# Patient Record
Sex: Male | Born: 1945 | Race: White | Hispanic: No | Smoking: Never smoker
Health system: Southern US, Community
[De-identification: ages and names within clinical notes are randomized; demographics above are authoritative.]

## PROBLEM LIST (undated history)

## (undated) DIAGNOSIS — Z87442 Personal history of urinary calculi: Secondary | ICD-10-CM

## (undated) DIAGNOSIS — S32009A Unspecified fracture of unspecified lumbar vertebra, initial encounter for closed fracture: Secondary | ICD-10-CM

## (undated) DIAGNOSIS — E559 Vitamin D deficiency, unspecified: Secondary | ICD-10-CM

## (undated) DIAGNOSIS — Z8739 Personal history of other diseases of the musculoskeletal system and connective tissue: Secondary | ICD-10-CM

## (undated) DIAGNOSIS — R030 Elevated blood-pressure reading, without diagnosis of hypertension: Secondary | ICD-10-CM

## (undated) DIAGNOSIS — E119 Type 2 diabetes mellitus without complications: Secondary | ICD-10-CM

## (undated) DIAGNOSIS — E785 Hyperlipidemia, unspecified: Secondary | ICD-10-CM

## (undated) HISTORY — DX: Personal history of other diseases of the musculoskeletal system and connective tissue: Z87.39

## (undated) HISTORY — DX: Hyperlipidemia, unspecified: E78.5

## (undated) HISTORY — DX: Type 2 diabetes mellitus without complications: E11.9

## (undated) HISTORY — PX: OTHER SURGICAL HISTORY: SHX169

## (undated) HISTORY — DX: Unspecified fracture of unspecified lumbar vertebra, initial encounter for closed fracture: S32.009A

## (undated) HISTORY — DX: Elevated blood-pressure reading, without diagnosis of hypertension: R03.0

## (undated) HISTORY — DX: Personal history of urinary calculi: Z87.442

## (undated) HISTORY — DX: Vitamin D deficiency, unspecified: E55.9

---

## 1992-11-05 HISTORY — PX: OTHER SURGICAL HISTORY: SHX169

## 2009-09-30 LAB — HEPATIC FUNCTION PANEL: Alkaline Phosphatase: 72 U/L (ref 25–125)

## 2009-09-30 LAB — LIPID PANEL
Cholesterol: 133 mg/dL (ref 0–200)
LDL Cholesterol: 80 mg/dL
Triglycerides: 88 mg/dL (ref 40–160)

## 2009-09-30 LAB — BASIC METABOLIC PANEL: Glucose: 116 mg/dL

## 2009-09-30 LAB — HEMOGLOBIN A1C: Hgb A1c MFr Bld: 6.5 % — AB (ref 4.0–6.0)

## 2010-11-05 HISTORY — PX: GALLBLADDER SURGERY: SHX652

## 2012-12-12 ENCOUNTER — Ambulatory Visit (INDEPENDENT_AMBULATORY_CARE_PROVIDER_SITE_OTHER): Payer: BC Managed Care – PPO | Admitting: Family Medicine

## 2012-12-12 ENCOUNTER — Encounter: Payer: Self-pay | Admitting: Family Medicine

## 2012-12-12 VITALS — BP 175/94 | HR 71 | Ht 72.0 in | Wt 232.0 lb

## 2012-12-12 DIAGNOSIS — IMO0001 Reserved for inherently not codable concepts without codable children: Secondary | ICD-10-CM

## 2012-12-12 DIAGNOSIS — H60399 Other infective otitis externa, unspecified ear: Secondary | ICD-10-CM

## 2012-12-12 DIAGNOSIS — R030 Elevated blood-pressure reading, without diagnosis of hypertension: Secondary | ICD-10-CM

## 2012-12-12 DIAGNOSIS — H6090 Unspecified otitis externa, unspecified ear: Secondary | ICD-10-CM

## 2012-12-12 DIAGNOSIS — E785 Hyperlipidemia, unspecified: Secondary | ICD-10-CM

## 2012-12-12 DIAGNOSIS — H6120 Impacted cerumen, unspecified ear: Secondary | ICD-10-CM

## 2012-12-12 HISTORY — DX: Reserved for inherently not codable concepts without codable children: IMO0001

## 2012-12-12 HISTORY — DX: Hyperlipidemia, unspecified: E78.5

## 2012-12-12 MED ORDER — HYDROCORTISONE-ACETIC ACID 1-2 % OT SOLN: 4.0000 [drp] | Freq: Three times a day (TID) | OTIC | Status: DC

## 2012-12-12 NOTE — Progress Notes (Signed)
CC: Kevin Wood is a 67 y.o. male is here for Establish Care and check right ear   Subjective: HPI:  Kevin Wood 67 year old here to establish care, also receives care at local [place].  Complains of right ear pain present for a little over one week. Associated with mild hearing loss and itching. Feels identical to prior episodes of cerumen impactions. Has treated at home with peroxide and Q-tips. Nothing has made it better or worse. He denies discharge or external ear pain. Denies recent or remote trauma.     Review of Systems - General ROS: negative for - chills, fever, night sweats, weight gain or weight loss Ophthalmic ROS: negative for - decreased vision Psychological ROS: negative for - anxiety or depression ENT ROS: negative for - hearing change, nasal congestion, tinnitus or allergies Hematological and Lymphatic ROS: negative for - bleeding problems, bruising or swollen lymph nodes Breast ROS: negative Respiratory ROS: no cough, shortness of breath, or wheezing Cardiovascular ROS: no chest pain or dyspnea on exertion Gastrointestinal ROS: no abdominal pain, change in bowel habits, or black or bloody stools Genito-Urinary ROS: negative for - genital discharge, genital ulcers, incontinence or abnormal bleeding from genitals Musculoskeletal ROS: negative for - joint pain or muscle pain Neurological ROS: negative for - headaches or memory loss Dermatological ROS: negative for lumps, mole changes, rash and skin lesion changes  Past Medical History  Diagnosis Date  . Hyperlipidemia 12/12/2012     History reviewed. No pertinent family history.   History  Substance Use Topics  . Smoking status: Never Smoker   . Smokeless tobacco: Not on file  . Alcohol Use: 0.5 oz/week    1 drink(s) per week     Objective: Filed Vitals:   12/12/12 1444  BP: 175/94  Pulse: 71    General: Alert and Oriented, No Acute Distress HEENT: Pupils equal, round, reactive to light. Conjunctivae clear.   External ears unremarkable,  smaller than normal external auditory canal bilaterally with cerumen impaction bilaterally.Moist mucous membranes, pharynx without inflammation nor lesions.  Neck supple without palpable lymphadenopathy nor abnormal masses. Lungs: Clear to auscultation bilaterally, no wheezing/ronchi/rales.  Comfortable work of breathing. Good air movement. Cardiac: Regular rate and rhythm. Normal S1/S2.  No murmurs, rubs, nor gallops.   Extremities: No peripheral edema.  Strong peripheral pulses.  Mental Status: No depression, anxiety, nor agitation. Skin: Warm and dry.  Assessment & Plan: Kiam was seen today for establish care and check right ear.  Diagnoses and associated orders for this visit:  Elevated blood pressure  Otitis externa - acetic acid-hydrocortisone (VOSOL-HC) otic solution; Place 4 drops into both ears 3 (three) times daily.  Other Orders - simvastatin (ZOCOR) 10 MG tablet; Take 10 mg by mouth at bedtime. - niacin 500 MG tablet; Take 500 mg by mouth daily with breakfast.    Elevated blood pressure:  Patient has a blood pressure cuff at home and was instructed to measure blood pressures daily and is consistently above 140/90 return as soon as possible for consideration of blood pressure medication. Return in one month and if still elevated will consider starting hydrochlorothiazide.  Otitis externa with cerumen impaction: Moderate amount of cerumen was removed from the ear using water irrigation followed by removal with a curet by myself bilaterally. Eardrums were visualized bilaterally however right side has mild cerumen attached to the tympanic membrane. Patient tolerated procedure well and reports increased ability to hear. Will start above otic solution followed by 3 times a week hydrocortisone and water washings.  asked him to return with any recent blood work from the [place] at his next visit.  No Follow-up on file.

## 2012-12-17 ENCOUNTER — Encounter: Payer: Self-pay | Admitting: *Deleted

## 2012-12-17 ENCOUNTER — Encounter: Payer: Self-pay | Admitting: Family Medicine

## 2012-12-17 DIAGNOSIS — Z87442 Personal history of urinary calculi: Secondary | ICD-10-CM

## 2012-12-17 DIAGNOSIS — Z800 Family history of malignant neoplasm of digestive organs: Secondary | ICD-10-CM | POA: Insufficient documentation

## 2012-12-17 DIAGNOSIS — E559 Vitamin D deficiency, unspecified: Secondary | ICD-10-CM

## 2012-12-17 DIAGNOSIS — E1129 Type 2 diabetes mellitus with other diabetic kidney complication: Secondary | ICD-10-CM | POA: Insufficient documentation

## 2012-12-17 DIAGNOSIS — H6120 Impacted cerumen, unspecified ear: Secondary | ICD-10-CM

## 2012-12-17 DIAGNOSIS — Z8739 Personal history of other diseases of the musculoskeletal system and connective tissue: Secondary | ICD-10-CM

## 2012-12-17 DIAGNOSIS — D214 Benign neoplasm of connective and other soft tissue of abdomen: Secondary | ICD-10-CM | POA: Insufficient documentation

## 2012-12-17 DIAGNOSIS — E119 Type 2 diabetes mellitus without complications: Secondary | ICD-10-CM

## 2012-12-17 HISTORY — DX: Personal history of other diseases of the musculoskeletal system and connective tissue: Z87.39

## 2012-12-17 HISTORY — DX: Vitamin D deficiency, unspecified: E55.9

## 2012-12-17 HISTORY — DX: Personal history of urinary calculi: Z87.442

## 2012-12-17 HISTORY — DX: Type 2 diabetes mellitus without complications: E11.9

## 2012-12-17 LAB — MICROALBUMIN / CREATININE URINE RATIO: .: 16.6

## 2012-12-17 LAB — PSA: .: 0.4

## 2013-07-02 ENCOUNTER — Encounter: Payer: Self-pay | Admitting: Family Medicine

## 2013-07-02 ENCOUNTER — Ambulatory Visit (INDEPENDENT_AMBULATORY_CARE_PROVIDER_SITE_OTHER): Payer: Medicare Other | Admitting: Family Medicine

## 2013-07-02 VITALS — BP 136/82 | HR 68 | Wt 236.0 lb

## 2013-07-02 DIAGNOSIS — Z23 Encounter for immunization: Secondary | ICD-10-CM

## 2013-07-02 DIAGNOSIS — L909 Atrophic disorder of skin, unspecified: Secondary | ICD-10-CM

## 2013-07-02 DIAGNOSIS — L918 Other hypertrophic disorders of the skin: Secondary | ICD-10-CM

## 2013-07-02 DIAGNOSIS — L089 Local infection of the skin and subcutaneous tissue, unspecified: Secondary | ICD-10-CM
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
## 2013-08-11 ENCOUNTER — Telehealth: Payer: Self-pay | Admitting: Family Medicine

## 2013-08-11 ENCOUNTER — Ambulatory Visit (INDEPENDENT_AMBULATORY_CARE_PROVIDER_SITE_OTHER): Payer: Medicare Other | Admitting: Family Medicine

## 2013-08-11 ENCOUNTER — Encounter: Payer: Self-pay | Admitting: Family Medicine

## 2013-08-11 VITALS — BP 145/81 | HR 69 | Wt 235.0 lb

## 2013-08-11 DIAGNOSIS — Z79899 Other long term (current) drug therapy: Secondary | ICD-10-CM

## 2013-08-11 DIAGNOSIS — E785 Hyperlipidemia, unspecified: Secondary | ICD-10-CM

## 2013-08-11 DIAGNOSIS — I10 Essential (primary) hypertension: Secondary | ICD-10-CM | POA: Insufficient documentation

## 2013-08-11 DIAGNOSIS — Z5181 Encounter for therapeutic drug level monitoring: Secondary | ICD-10-CM

## 2013-08-11 DIAGNOSIS — E119 Type 2 diabetes mellitus without complications: Secondary | ICD-10-CM

## 2013-08-11 LAB — LIPID PANEL
Cholesterol: 186 mg/dL (ref 0–200)
LDL Cholesterol: 124 mg/dL — ABNORMAL HIGH (ref 0–99)
Triglycerides: 107 mg/dL (ref <150)

## 2013-08-11 LAB — COMPLETE METABOLIC PANEL WITH GFR
ALT: 20 U/L (ref 0–53)
Albumin: 4.3 g/dL (ref 3.5–5.2)
CO2: 31 mEq/L (ref 19–32)
Calcium: 9.4 mg/dL (ref 8.4–10.5)
Chloride: 101 mEq/L (ref 96–112)
Creat: 0.83 mg/dL (ref 0.50–1.35)
GFR, Est African American: >89 mL/min
Potassium: 4.5 mEq/L (ref 3.5–5.3)

## 2013-08-11 LAB — HEMOGLOBIN A1C: Hgb A1c MFr Bld: 6.5 % — ABNORMAL HIGH (ref <5.7)

## 2013-08-11 MED ORDER — LISINOPRIL-HYDROCHLOROTHIAZIDE 20-12.5 MG PO TABS: 1.0000 | ORAL_TABLET | Freq: Every day | ORAL | Status: DC

## 2013-08-11 MED ORDER — SIMVASTATIN 20 MG PO TABS: 20.0000 mg | ORAL_TABLET | Freq: Every day | ORAL | Status: DC

## 2013-08-11 NOTE — Telephone Encounter (Signed)
Kevin Wood, Will you please let Kevin Wood know that his A1c remains at 6.5 compared to 6.5 last year.  Goal A1c for type 2 diabetes is less than 7 which he has met.  LDL cholesterol is the only abnormal cholesterol parameter and at 124 this is above a goal of less than 100.  I'd encourage him to increase his simvastatin dose, now to 20mg , i've sent an updated Rx to [facility] on [place].  I'll mail him a letter with the exact vaules.  F/U in one month for BP check.
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
## 2013-08-13 NOTE — Telephone Encounter (Signed)
Pt.notified

## 2013-08-24 ENCOUNTER — Encounter (INDEPENDENT_AMBULATORY_CARE_PROVIDER_SITE_OTHER): Payer: Self-pay

## 2013-08-24 ENCOUNTER — Encounter: Payer: Self-pay | Admitting: Family Medicine

## 2013-08-24 ENCOUNTER — Ambulatory Visit (INDEPENDENT_AMBULATORY_CARE_PROVIDER_SITE_OTHER): Payer: Medicare Other | Admitting: Family Medicine

## 2013-08-24 VITALS — BP 133/76 | HR 86 | Wt 232.0 lb

## 2013-08-24 DIAGNOSIS — I10 Essential (primary) hypertension: Secondary | ICD-10-CM

## 2013-08-24 DIAGNOSIS — Z23 Encounter for immunization: Secondary | ICD-10-CM

## 2013-08-24 DIAGNOSIS — Z283 Underimmunization status: Secondary | ICD-10-CM

## 2013-08-24 NOTE — Addendum Note (Signed)
Addended by: Wyline Beady on: 08/24/2013 02:53 PM   Modules accepted: Orders
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
## 2013-11-02 ENCOUNTER — Telehealth: Payer: Self-pay | Admitting: Family Medicine

## 2013-11-02 DIAGNOSIS — E785 Hyperlipidemia, unspecified: Secondary | ICD-10-CM

## 2013-11-02 DIAGNOSIS — I10 Essential (primary) hypertension: Secondary | ICD-10-CM

## 2013-11-02 MED ORDER — LISINOPRIL-HYDROCHLOROTHIAZIDE 20-12.5 MG PO TABS: 1.0000 | ORAL_TABLET | Freq: Every day | ORAL | Status: DC

## 2013-11-02 MED ORDER — SIMVASTATIN 20 MG PO TABS: 20.0000 mg | ORAL_TABLET | Freq: Every day | ORAL | Status: DC

## 2013-11-12 NOTE — Telephone Encounter (Signed)
Refill request

## 2014-01-12 ENCOUNTER — Encounter: Payer: Self-pay | Admitting: Family Medicine

## 2014-01-12 ENCOUNTER — Ambulatory Visit (INDEPENDENT_AMBULATORY_CARE_PROVIDER_SITE_OTHER): Payer: Commercial Managed Care - HMO | Admitting: Family Medicine

## 2014-01-12 VITALS — BP 110/69 | HR 62 | Wt 223.0 lb

## 2014-01-12 DIAGNOSIS — I10 Essential (primary) hypertension: Secondary | ICD-10-CM

## 2014-01-12 DIAGNOSIS — E119 Type 2 diabetes mellitus without complications: Secondary | ICD-10-CM

## 2014-01-12 DIAGNOSIS — E785 Hyperlipidemia, unspecified: Secondary | ICD-10-CM

## 2014-01-12 LAB — POCT UA - MICROALBUMIN
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
## 2014-01-13 LAB — HEMOGLOBIN A1C
Hgb A1c MFr Bld: 6.3 % — ABNORMAL HIGH (ref <5.7)
Mean Plasma Glucose: 134 mg/dL — ABNORMAL HIGH (ref <117)

## 2014-01-25 ENCOUNTER — Encounter: Payer: Self-pay | Admitting: Family Medicine

## 2014-01-25 ENCOUNTER — Ambulatory Visit (INDEPENDENT_AMBULATORY_CARE_PROVIDER_SITE_OTHER): Payer: Commercial Managed Care - HMO | Admitting: Family Medicine

## 2014-01-25 VITALS — BP 116/71 | HR 72 | Temp 98.1°F | Wt 226.0 lb

## 2014-01-25 DIAGNOSIS — J209 Acute bronchitis, unspecified: Secondary | ICD-10-CM

## 2014-01-25 MED ORDER — HYDROCODONE-HOMATROPINE 5-1.5 MG/5ML PO SYRP: 5.0000 mL | ORAL_SOLUTION | Freq: Three times a day (TID) | ORAL | Status: DC | PRN

## 2014-01-25 MED ORDER — PREDNISONE 20 MG PO TABS: ORAL_TABLET | ORAL | Status: AC
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
## 2014-02-08 ENCOUNTER — Ambulatory Visit (INDEPENDENT_AMBULATORY_CARE_PROVIDER_SITE_OTHER): Payer: Commercial Managed Care - HMO | Admitting: Family Medicine

## 2014-02-08 ENCOUNTER — Encounter: Payer: Self-pay | Admitting: Family Medicine

## 2014-02-08 VITALS — BP 133/76 | HR 78 | Temp 97.6°F | Wt 229.0 lb

## 2014-02-08 DIAGNOSIS — A499 Bacterial infection, unspecified: Secondary | ICD-10-CM

## 2014-02-08 DIAGNOSIS — H1089 Other conjunctivitis: Secondary | ICD-10-CM

## 2014-02-08 DIAGNOSIS — H109 Unspecified conjunctivitis: Secondary | ICD-10-CM

## 2014-02-08 DIAGNOSIS — B9689 Other specified bacterial agents as the cause of diseases classified elsewhere: Secondary | ICD-10-CM

## 2014-02-08 MED ORDER — POLYMYXIN B-TRIMETHOPRIM 10000-0.1 UNIT/ML-% OP SOLN: 2.0000 [drp] | OPHTHALMIC | Status: DC
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
## 2014-04-13 LAB — HM DIABETES EYE EXAM

## 2014-08-04 ENCOUNTER — Ambulatory Visit (INDEPENDENT_AMBULATORY_CARE_PROVIDER_SITE_OTHER): Payer: Commercial Managed Care - HMO | Admitting: Family Medicine

## 2014-08-04 ENCOUNTER — Encounter: Payer: Self-pay | Admitting: Family Medicine

## 2014-08-04 VITALS — BP 131/75 | HR 58 | Wt 226.0 lb

## 2014-08-04 DIAGNOSIS — K648 Other hemorrhoids: Secondary | ICD-10-CM

## 2014-08-04 MED ORDER — PSYLLIUM HUSK POWD: Status: DC
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
## 2014-08-30 ENCOUNTER — Other Ambulatory Visit: Payer: Self-pay

## 2014-08-30 MED ORDER — AMBULATORY NON FORMULARY MEDICATION: Status: DC

## 2014-09-13 ENCOUNTER — Encounter: Payer: Self-pay | Admitting: Family Medicine

## 2014-09-14 ENCOUNTER — Encounter: Payer: Self-pay | Admitting: Family Medicine

## 2014-09-17 ENCOUNTER — Encounter: Payer: Self-pay | Admitting: Family Medicine

## 2014-11-01 ENCOUNTER — Encounter: Payer: Self-pay | Admitting: Family Medicine

## 2014-11-01 ENCOUNTER — Ambulatory Visit (INDEPENDENT_AMBULATORY_CARE_PROVIDER_SITE_OTHER): Payer: Commercial Managed Care - HMO | Admitting: Family Medicine

## 2014-11-01 VITALS — BP 128/76 | HR 71 | Temp 98.0°F | Wt 225.0 lb

## 2014-11-01 DIAGNOSIS — A499 Bacterial infection, unspecified: Secondary | ICD-10-CM

## 2014-11-01 DIAGNOSIS — J329 Chronic sinusitis, unspecified: Secondary | ICD-10-CM

## 2014-11-01 DIAGNOSIS — B9689 Other specified bacterial agents as the cause of diseases classified elsewhere: Secondary | ICD-10-CM

## 2014-11-01 MED ORDER — AZITHROMYCIN 250 MG PO TABS: ORAL_TABLET | ORAL | Status: AC
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
## 2014-12-01 ENCOUNTER — Ambulatory Visit (INDEPENDENT_AMBULATORY_CARE_PROVIDER_SITE_OTHER): Payer: Commercial Managed Care - HMO | Admitting: Family Medicine

## 2014-12-01 VITALS — Temp 98.5°F

## 2014-12-01 DIAGNOSIS — Z111 Encounter for screening for respiratory tuberculosis: Secondary | ICD-10-CM

## 2014-12-01 NOTE — Progress Notes (Signed)
   Subjective:    Patient ID: Kevin Wood, male    DOB: 1946/01/23, 69 y.o.   MRN: [id number]  HPI  Kevin Wood is here for PPD placement.   Review of Systems     Objective:   Physical Exam        Assessment & Plan:  Patient tolerated injection well without complications. Patient advised to return in 2-3 days from today.

## 2014-12-02 ENCOUNTER — Telehealth: Payer: Self-pay | Admitting: Family Medicine

## 2014-12-02 NOTE — Telephone Encounter (Signed)
Whoever reads this patient's PPD tomorrow, can you please also check a finger stick A1c and forward the results to me. Dx type 2 diabetes.

## 2014-12-03 ENCOUNTER — Encounter: Payer: Self-pay | Admitting: Family Medicine

## 2014-12-03 ENCOUNTER — Ambulatory Visit (INDEPENDENT_AMBULATORY_CARE_PROVIDER_SITE_OTHER): Payer: Commercial Managed Care - HMO | Admitting: Family Medicine

## 2014-12-03 VITALS — Resp 20

## 2014-12-03 DIAGNOSIS — E119 Type 2 diabetes mellitus without complications: Secondary | ICD-10-CM

## 2014-12-03 LAB — POCT GLYCOSYLATED HEMOGLOBIN (HGB A1C): Hemoglobin A1C: 6.4

## 2014-12-03 LAB — TB SKIN TEST
INDURATION: 0 mm
TB Skin Test: NEGATIVE

## 2014-12-03 NOTE — Progress Notes (Signed)
   Subjective:    Patient ID: Kevin Wood, male    DOB: [birth year], 70 y.o.   MRN: [id number]  HPI   Here for PPD read and A1c per [facility]. Review of Systems     Objective:   Physical Exam        Assessment & Plan:  Charted ppd results on form pt brought in

## 2015-01-23 DIAGNOSIS — Z79899 Other long term (current) drug therapy: Secondary | ICD-10-CM | POA: Diagnosis not present

## 2015-01-23 DIAGNOSIS — R079 Chest pain, unspecified: Secondary | ICD-10-CM | POA: Diagnosis not present

## 2015-01-23 DIAGNOSIS — E780 Pure hypercholesterolemia: Secondary | ICD-10-CM | POA: Diagnosis not present

## 2015-01-23 DIAGNOSIS — I10 Essential (primary) hypertension: Secondary | ICD-10-CM | POA: Diagnosis not present

## 2015-01-23 DIAGNOSIS — R0789 Other chest pain: Secondary | ICD-10-CM | POA: Diagnosis not present

## 2015-01-26 ENCOUNTER — Encounter: Payer: Self-pay | Admitting: Family Medicine

## 2015-01-26 ENCOUNTER — Ambulatory Visit (INDEPENDENT_AMBULATORY_CARE_PROVIDER_SITE_OTHER): Payer: Commercial Managed Care - HMO | Admitting: Family Medicine

## 2015-01-26 VITALS — BP 122/77 | HR 67 | Wt 230.0 lb

## 2015-01-26 DIAGNOSIS — S29011A Strain of muscle and tendon of front wall of thorax, initial encounter: Secondary | ICD-10-CM | POA: Diagnosis not present
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
## 2015-02-15 ENCOUNTER — Encounter: Payer: Self-pay | Admitting: Physician Assistant

## 2015-02-15 ENCOUNTER — Ambulatory Visit (INDEPENDENT_AMBULATORY_CARE_PROVIDER_SITE_OTHER): Payer: Commercial Managed Care - HMO | Admitting: Physician Assistant

## 2015-02-15 VITALS — BP 116/77 | HR 69 | Wt 230.0 lb

## 2015-02-15 DIAGNOSIS — J069 Acute upper respiratory infection, unspecified: Secondary | ICD-10-CM | POA: Diagnosis not present

## 2015-02-15 DIAGNOSIS — H6123 Impacted cerumen, bilateral: Secondary | ICD-10-CM | POA: Diagnosis not present

## 2015-02-15 MED ORDER — AZITHROMYCIN 250 MG PO TABS: ORAL_TABLET | ORAL | Status: DC

## 2015-02-15 NOTE — Progress Notes (Signed)
   Subjective:    Patient ID: Kevin Wood, male    DOB: 1946-05-25, 69 y.o.   MRN: [id number]  HPI  Pt is a 69 yo male who presents to the clinic  With 1 day of tickle in his throat and the start of productive cough. Not tried anything to make better. No fever, chills, body aches, SOB, wheezing, sinus pressure, or ear pain. He does have some sinus drainage. He is in the middle of planning a funeral and very concerned about needing an abx. Pt declines any lung issues or hx of allergies.   Review of Systems  All other systems reviewed and are negative.      Objective:   Physical Exam  Constitutional: He is oriented to person, place, and time. He appears well-developed and well-nourished.  HENT:  Head: Normocephalic and atraumatic.  Right Ear: External ear normal.  Left Ear: External ear normal.  Nose: Nose normal.  Mouth/Throat: No oropharyngeal exudate.  TM"s mostly obstructed by cerumen.   Oropharynx is erythematous with no tonsilar swelling or exudate.   Negative for any sinus tenderness.   Eyes: Conjunctivae are normal. Right eye exhibits no discharge. Left eye exhibits no discharge.  Neck: Normal range of motion. Neck supple.  Cardiovascular: Normal rate, regular rhythm and normal heart sounds.   Pulmonary/Chest: Effort normal and breath sounds normal.  Lymphadenopathy:    He has no cervical adenopathy.  Neurological: He is alert and oriented to person, place, and time.  Psychiatric: He has a normal mood and affect. His behavior is normal.          Assessment & Plan:  Acute upper respiratory infection- discussed likely viral. I would like for patient to try robatussin DM first. Gave HO for other symptomatic care. Strongly discourage starting zpak at this point but to wait. I understand not wanting to feel bad during funeral but everything pointing to viral etiology.   Cerumen impaction- pt will schedule another appt for removal.

## 2015-02-15 NOTE — Patient Instructions (Addendum)
Consider Robatussin DM for cough and congestion.  Ibuprofen for ST.  Salt water gargles.   Upper Respiratory Infection, Adult An upper respiratory infection (URI) is also sometimes known as the common cold. The upper respiratory tract includes the nose, sinuses, throat, trachea, and bronchi. Bronchi are the airways leading to the lungs. Most people improve within 1 week, but symptoms can last up to 2 weeks. A residual cough may last even longer.  CAUSES Many different viruses can infect the tissues lining the upper respiratory tract. The tissues become irritated and inflamed and often become very moist. Mucus production is also common. A cold is contagious. You can easily spread the virus to others by oral contact. This includes kissing, sharing a glass, coughing, or sneezing. Touching your mouth or nose and then touching a surface, which is then touched by another person, can also spread the virus. SYMPTOMS  Symptoms typically develop 1 to 3 days after you come in contact with a cold virus. Symptoms vary from person to person. They may include:  Runny nose.  Sneezing.  Nasal congestion.  Sinus irritation.  Sore throat.  Loss of voice (laryngitis).  Cough.  Fatigue.  Muscle aches.  Loss of appetite.  Headache.  Low-grade fever. DIAGNOSIS  You might diagnose your own cold based on familiar symptoms, since most people get a cold 2 to 3 times a year. Your caregiver can confirm this based on your exam. Most importantly, your caregiver can check that your symptoms are not due to another disease such as strep throat, sinusitis, pneumonia, asthma, or epiglottitis. Blood tests, throat tests, and X-rays are not necessary to diagnose a common cold, but they may sometimes be helpful in excluding other more serious diseases. Your caregiver will decide if any further tests are required. RISKS AND COMPLICATIONS  You may be at risk for a more severe case of the common cold if you smoke  cigarettes, have chronic heart disease (such as heart failure) or lung disease (such as asthma), or if you have a weakened immune system. The very young and very old are also at risk for more serious infections. Bacterial sinusitis, middle ear infections, and bacterial pneumonia can complicate the common cold. The common cold can worsen asthma and chronic obstructive pulmonary disease (COPD). Sometimes, these complications can require emergency medical care and may be life-threatening. PREVENTION  The best way to protect against getting a cold is to practice good hygiene. Avoid oral or hand contact with people with cold symptoms. Wash your hands often if contact occurs. There is no clear evidence that vitamin C, vitamin E, echinacea, or exercise reduces the chance of developing a cold. However, it is always recommended to get plenty of rest and practice good nutrition. TREATMENT  Treatment is directed at relieving symptoms. There is no cure. Antibiotics are not effective, because the infection is caused by a virus, not by bacteria. Treatment may include:  Increased fluid intake. Sports drinks offer valuable electrolytes, sugars, and fluids.  Breathing heated mist or steam (vaporizer or shower).  Eating chicken soup or other clear broths, and maintaining good nutrition.  Getting plenty of rest.  Using gargles or lozenges for comfort.  Controlling fevers with ibuprofen or acetaminophen as directed by your caregiver.  Increasing usage of your inhaler if you have asthma. Zinc gel and zinc lozenges, taken in the first 24 hours of the common cold, can shorten the duration and lessen the severity of symptoms. Pain medicines may help with fever, muscle aches, and  throat pain. A variety of non-prescription medicines are available to treat congestion and runny nose. Your caregiver can make recommendations and may suggest nasal or lung inhalers for other symptoms.  HOME CARE INSTRUCTIONS   Only take  over-the-counter or prescription medicines for pain, discomfort, or fever as directed by your caregiver.  Use a warm mist humidifier or inhale steam from a shower to increase air moisture. This may keep secretions moist and make it easier to breathe.  Drink enough water and fluids to keep your urine clear or pale yellow.  Rest as needed.  Return to work when your temperature has returned to normal or as your caregiver advises. You may need to stay home longer to avoid infecting others. You can also use a face mask and careful hand washing to prevent spread of the virus. SEEK MEDICAL CARE IF:   After the first few days, you feel you are getting worse rather than better.  You need your caregiver's advice about medicines to control symptoms.  You develop chills, worsening shortness of breath, or brown or red sputum. These may be signs of pneumonia.  You develop yellow or brown nasal discharge or pain in the face, especially when you bend forward. These may be signs of sinusitis.  You develop a fever, swollen neck glands, pain with swallowing, or white areas in the back of your throat. These may be signs of strep throat. SEEK IMMEDIATE MEDICAL CARE IF:   You have a fever.  You develop severe or persistent headache, ear pain, sinus pain, or chest pain.  You develop wheezing, a prolonged cough, cough up blood, or have a change in your usual mucus (if you have chronic lung disease).  You develop sore muscles or a stiff neck. Document Released: 04/17/2001 Document Revised: 01/14/2012 Document Reviewed: 01/27/2014 [facility] Patient Information 2015 [place], [place]. This information is not intended to replace advice given to you by your health care provider. Make sure you discuss any questions you have with your health care provider.

## 2015-02-21 ENCOUNTER — Ambulatory Visit (INDEPENDENT_AMBULATORY_CARE_PROVIDER_SITE_OTHER): Payer: Commercial Managed Care - HMO | Admitting: Family Medicine

## 2015-02-21 ENCOUNTER — Encounter: Payer: Self-pay | Admitting: Family Medicine

## 2015-02-21 VITALS — BP 99/63 | HR 67 | Ht 72.0 in | Wt 226.0 lb

## 2015-02-21 DIAGNOSIS — K644 Residual hemorrhoidal skin tags: Secondary | ICD-10-CM

## 2015-02-21 DIAGNOSIS — K648 Other hemorrhoids: Secondary | ICD-10-CM | POA: Diagnosis not present

## 2015-02-21 DIAGNOSIS — H6123 Impacted cerumen, bilateral: Secondary | ICD-10-CM

## 2015-02-21 DIAGNOSIS — H9313 Tinnitus, bilateral: Secondary | ICD-10-CM | POA: Diagnosis not present

## 2015-02-21 MED ORDER — NEOMYCIN-POLYMYXIN-HC 1 % OT SOLN: OTIC | Status: AC

## 2015-02-21 NOTE — Patient Instructions (Addendum)
Lipoflavonoid is a over the counter capsule that works to reduce tinnitus for some patients.  "[place]" [employer]: This can be used as background noise to help hide tinnitus / ringing in the ears.
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
## 2015-03-03 ENCOUNTER — Ambulatory Visit (INDEPENDENT_AMBULATORY_CARE_PROVIDER_SITE_OTHER): Payer: Commercial Managed Care - HMO | Admitting: Family Medicine

## 2015-03-03 ENCOUNTER — Encounter: Payer: Self-pay | Admitting: Family Medicine

## 2015-03-03 ENCOUNTER — Ambulatory Visit (INDEPENDENT_AMBULATORY_CARE_PROVIDER_SITE_OTHER): Payer: Commercial Managed Care - HMO

## 2015-03-03 VITALS — BP 131/82 | HR 70 | Ht 72.0 in | Wt 227.0 lb

## 2015-03-03 DIAGNOSIS — E785 Hyperlipidemia, unspecified: Secondary | ICD-10-CM

## 2015-03-03 DIAGNOSIS — E119 Type 2 diabetes mellitus without complications: Secondary | ICD-10-CM

## 2015-03-03 DIAGNOSIS — H6122 Impacted cerumen, left ear: Secondary | ICD-10-CM

## 2015-03-03 DIAGNOSIS — X58XXXA Exposure to other specified factors, initial encounter: Secondary | ICD-10-CM

## 2015-03-03 DIAGNOSIS — S32010S Wedge compression fracture of first lumbar vertebra, sequela: Secondary | ICD-10-CM | POA: Diagnosis not present

## 2015-03-03 DIAGNOSIS — Z23 Encounter for immunization: Secondary | ICD-10-CM

## 2015-03-03 DIAGNOSIS — M2578 Osteophyte, vertebrae: Secondary | ICD-10-CM | POA: Diagnosis not present

## 2015-03-03 DIAGNOSIS — M545 Low back pain: Secondary | ICD-10-CM

## 2015-03-03 DIAGNOSIS — S32010A Wedge compression fracture of first lumbar vertebra, initial encounter for closed fracture: Secondary | ICD-10-CM | POA: Diagnosis not present

## 2015-03-03 DIAGNOSIS — M47896 Other spondylosis, lumbar region: Secondary | ICD-10-CM

## 2015-03-03 DIAGNOSIS — Z125 Encounter for screening for malignant neoplasm of prostate: Secondary | ICD-10-CM | POA: Diagnosis not present

## 2015-03-03 DIAGNOSIS — Z0000 Encounter for general adult medical examination without abnormal findings: Secondary | ICD-10-CM

## 2015-03-03 LAB — LIPID PANEL
Cholesterol: 179 mg/dL (ref 0–200)
HDL: 42 mg/dL (ref >=40)
LDL CALC: 109 mg/dL — AB (ref 0–99)
TRIGLYCERIDES: 140 mg/dL (ref <150)
Total CHOL/HDL Ratio: 4.3 Ratio
VLDL: 28 mg/dL (ref 0–40)

## 2015-03-03 LAB — COMPLETE METABOLIC PANEL WITH GFR
ALT: 21 U/L (ref 0–53)
AST: 19 U/L (ref 0–37)
Albumin: 4.2 g/dL (ref 3.5–5.2)
Alkaline Phosphatase: 62 U/L (ref 39–117)
BUN: 20 mg/dL (ref 6–23)
CO2: 28 meq/L (ref 19–32)
CREATININE: 0.95 mg/dL (ref 0.50–1.35)
Calcium: 9.3 mg/dL (ref 8.4–10.5)
Chloride: 102 mEq/L (ref 96–112)
GFR, Est Non African American: 83 mL/min
Glucose, Bld: 129 mg/dL — ABNORMAL HIGH (ref 70–99)
POTASSIUM: 4.1 meq/L (ref 3.5–5.3)
SODIUM: 142 meq/L (ref 135–145)
TOTAL PROTEIN: 7.1 g/dL (ref 6.0–8.3)
Total Bilirubin: 0.7 mg/dL (ref 0.2–1.2)

## 2015-03-03 LAB — CBC
HCT: 46.8 % (ref 39.0–52.0)
Hemoglobin: 16.2 g/dL (ref 13.0–17.0)
MCH: 29.5 pg (ref 26.0–34.0)
MCHC: 34.6 g/dL (ref 30.0–36.0)
MCV: 85.1 fL (ref 78.0–100.0)
MPV: 10.7 fL (ref 8.6–12.4)
Platelets: 172 10*3/uL (ref 150–400)
RBC: 5.5 MIL/uL (ref 4.22–5.81)
RDW: 13.8 % (ref 11.5–15.5)
WBC: 4.2 10*3/uL (ref 4.0–10.5)

## 2015-03-03 LAB — HEMOGLOBIN A1C
HEMOGLOBIN A1C: 6.6 % — AB (ref <5.7)
Mean Plasma Glucose: 143 mg/dL — ABNORMAL HIGH (ref <117)

## 2015-03-03 NOTE — Patient Instructions (Signed)
Dr. Linzie Criss's General Advice Following Your Complete Physical Exam  The Benefits of Regular Exercise: Unless you suffer from an uncontrolled cardiovascular condition, studies strongly suggest that regular exercise and physical activity will add to both the quality and length of your life.  The World Health Organization recommends 150 minutes of moderate intensity aerobic activity every week.  This is best split over 3-4 days a week, and can be as simple as a brisk walk for just over 35 minutes "most days of the week".  This type of exercise has been shown to lower LDL-Cholesterol, lower average blood sugars, lower blood pressure, lower cardiovascular disease risk, improve memory, and increase one's overall sense of wellbeing.  The addition of anaerobic (or "strength training") exercises offers additional benefits including but not limited to increased metabolism, prevention of osteoporosis, and improved overall cholesterol levels.  How Can I Strive For A Low-Fat Diet?: Current guidelines recommend that 25-35 percent of your daily energy (food) intake should come from fats.  One might ask how can this be achieved without having to dissect each meal on a daily basis?  Switch to skim or 1% milk instead of whole milk.  Focus on lean meats such as ground turkey, fresh fish, baked chicken, and lean cuts of beef as your source of dietary protein.  Limit saturated fat consumption to less than 10% of your daily caloric intake.  Limit trans fatty acid consumption primarily by limiting synthetic trans fats such as partially hydrogenated oils (Ex: fried fast foods).  Substitute olive or vegetable oil for solid fats where possible.  Moderation of Salt Intake: Provided you don't carry a diagnosis of congestive heart failure nor renal failure, I recommend a daily allowance of no more than 2300 mg of salt (sodium).  Keeping under this daily goal is associated with a decreased risk of cardiovascular events, creeping  above it can lead to elevated blood pressures and increases your risk of cardiovascular events.  Milligrams (mg) of salt is listed on all nutrition labels, and your daily intake can add up faster than you think.  Most canned and frozen dinners can pack in over half your daily salt allowance in one meal.    Lifestyle Health Risks: Certain lifestyle choices carry specific health risks.  As you may already know, tobacco use has been associated with increasing one's risk of cardiovascular disease, pulmonary disease, numerous cancers, among many other issues.  What you may not know is that there are medications and nicotine replacement strategies that can more than double your chances of successfully quitting.  I would be thrilled to help manage your quitting strategy if you currently use tobacco products.  When it comes to alcohol use, I've yet to find an "ideal" daily allowance.  Provided an individual does not have a medical condition that is exacerbated by alcohol consumption, general guidelines determine "safe drinking" as no more than two standard drinks for a man or no more than one standard drink for a male per day.  However, much debate still exists on whether any amount of alcohol consumption is technically "safe".  My general advice, keep alcohol consumption to a minimum for general health promotion.  If you or others believe that alcohol, tobacco, or recreational drug use is interfering with your life, I would be happy to provide confidential counseling regarding treatment options.  General "Over The Counter" Nutrition Advice: Postmenopausal women should aim for a daily calcium intake of 1200 mg, however a significant portion of this might already be   provided by diets including milk, yogurt, cheese, and other dairy products.  Vitamin D has been shown to help preserve bone density, prevent fatigue, and has even been shown to help reduce falls in the elderly.  Ensuring a daily intake of 800 Units of  Vitamin D is a good place to start to enjoy the above benefits, we can easily check your Vitamin D level to see if you'd potentially benefit from supplementation beyond 800 Units a day.  Folic Acid intake should be of particular concern to women of childbearing age.  Daily consumption of 400-800 mcg of Folic Acid is recommended to minimize the chance of spinal cord defects in a fetus should pregnancy occur.    For many adults, accidents still remain one of the most common culprits when it comes to cause of death.  Some of the simplest but most effective preventitive habits you can adopt include regular seatbelt use, proper helmet use, securing firearms, and regularly testing your smoke and carbon monoxide detectors.  Jada Kuhnert B. Ory Elting DO [facility] [address], [address] [place], [place] [address] Phone: [phone]  
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
## 2015-03-04 ENCOUNTER — Telehealth: Payer: Self-pay | Admitting: Family Medicine

## 2015-03-04 DIAGNOSIS — E785 Hyperlipidemia, unspecified: Secondary | ICD-10-CM

## 2015-03-04 DIAGNOSIS — M4306 Spondylolysis, lumbar region: Secondary | ICD-10-CM | POA: Insufficient documentation

## 2015-03-04 LAB — PSA, MEDICARE: PSA: 0.49 ng/mL (ref <=4.00)

## 2015-03-04 MED ORDER — METHOCARBAMOL 500 MG PO TABS: 500.0000 mg | ORAL_TABLET | Freq: Three times a day (TID) | ORAL | Status: DC | PRN

## 2015-03-04 MED ORDER — SIMVASTATIN 40 MG PO TABS: 40.0000 mg | ORAL_TABLET | Freq: Every day | ORAL | Status: DC

## 2015-03-04 NOTE — Telephone Encounter (Signed)
Pt.notified
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
## 2015-03-07 ENCOUNTER — Other Ambulatory Visit: Payer: Self-pay | Admitting: Family Medicine

## 2015-03-07 DIAGNOSIS — E785 Hyperlipidemia, unspecified: Secondary | ICD-10-CM

## 2015-03-07 MED ORDER — SIMVASTATIN 40 MG PO TABS: 40.0000 mg | ORAL_TABLET | Freq: Every day | ORAL | Status: DC

## 2015-03-25 ENCOUNTER — Telehealth: Payer: Self-pay | Admitting: Family Medicine

## 2015-03-25 DIAGNOSIS — K644 Residual hemorrhoidal skin tags: Secondary | ICD-10-CM

## 2015-03-25 NOTE — Telephone Encounter (Signed)
Needs GI referral for hemorrhoids

## 2015-04-08 LAB — VITAMIN D 25 HYDROXY (VIT D DEFICIENCY, FRACTURES): VIT D 25 HYDROXY: 48.74

## 2015-04-08 LAB — LIPID PANEL
HDL: 37 mg/dL (ref 35–70)
LDL CALC: 77 mg/dL
Triglycerides: 103 mg/dL (ref 40–160)

## 2015-04-08 LAB — BASIC METABOLIC PANEL: Creatinine: 1.1 mg/dL (ref 0.6–1.3)

## 2015-04-12 ENCOUNTER — Encounter: Payer: Self-pay | Admitting: Family Medicine

## 2015-04-12 DIAGNOSIS — K625 Hemorrhage of anus and rectum: Secondary | ICD-10-CM | POA: Diagnosis not present

## 2015-04-12 DIAGNOSIS — K648 Other hemorrhoids: Secondary | ICD-10-CM | POA: Insufficient documentation

## 2015-04-12 DIAGNOSIS — K5900 Constipation, unspecified: Secondary | ICD-10-CM | POA: Diagnosis not present

## 2015-05-24 ENCOUNTER — Telehealth: Payer: Self-pay | Admitting: *Deleted

## 2015-05-24 NOTE — Telephone Encounter (Signed)
Pt called and states he received a bill (assuming from GI doc we referred him to ) because there was not a referral that was authorized. Mardene Celeste can you please check on this? I see that the referral was sent in May and it says authorization sent to [facility].

## 2015-05-25 NOTE — Telephone Encounter (Signed)
I faxed authorization for visit over to [facility]. Pt aware.

## 2015-07-04 DIAGNOSIS — K648 Other hemorrhoids: Secondary | ICD-10-CM | POA: Diagnosis not present

## 2015-07-25 DIAGNOSIS — K648 Other hemorrhoids: Secondary | ICD-10-CM | POA: Diagnosis not present

## 2015-09-13 ENCOUNTER — Encounter: Payer: Self-pay | Admitting: Family Medicine

## 2015-09-13 ENCOUNTER — Ambulatory Visit (INDEPENDENT_AMBULATORY_CARE_PROVIDER_SITE_OTHER): Payer: Commercial Managed Care - HMO | Admitting: Family Medicine

## 2015-09-13 VITALS — BP 116/74 | HR 76 | Wt 226.0 lb

## 2015-09-13 DIAGNOSIS — J208 Acute bronchitis due to other specified organisms: Secondary | ICD-10-CM | POA: Diagnosis not present

## 2015-09-13 DIAGNOSIS — I10 Essential (primary) hypertension: Secondary | ICD-10-CM

## 2015-09-13 MED ORDER — PREDNISONE 20 MG PO TABS: ORAL_TABLET | ORAL | Status: AC

## 2015-09-13 MED ORDER — LISINOPRIL-HYDROCHLOROTHIAZIDE 20-12.5 MG PO TABS: 1.0000 | ORAL_TABLET | Freq: Every day | ORAL | Status: DC

## 2015-09-13 NOTE — Progress Notes (Signed)
CC: Kevin Wood is a 69 y.o. male is here for Possible Bronchitis   Subjective: HPI:  Mildly productive cough present ever since Thursday of last week. Slightly improves with over-the-counter cough syrup. Present all hours today but worse when lying down. Has not been getting better or worse since onset. There is an element of loss of voice but this is improving on its own. Accompanied by sensation of postnasal drip but denies any facial pressure or headaches. Other than the above he states he feels great. He denies wheezing, shortness of breath, blood in sputum, chest pain, fever, chills or GI disturbance   Review Of Systems Outlined In HPI  Past Medical History  Diagnosis Date  . Hyperlipidemia 12/12/2012  . Lumbar vertebral fracture ([place]) [place]  . Type 2 diabetes mellitus ([place]) 12/17/2012  . Elevated blood pressure 12/12/2012  . History of nephrolithiasis 12/17/2012  . Unspecified vitamin D deficiency 12/17/2012    Outside Records   . H/O degenerative disc disease 12/17/2012    Past Surgical History  Procedure Laterality Date  . Gallbladder surgery  2012  . Colon cancer  father  . C spine fustion  1994   Family History  Problem Relation Age of Onset  . Cancer Father     colon cancer    Social History   Social History  . Marital Status: Married    Spouse Name: N/A  . Number of Children: N/A  . Years of Education: N/A   Occupational History  . Not on file.   Social History Main Topics  . Smoking status: Never Smoker   . Smokeless tobacco: Not on file  . Alcohol Use: 0.5 oz/week    1 drink(s) per week  . Drug Use: No  . Sexual Activity: Yes   Other Topics Concern  . Not on file   Social History Narrative     Objective: BP 116/74 mmHg  Pulse 76  Wt 226 lb (102.513 kg)  General: Alert and Oriented, No Acute Distress HEENT: Pupils equal, round, reactive to light. Conjunctivae clear.  External ears unremarkable, canals clear with intact TMs with  appropriate landmarks.  Middle ear appears open without effusion. Pink inferior turbinates.  Moist mucous membranes, pharynx without inflammation nor lesions.  Neck supple without palpable lymphadenopathy nor abnormal masses. Lungs: Clear to auscultation bilaterally, no wheezing/ronchi/rales.  Comfortable work of breathing. Good air movement. Extremities: No peripheral edema.  Strong peripheral pulses.  Mental Status: No depression, anxiety, nor agitation. Skin: Warm and dry.  Assessment & Plan: Kevin Wood was seen today for possible bronchitis.  Diagnoses and all orders for this visit:  Essential hypertension, benign -     lisinopril-hydrochlorothiazide (PRINZIDE,ZESTORETIC) 20-12.5 MG tablet; Take 1 tablet by mouth daily.  Acute bronchitis due to other specified organisms -     predniSONE (DELTASONE) 20 MG tablet; Three tabs at once daily for five days.   Viral bronchitis: Start prednisone since the cough is bothering him along with postnasal drip. No indication for antibiotic at this time He would like [employer] to take over his blood pressure medication since it will be free and the [place] still charges him $9, prescription has been sent in.  Return if symptoms worsen or fail to improve.

## 2015-09-19 DIAGNOSIS — K648 Other hemorrhoids: Secondary | ICD-10-CM | POA: Diagnosis not present

## 2015-09-20 ENCOUNTER — Telehealth: Payer: Self-pay

## 2015-09-20 MED ORDER — AZITHROMYCIN 250 MG PO TABS: ORAL_TABLET | ORAL | Status: AC

## 2015-09-20 NOTE — Telephone Encounter (Signed)
Azithromycin has been sent to [facility]

## 2015-09-20 NOTE — Telephone Encounter (Signed)
Pt states that the prednisone that was Rx 'd for bronchitis didn't work for him.  He is still coughing and coughing up phlegm. He is asking for something stronger to be sent to the pharmacy.

## 2015-09-20 NOTE — Telephone Encounter (Signed)
Pt advised.

## 2015-12-01 ENCOUNTER — Ambulatory Visit (INDEPENDENT_AMBULATORY_CARE_PROVIDER_SITE_OTHER): Payer: Commercial Managed Care - HMO | Admitting: Family Medicine

## 2015-12-01 ENCOUNTER — Encounter: Payer: Self-pay | Admitting: Family Medicine

## 2015-12-01 VITALS — BP 138/81 | HR 64 | Wt 230.0 lb

## 2015-12-01 DIAGNOSIS — K648 Other hemorrhoids: Secondary | ICD-10-CM | POA: Diagnosis not present

## 2015-12-01 DIAGNOSIS — Z1211 Encounter for screening for malignant neoplasm of colon: Secondary | ICD-10-CM

## 2015-12-01 NOTE — Progress Notes (Signed)
CC: Kevin Wood is a 70 y.o. male is here for Hemorrhoids   Subjective: HPI:  3-4 times a month he notices bright red blood in his stool. He has known hemorrhoids that have been treated with ligation. Nothing seems to make his symptoms better or worse. He denies rectal pain but occasionally has rectal pressure when he notices the blood in his stool. Episodes last 1 or 2 days. He denies any abdominal pain or unintentional weight loss. Denies shortness of breath or chest pain. He wants to know if there is a procedure other than colonoscopy that can be used to check for colon cancer.   Review Of Systems Outlined In HPI  Past Medical History  Diagnosis Date  . Hyperlipidemia 12/12/2012  . Lumbar vertebral fracture ([place]) [place]  . Type 2 diabetes mellitus ([place]) 12/17/2012  . Elevated blood pressure 12/12/2012  . History of nephrolithiasis 12/17/2012  . Unspecified vitamin D deficiency 12/17/2012    Outside Records   . H/O degenerative disc disease 12/17/2012    Past Surgical History  Procedure Laterality Date  . Gallbladder surgery  2012  . Colon cancer  father  . C spine fustion  1994   Family History  Problem Relation Age of Onset  . Cancer Father     colon cancer    Social History   Social History  . Marital Status: Married    Spouse Name: N/A  . Number of Children: N/A  . Years of Education: N/A   Occupational History  . Not on file.   Social History Main Topics  . Smoking status: Never Smoker   . Smokeless tobacco: Not on file  . Alcohol Use: 0.5 oz/week    1 drink(s) per week  . Drug Use: No  . Sexual Activity: Yes   Other Topics Concern  . Not on file   Social History Narrative     Objective: BP 138/81 mmHg  Pulse 64  Wt 230 lb (104.327 kg)  Vital signs reviewed. General: Alert and Oriented, No Acute Distress HEENT: Pupils equal, round, reactive to light. Conjunctivae clear.  External ears unremarkable.  Moist mucous membranes. Lungs:  Clear and comfortable work of breathing, speaking in full sentences without accessory muscle use. Cardiac: Regular rate and rhythm.  Neuro: CN II-XII grossly intact, gait normal. Extremities: No peripheral edema.  Strong peripheral pulses.  Mental Status: No depression, anxiety, nor agitation. Logical though process. Skin: Warm and dry. Assessment & Plan: Chaitanya was seen today for hemorrhoids.  Diagnoses and all orders for this visit:  Internal hemorrhoids  Screening for colon cancer   Ordering cologuard to screen for colon cancer. Discussed that if this is positive he'll have to go through with getting a colonoscopy but if it's negative it can provide reassurance for up to 3 years.  Return if symptoms worsen or fail to improve.

## 2015-12-20 DIAGNOSIS — Z1212 Encounter for screening for malignant neoplasm of rectum: Secondary | ICD-10-CM | POA: Diagnosis not present

## 2015-12-20 DIAGNOSIS — Z1211 Encounter for screening for malignant neoplasm of colon: Secondary | ICD-10-CM | POA: Diagnosis not present

## 2015-12-28 ENCOUNTER — Ambulatory Visit (INDEPENDENT_AMBULATORY_CARE_PROVIDER_SITE_OTHER): Payer: Commercial Managed Care - HMO | Admitting: Family Medicine

## 2015-12-28 ENCOUNTER — Encounter: Payer: Self-pay | Admitting: Family Medicine

## 2015-12-28 VITALS — BP 143/82 | HR 71 | Wt 227.0 lb

## 2015-12-28 DIAGNOSIS — H918X1 Other specified hearing loss, right ear: Secondary | ICD-10-CM | POA: Diagnosis not present

## 2015-12-28 DIAGNOSIS — H6121 Impacted cerumen, right ear: Secondary | ICD-10-CM

## 2015-12-28 DIAGNOSIS — L918 Other hypertrophic disorders of the skin: Secondary | ICD-10-CM | POA: Diagnosis not present

## 2015-12-28 DIAGNOSIS — H6122 Impacted cerumen, left ear: Secondary | ICD-10-CM

## 2015-12-28 DIAGNOSIS — L089 Local infection of the skin and subcutaneous tissue, unspecified: Secondary | ICD-10-CM | POA: Diagnosis not present

## 2015-12-28 DIAGNOSIS — H918X2 Other specified hearing loss, left ear: Secondary | ICD-10-CM

## 2015-12-28 NOTE — Progress Notes (Signed)
CC: Kevin Wood is a 70 y.o. male is here for Cerumen Impaction   Subjective: HPI:  Chronic bilateral hearing loss, fluctuates on a monthly basis.  Improves with irrigation of the ear canals.  Has a hearing test on Friday and wants all ear wax removed.  Hearing loss currently mild, bilateral, all frequencies. No nasal congestion, cough, sore throat, headache, nor any other sensory disturbance.  Mass on left neck/shoulder, painful when wearing a shirt. Getting larger and more annoying. Mild in severity. No skin changes elsewhere. No current interventions, present for at least 3 months.      Review Of Systems Outlined In HPI  Past Medical History  Diagnosis Date  . Hyperlipidemia 12/12/2012  . Lumbar vertebral fracture ([place]) [place]  . Type 2 diabetes mellitus ([place]) 12/17/2012  . Elevated blood pressure 12/12/2012  . History of nephrolithiasis 12/17/2012  . Unspecified vitamin D deficiency 12/17/2012    Outside Records   . H/O degenerative disc disease 12/17/2012    Past Surgical History  Procedure Laterality Date  . Gallbladder surgery  2012  . Colon cancer  father  . C spine fustion  1994   Family History  Problem Relation Age of Onset  . Cancer Father     colon cancer    Social History   Social History  . Marital Status: Married    Spouse Name: N/A  . Number of Children: N/A  . Years of Education: N/A   Occupational History  . Not on file.   Social History Main Topics  . Smoking status: Never Smoker   . Smokeless tobacco: Not on file  . Alcohol Use: 0.5 oz/week    1 drink(s) per week  . Drug Use: No  . Sexual Activity: Yes   Other Topics Concern  . Not on file   Social History Narrative     Objective: BP 143/82 mmHg  Pulse 71  Wt 227 lb (102.967 kg)  General: Alert and Oriented, No Acute Distress HEENT: Pupils equal, round, reactive to light. Conjunctivae clear.  External ears unremarkable, initially there was a bilateral cerumen  impaction, after irrigation/curritage canals clear with intact TMs with appropriate landmarks.  Middle ear appears open without effusion. Pink inferior turbinates.  Moist mucous membranes, pharynx without inflammation nor lesions.  Neck supple without palpable lymphadenopathy nor abnormal masses. Lungs: clear and comfortable work of breathing Cardiac: Regular rate and rhythm.  Extremities: No peripheral edema.  Strong peripheral pulses.  Mental Status: No depression, anxiety, nor agitation. Skin: Warm and dry. 57mm inflammed skin tag on left base of neck  Assessment & Plan: Teddy was seen today for cerumen impaction.  Diagnoses and all orders for this visit:  Hearing loss due to cerumen impaction, left  Hearing loss due to cerumen impaction, right  Inflamed skin tag   Cryotherapy Procedure Note  Pre-operative Diagnosis: inflamed skin tag  Post-operative Diagnosis: same  Locations: left base of neck  Indications: pain  Anesthesia: none  Procedure Details  History of allergy to iodine: no. Pacemaker? no.  Patient informed of risks (permanent scarring, infection, light or dark discoloration, bleeding, infection, weakness, numbness and recurrence of the lesion) and benefits of the procedure and verbal informed consent obtained.  The areas are treated with liquid nitrogen therapy, frozen until ice ball extended 2 mm beyond lesion, allowed to thaw, and treated again. The patient tolerated procedure well.  The patient was instructed on post-op care, warned that there may be blister formation, redness and  pain. Recommend OTC analgesia as needed for pain.  Condition: Stable  Complications: none.  Plan: 1. Instructed to keep the area dry and covered for 24-48h and clean thereafter. 2. Warning signs of infection were reviewed.   3. Recommended that the patient use OTC analgesics as needed for pain.  4. Return PRN   Indication: Cerumen impaction of the ears Medical necessity  statement: On physical examination, cerumen impairs clinically significant portions of the external auditory canal, and tympanic membrane. Noted obstructive, copious cerumen that cannot be removed without magnification and instrumentations requiring physician skills Consent: Discussed benefits and risks of procedure and verbal consent obtained Procedure: Patient was prepped for the procedure. Utilized an otoscope to assess and take note of the ear canal, the tympanic membrane, and the presence, amount, and placement of the cerumen. Gentle water irrigation and soft plastic curette was utilized to remove cerumen.  Post procedure examination: shows cerumen was completely removed. Patient tolerated procedure well. The patient is made aware that they may experience temporary vertigo, temporary hearing loss, and temporary discomfort. If these symptom last for more than 24 hours to call the clinic or proceed to the ED.      Return if symptoms worsen or fail to improve.

## 2016-01-02 ENCOUNTER — Telehealth: Payer: Self-pay | Admitting: Family Medicine

## 2016-01-02 LAB — COLOGUARD: COLOGUARD: NEGATIVE

## 2016-01-02 NOTE — Telephone Encounter (Signed)
Pt.notified

## 2016-01-02 NOTE — Telephone Encounter (Signed)
Will you please let patient know that his cologuard test was normal, I'd recommend repeating this in three years to screen for colon cancer.

## 2016-01-05 ENCOUNTER — Encounter: Payer: Self-pay | Admitting: Family Medicine

## 2016-02-27 ENCOUNTER — Ambulatory Visit (INDEPENDENT_AMBULATORY_CARE_PROVIDER_SITE_OTHER): Payer: Commercial Managed Care - HMO | Admitting: Family Medicine

## 2016-02-27 ENCOUNTER — Encounter: Payer: Self-pay | Admitting: Family Medicine

## 2016-02-27 VITALS — BP 122/78 | HR 69 | Wt 226.0 lb

## 2016-02-27 DIAGNOSIS — M25562 Pain in left knee: Secondary | ICD-10-CM | POA: Diagnosis not present

## 2016-02-27 DIAGNOSIS — M4306 Spondylolysis, lumbar region: Secondary | ICD-10-CM

## 2016-02-27 MED ORDER — PREDNISONE 20 MG PO TABS: ORAL_TABLET | ORAL | Status: AC

## 2016-02-27 NOTE — Progress Notes (Signed)
CC: Kevin Wood is a 70 y.o. male is here for Back Pain and Knee Pain   Subjective: HPI:  1 week ago from Thursday he had a sudden onset of low back pain is described as a ache involving both left and right lower back along with the midline of the back. It's occasionally radiating down the leg but never both legs at once. He describes the radiation as a achiness but not necessarily pain. Symptoms are fluctuating between 1/10-5/10. Worse with bending forward improves with lying down flat. No benefit from Robaxin. He denies any spasm component. He denies any weakness in the lower extremities or bowel/bladder incontinence. Over-the-counter nonsteroidal anti-inflammatories have not been helpful.  Couple months ago he fell in the shower and ever since then he's had some mild pain on the medial aspect of the knee. It's worse with flexion for more than a few hours. After he gets up and walks around it goes away within a few seconds. He denies any swelling redness warmth or mechanical symptoms involving the knee.   Review Of Systems Outlined In HPI  Past Medical History  Diagnosis Date  . Hyperlipidemia 12/12/2012  . Lumbar vertebral fracture ([place]) [place]  . Type 2 diabetes mellitus ([place]) 12/17/2012  . Elevated blood pressure 12/12/2012  . History of nephrolithiasis 12/17/2012  . Unspecified vitamin D deficiency 12/17/2012    Outside Records   . H/O degenerative disc disease 12/17/2012    Past Surgical History  Procedure Laterality Date  . Gallbladder surgery  2012  . Colon cancer  father  . C spine fustion  1994   Family History  Problem Relation Age of Onset  . Cancer Father     colon cancer    Social History   Social History  . Marital Status: Married    Spouse Name: N/A  . Number of Children: N/A  . Years of Education: N/A   Occupational History  . Not on file.   Social History Main Topics  . Smoking status: Never Smoker   . Smokeless tobacco: Not on file  .  Alcohol Use: 0.5 oz/week    1 drink(s) per week  . Drug Use: No  . Sexual Activity: Yes   Other Topics Concern  . Not on file   Social History Narrative     Objective: BP 122/78 mmHg  Pulse 69  Wt 226 lb (102.513 kg)  General: Alert and Oriented, No Acute Distress HEENT: Pupils equal, round, reactive to light. Conjunctivae clear.  Moist mucous membranes Lungs: Clear to auscultation bilaterally, no wheezing/ronchi/rales.  Comfortable work of breathing. Good air movement. Cardiac: Regular rate and rhythm. Normal S1/S2.  No murmurs, rubs, nor gallops.   Back: No midline spinous process tenderness, pain reproduced with flexion of the lumbar spine, no weakness in the lower extremities Extremities: No peripheral edema.  Strong peripheral pulses. No pain with valgus or varus stress, no pain with palpation of the left knee Mental Status: No depression, anxiety, nor agitation. Skin: Warm and dry.  Assessment & Plan: Daud was seen today for back pain and knee pain.  Diagnoses and all orders for this visit:  Spondylolysis, lumbar region -     predniSONE (DELTASONE) 20 MG tablet; Three tabs daily days 1-3, two tabs daily days 4-6, one tab daily days 7-9, half tab daily days 10-13.  Left knee pain  Spondylosis and back pain: Start prednisone taper for radicular pain if no better after a week and a half will  order MRI for consideration of epidurals. Left knee pain: High possibility that it will get better with prednisone  No Follow-up on file.

## 2016-03-09 IMAGING — CR DG LUMBAR SPINE COMPLETE 4+V
5 series · 5 of 5 positions shown · non-contrast
Comparison: None.

CLINICAL DATA: Chronic low back pain without sciatica

EXAM:
LUMBAR SPINE - COMPLETE 4+ VIEW

[l-spine ap]
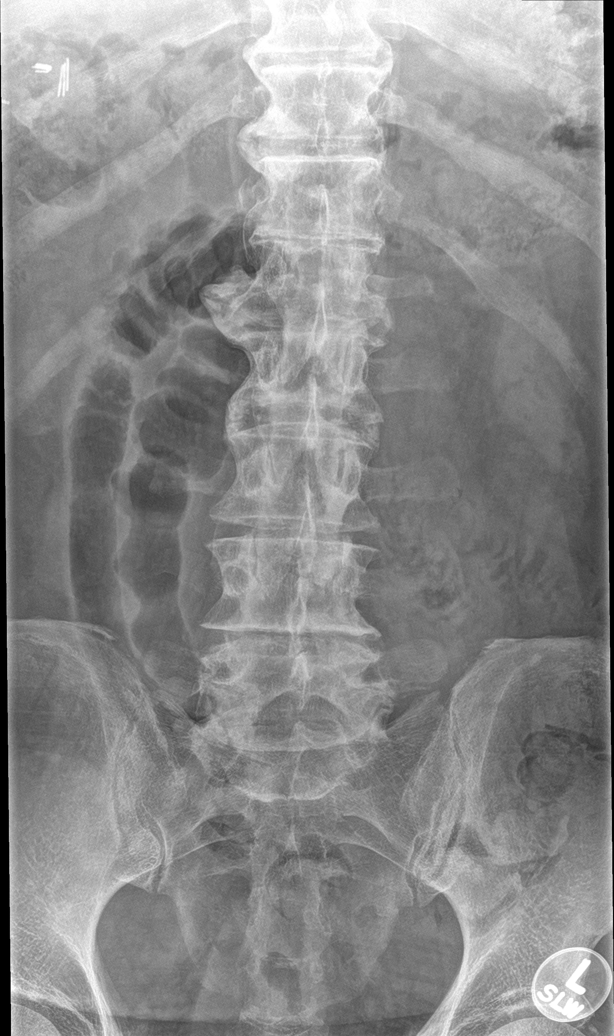

[l-spine obl (1 of 2)]
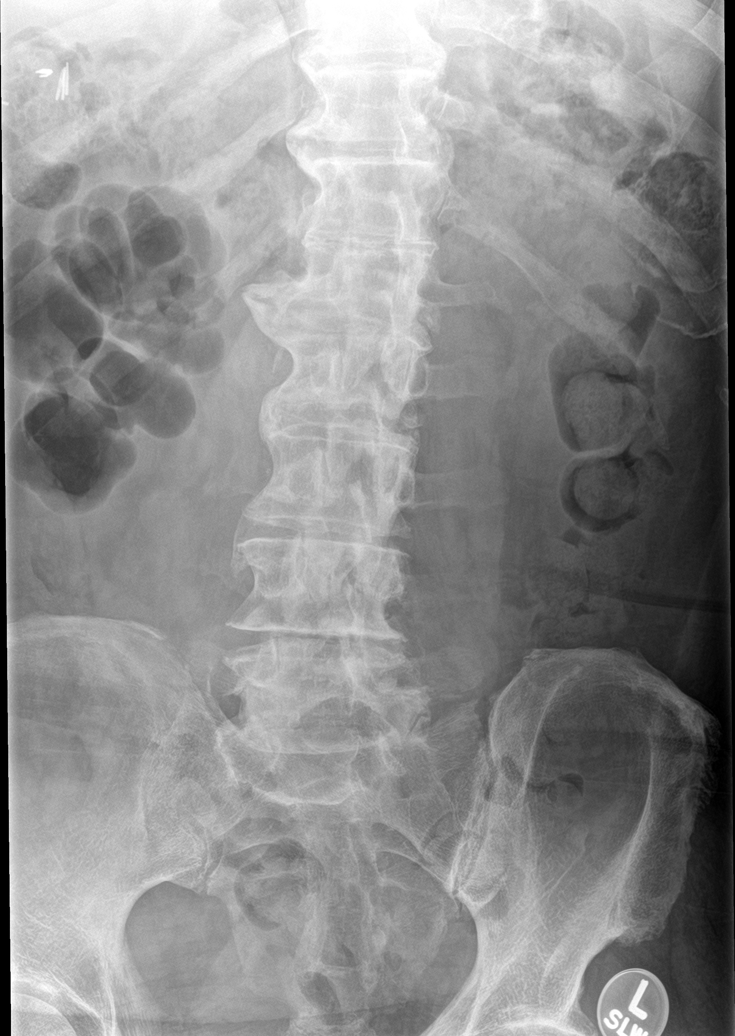

[l-spine obl (2 of 2)]
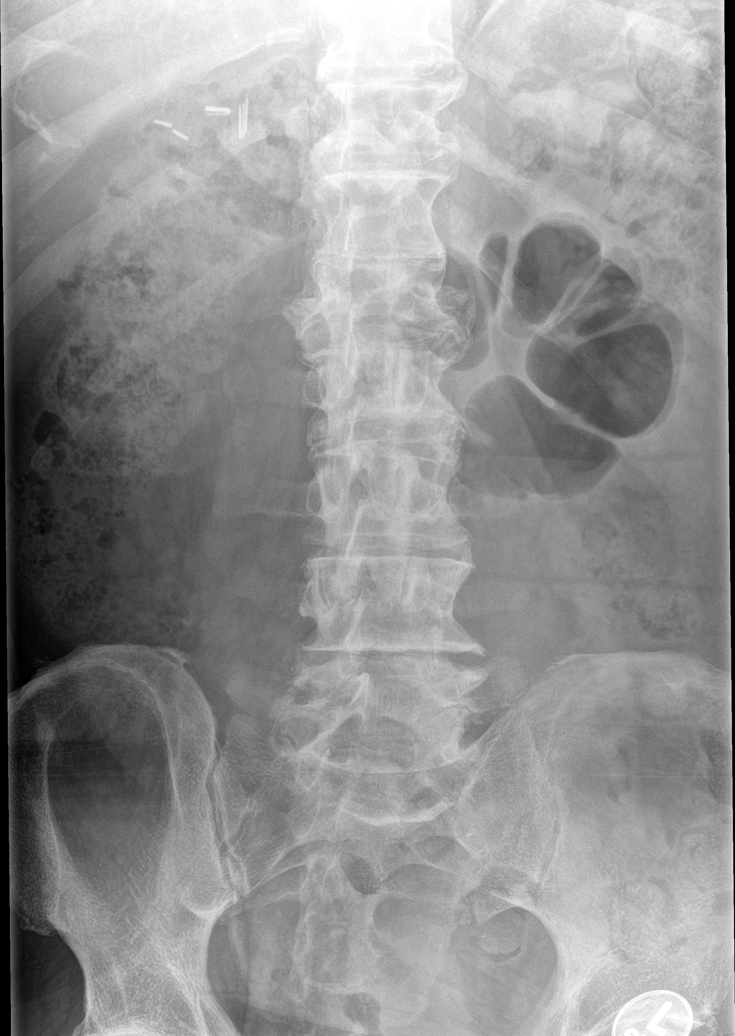

[l-spine lat]
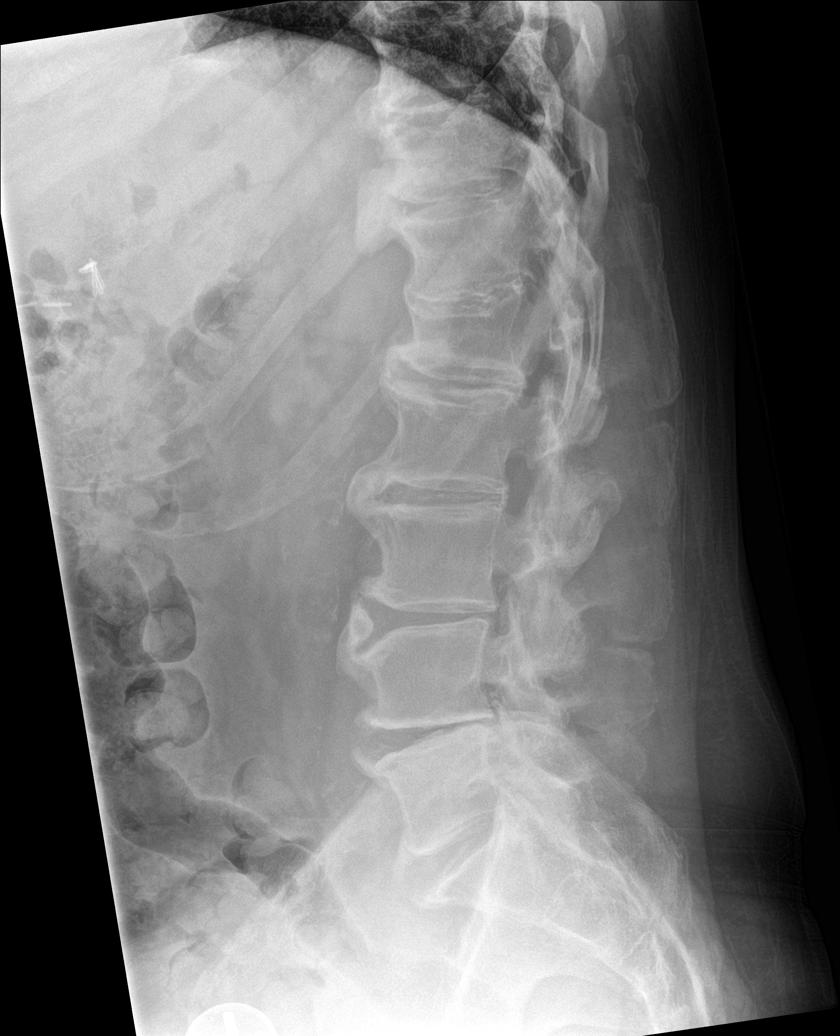

[l-spine spot]
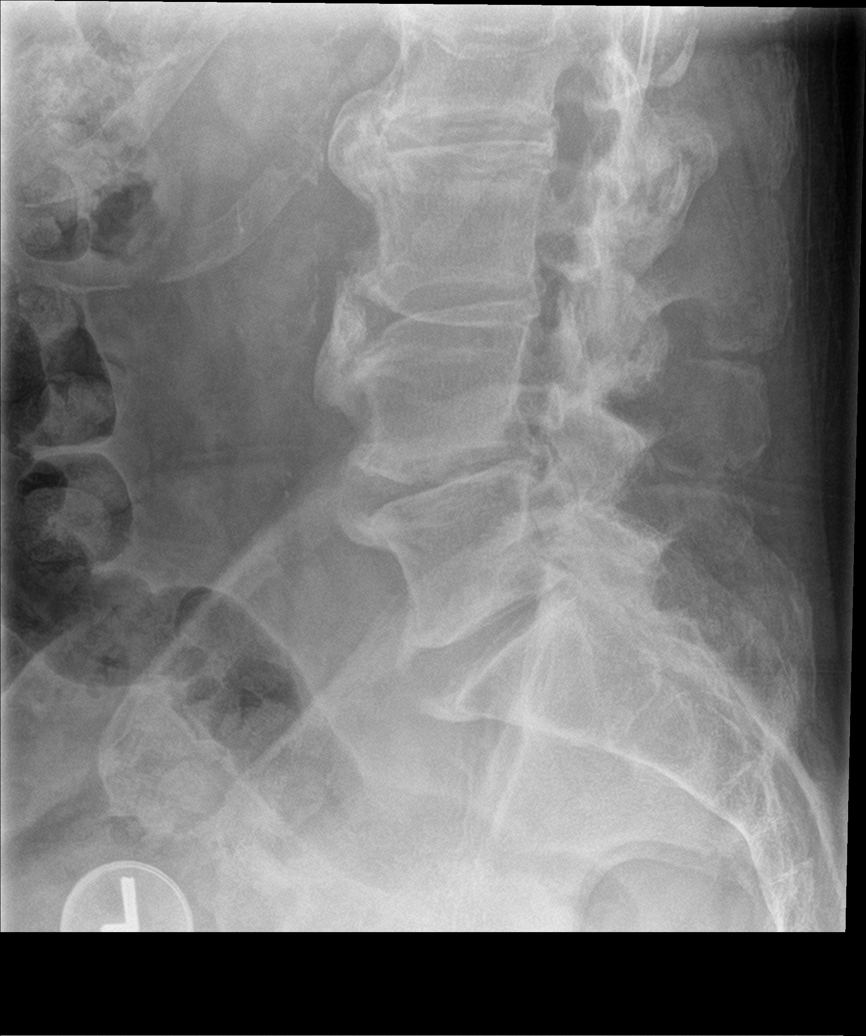

[5 of 5 positions shown; findings below may reference images not displayed]

FINDINGS: Chronic compression fracture L1.  No acute fracture or mass.

Diffuse disc degeneration with disc space narrowing and prominent
anterior osteophytes throughout the lumbar spine. Posterior
osteophyte formation also present at L3-4, L4-5, L5-S1. This could
cause spinal stenosis. No pars defect. SI joints normal.
IMPRESSION: Chronic fracture L1.

Diffuse lumbar spondylosis.

## 2016-03-20 ENCOUNTER — Other Ambulatory Visit: Payer: Self-pay | Admitting: Family Medicine

## 2016-04-04 ENCOUNTER — Encounter: Payer: Self-pay | Admitting: Family Medicine

## 2016-04-04 ENCOUNTER — Ambulatory Visit (INDEPENDENT_AMBULATORY_CARE_PROVIDER_SITE_OTHER): Payer: Commercial Managed Care - HMO | Admitting: Family Medicine

## 2016-04-04 VITALS — BP 109/72 | HR 72 | Wt 223.0 lb

## 2016-04-04 DIAGNOSIS — S83412A Sprain of medial collateral ligament of left knee, initial encounter: Secondary | ICD-10-CM

## 2016-04-04 NOTE — Progress Notes (Signed)
CC: Kevin Wood is a 70 y.o. male is here for Knee Pain   Subjective: HPI:  Continued left knee pain localized at the medial aspect of the knee. It's present for the first few steps of walking and just barely noticeable afterwards. It's moderate in severity at its worst. He denies any locking catching or giving way. There's been no swelling redness or warmth. It's been painful ever since he slipped in the shower and fell to the ground. He denies any falls since then. He denies any balance issues. He denies joint pain elsewhere and no benefit from over-the-counter nonsteroidal anti-inflammatories   Review Of Systems Outlined In HPI  Past Medical History  Diagnosis Date  . Hyperlipidemia 12/12/2012  . Lumbar vertebral fracture ([place]) [place]  . Type 2 diabetes mellitus ([place]) 12/17/2012  . Elevated blood pressure 12/12/2012  . History of nephrolithiasis 12/17/2012  . Unspecified vitamin D deficiency 12/17/2012    Outside Records   . H/O degenerative disc disease 12/17/2012    Past Surgical History  Procedure Laterality Date  . Gallbladder surgery  2012  . Colon cancer  father  . C spine fustion  1994   Family History  Problem Relation Age of Onset  . Cancer Father     colon cancer    Social History   Social History  . Marital Status: Married    Spouse Name: N/A  . Number of Children: N/A  . Years of Education: N/A   Occupational History  . Not on file.   Social History Main Topics  . Smoking status: Never Smoker   . Smokeless tobacco: Not on file  . Alcohol Use: 0.5 oz/week    1 drink(s) per week  . Drug Use: No  . Sexual Activity: Yes   Other Topics Concern  . Not on file   Social History Narrative     Objective: BP 109/72 mmHg  Pulse 72  Wt 223 lb (101.152 kg)  Vital signs reviewed. General: Alert and Oriented, No Acute Distress HEENT: Pupils equal, round, reactive to light. Conjunctivae clear.  External ears unremarkable.  Moist mucous  membranes. Lungs: Clear and comfortable work of breathing, speaking in full sentences without accessory muscle use. Cardiac: Regular rate and rhythm.  Neuro: CN II-XII grossly intact, gait normal. Extremities: No peripheral edema.  Strong peripheral pulses.  Left knee exam shows full-strength and range of motion. There is no swelling, redness, nor warmth overlying the knee.  No patellar crepitus. No patellar apprehension. No pain with palpation of the inferior patellar pole.  No pain or laxity with  varus stress. Valgus stress reproduces his pain but no laxity.  Anterior drawer is negative. McMurray's negative. No popliteal space tenderness or palpable mass. No medial or lateral joint line tenderness to palpation. Mental Status: No depression, anxiety, nor agitation. Logical though process. Skin: Warm and dry.  Assessment & Plan: Mona was seen today for knee pain.  Diagnoses and all orders for this visit:  Sprain of MCL joint of knee, left, initial encounter   He was fitted in a large hinge knee brace and encouraged to use this during all waking hours. Additionally he was given home rehabilitation plan to engage in for the next month.Signs and symptoms requring emergent/urgent reevaluation were discussed with the patient.   Return if symptoms worsen or fail to improve.

## 2016-04-18 ENCOUNTER — Other Ambulatory Visit: Payer: Self-pay | Admitting: Family Medicine

## 2016-05-01 ENCOUNTER — Ambulatory Visit (INDEPENDENT_AMBULATORY_CARE_PROVIDER_SITE_OTHER): Payer: Commercial Managed Care - HMO

## 2016-05-01 ENCOUNTER — Encounter: Payer: Self-pay | Admitting: Family Medicine

## 2016-05-01 ENCOUNTER — Ambulatory Visit (INDEPENDENT_AMBULATORY_CARE_PROVIDER_SITE_OTHER): Payer: Commercial Managed Care - HMO | Admitting: Family Medicine

## 2016-05-01 VITALS — BP 135/77 | HR 79 | Wt 225.0 lb

## 2016-05-01 DIAGNOSIS — M25561 Pain in right knee: Secondary | ICD-10-CM | POA: Diagnosis not present

## 2016-05-01 DIAGNOSIS — S8992XA Unspecified injury of left lower leg, initial encounter: Secondary | ICD-10-CM | POA: Diagnosis not present

## 2016-05-01 DIAGNOSIS — M25562 Pain in left knee: Secondary | ICD-10-CM | POA: Diagnosis not present

## 2016-05-01 DIAGNOSIS — M7651 Patellar tendinitis, right knee: Secondary | ICD-10-CM | POA: Diagnosis not present

## 2016-05-01 DIAGNOSIS — S8991XA Unspecified injury of right lower leg, initial encounter: Secondary | ICD-10-CM | POA: Diagnosis not present

## 2016-05-01 NOTE — Progress Notes (Signed)
Kevin Wood is a 70 y.o. male who presents to [facility] today for  Left knee pain. Patient slipped and fell in the shower 3 months ago injuring his left knee. He denies any swelling at the time of the injury however notes continued medial knee pain and soreness. He has tried using over-the-counter NSAIDs as well as a hinged knee brace which has not helped. Symptoms are worse with activity and better with rest. No radiating pain weakness or numbness fevers or chills.   Past Medical History  Diagnosis Date  . Hyperlipidemia 12/12/2012  . Lumbar vertebral fracture ([place]) [place]  . Type 2 diabetes mellitus ([place]) 12/17/2012  . Elevated blood pressure 12/12/2012  . History of nephrolithiasis 12/17/2012  . Unspecified vitamin D deficiency 12/17/2012    Outside Records   . H/O degenerative disc disease 12/17/2012   Past Surgical History  Procedure Laterality Date  . Gallbladder surgery  2012  . Colon cancer  father  . C spine fustion  1994   Social History  Substance Use Topics  . Smoking status: Never Smoker   . Smokeless tobacco: Not on file  . Alcohol Use: 0.5 oz/week    1 drink(s) per week   family history includes Cancer in his father.  ROS:  No headache, visual changes, nausea, vomiting, diarrhea, constipation, dizziness, abdominal pain, skin rash, fevers, chills, night sweats, weight loss, swollen lymph nodes, body aches, joint swelling, muscle aches, chest pain, shortness of breath, mood changes, visual or auditory hallucinations.    Medications: Current Outpatient Prescriptions  Medication Sig Dispense Refill  . AMBULATORY NON FORMULARY MEDICATION One Touch Ultra Test Strips - diagnosis E11.8, DM2  Check fasting blood sugar once daily and random blood sugar daily, checking a total of blood sugars twice daily 100 strip 11  . lisinopril-hydrochlorothiazide (PRINZIDE,ZESTORETIC) 20-12.5 MG tablet Take 1 tablet by mouth  daily. 90 tablet 3  . simvastatin (ZOCOR) 40 MG tablet Take 1 tablet (40 mg total) by mouth daily. NEED FOLLOW UP APPOINTMENT FOR MORE REFILLS 15 tablet 0   No current facility-administered medications for this visit.   No Known Allergies   Exam:  BP 135/77 mmHg  Pulse 79  Wt 225 lb (102.059 kg) General: Well Developed, well nourished, and in no acute distress.  Neuro/Psych: Alert and oriented x3, extra-ocular muscles intact, able to move all 4 extremities, sensation grossly intact. Skin: Warm and dry, no rashes noted.  Respiratory: Not using accessory muscles, speaking in full sentences, trachea midline.  Cardiovascular: Pulses palpable, no extremity edema. Abdomen: Does not appear distended. MSK: Left knee is normal-appearing with no effusion or erythema Range of motion 0-120 with 1+ retropatellar crepitations Tender to palpation medial aspect of the knee at the joint line and onto the past answering area without swelling Significant pain but no laxity but with guarding with stress of the MCL. No pain with stress of LCL Positive medial McMurray's test negative bilaterally pain and guarding with anterior drawer testing Patient is pain-free and has intact strength to resisted extension and flexion Normal gait Capillary refill sensation and pulses are intact distally    No results found for this or any previous visit (from the past 24 hour(s)). Dg Knee 1-2 Views Right  05/01/2016  CLINICAL DATA:  Chronic left knee pain after fall several months ago. Initial encounter. EXAM: RIGHT KNEE - 1-2 VIEW COMPARISON:  None. FINDINGS: No evidence of fracture, dislocation, or joint effusion. No evidence of  arthropathy. Mild spurring of patella is noted. Soft tissues are unremarkable. IMPRESSION: Mild patellar spurring. No acute abnormality seen in the right knee. Electronically Signed   By: Marijo Conception, M.D.   On: 05/01/2016 11:24   Dg Knee Complete 4 Views Left  05/01/2016  CLINICAL DATA:   Patient status post fall in the shower. Medial left knee pain. Initial encounter. EXAM: LEFT KNEE - COMPLETE 4+ VIEW COMPARISON:  None. FINDINGS: Normal anatomic alignment. No evidence for acute fracture or dislocation. Patellofemoral compartment degenerative changes. No joint effusion. IMPRESSION: No acute osseous abnormality.  Mild degenerative changes. Electronically Signed   By: Lovey Newcomer M.D.   On: 05/01/2016 11:24     Assessment and Plan: 70 y.o. male with left knee pain following injury. Concern for ligamentous injury versus meniscus injury. Discussed options. X-ray unremarkable. Plan for MRI for further planning. Return following MRI.  CC: Marcial Pacas, DO

## 2016-05-01 NOTE — Progress Notes (Signed)
Quick Note:  Knee shows mild arthritis. ______

## 2016-05-01 NOTE — Patient Instructions (Signed)
Thank you for coming in today. Get MRI.  Follow up after MRI with me to discuss results.   Meniscus Tear A meniscus tear is a knee injury in which a piece of the meniscus is torn. The meniscus is a thick, rubbery, wedge-shaped cartilage in the knee. Two menisci are located in each knee. They sit between the upper bone (femur) and lower bone (tibia) that make up the knee joint. Each meniscus acts as a shock absorber for the knee. A torn meniscus is one of the most common types of knee injuries. This injury can range from mild to severe. Surgery may be needed for a severe tear. CAUSES This injury may be caused by any squatting, twisting, or pivoting movement. Sports-related injuries are the most common cause. These often occur from:  Running and stopping suddenly.  Changing direction.  Being tackled or knocked off your feet. As people get older, their meniscus gets thinner and weaker. In these people, tears can happen more easily, such as from climbing stairs.  RISK FACTORS This injury is more likely to happen to:  People who play contact sports.  Males.  People who are 74-26 years of age. SYMPTOMS  Symptoms of this injury include:  Knee pain, especially at the side of the knee joint. You may feel pain when the injury occurs, or you may only hear a pop and feel pain later.  A feeling that your knee is clicking, catching, locking, or giving way.  Not being able to fully bend or extend your knee.  Bruising or swelling in your knee. DIAGNOSIS  This injury may be diagnosed based on your symptoms and a physical exam. The physical exam may include:  Moving your knee in different ways.  Feeling for tenderness.  Listening for a clicking sound.  Checking if your knee locks or catches. You may also have tests, such as:  X-rays.  MRI.  A procedure to look inside your knee with a narrow surgical telescope (arthroscopy). You may be referred to a knee specialist (orthopedic  surgeon). TREATMENT  Treatment for this injury depends on the severity of the tear. Treatment for a mild tear may include:  Rest.  Medicine to reduce pain and swelling. This is usually a nonsteroidal anti-inflammatory drug (NSAID).  A knee brace or an elastic sleeve or wrap.  Using crutches or a walker to keep weight off your knee and to help you walk.  Exercises to strengthen your knee (physical therapy). You may need surgery if you have a severe tear or if other treatments are not working.  HOME CARE INSTRUCTIONS Managing Pain and Swelling  Take over-the-counter and prescription medicines only as told by your health care provider.  If directed, apply ice to the injured area:  Put ice in a plastic bag.  Place a towel between your skin and the bag.  Leave the ice on for 20 minutes, 2-3 times per day.  Raise (elevate) the injured area above the level of your heart while you are sitting or lying down. Activity  Do not use the injured limb to support your body weight until your health care provider says that you can. Use crutches or a walker as told by your health care provider.  Return to your normal activities as told by your health care provider. Ask your health care provider what activities are safe for you.  Perform range-of-motion exercises only as told by your health care provider.  Begin doing exercises to strengthen your knee and leg muscles  only as told by your health care provider. After you recover, your health care provider may recommend these exercises to help prevent another injury. General Instructions  Use a knee brace or elastic wrap as told by your health care provider.  Keep all follow-up visits as told by your health care provider. This is important. SEEK MEDICAL CARE IF:  You have a fever.  Your knee becomes red, tender, or swollen.  Your pain medicine is not helping.  Your symptoms get worse or do not improve after 2 weeks of home care.   This  information is not intended to replace advice given to you by your health care provider. Make sure you discuss any questions you have with your health care provider.   Document Released: 01/12/2003 Document Revised: 07/13/2015 Document Reviewed: 02/14/2015 Elsevier Interactive Patient Education [employer].

## 2016-05-03 ENCOUNTER — Other Ambulatory Visit: Payer: Self-pay

## 2016-05-03 ENCOUNTER — Telehealth: Payer: Self-pay | Admitting: Family Medicine

## 2016-05-03 MED ORDER — DICLOFENAC SODIUM 1 % TD GEL: 4.0000 g | Freq: Four times a day (QID) | TRANSDERMAL | Status: DC

## 2016-05-03 NOTE — Telephone Encounter (Signed)
Pt.notified

## 2016-05-03 NOTE — Telephone Encounter (Signed)
-----   Message from Georgiann Mccoy, [place] sent at 05/02/2016  1:12 PM EDT ----- Pt notified of results. States that you recommended a OTC cream for his knee pain and would like to know the product name. Please advise.

## 2016-05-03 NOTE — Telephone Encounter (Signed)
I prescribed voltaren gel for the knee pain.

## 2016-05-04 ENCOUNTER — Other Ambulatory Visit: Payer: Self-pay | Admitting: Family Medicine

## 2016-05-07 ENCOUNTER — Ambulatory Visit (INDEPENDENT_AMBULATORY_CARE_PROVIDER_SITE_OTHER): Payer: Commercial Managed Care - HMO

## 2016-05-07 DIAGNOSIS — W182XXA Fall in (into) shower or empty bathtub, initial encounter: Secondary | ICD-10-CM | POA: Diagnosis not present

## 2016-05-07 DIAGNOSIS — S83242A Other tear of medial meniscus, current injury, left knee, initial encounter: Secondary | ICD-10-CM | POA: Diagnosis not present

## 2016-05-07 DIAGNOSIS — M25562 Pain in left knee: Secondary | ICD-10-CM

## 2016-05-07 NOTE — Progress Notes (Signed)
Quick Note:  Mild meniscus tear with no large tears. Return to clinic to go over the images and plan in detail. ______

## 2016-05-15 ENCOUNTER — Encounter: Payer: Self-pay | Admitting: Family Medicine

## 2016-05-15 ENCOUNTER — Ambulatory Visit (INDEPENDENT_AMBULATORY_CARE_PROVIDER_SITE_OTHER): Payer: Commercial Managed Care - HMO | Admitting: Family Medicine

## 2016-05-15 VITALS — BP 122/76 | HR 80 | Wt 225.0 lb

## 2016-05-15 DIAGNOSIS — S83242A Other tear of medial meniscus, current injury, left knee, initial encounter: Secondary | ICD-10-CM | POA: Insufficient documentation

## 2016-05-15 DIAGNOSIS — M1712 Unilateral primary osteoarthritis, left knee: Secondary | ICD-10-CM | POA: Insufficient documentation

## 2016-05-15 DIAGNOSIS — M25562 Pain in left knee: Secondary | ICD-10-CM | POA: Diagnosis not present

## 2016-05-15 NOTE — Patient Instructions (Signed)
Thank you for coming in today. Return as needed. Take your brace with your [place] but you do not need to wear it if it feels ok.    Meniscus Tear With Phase I Rehab The meniscus is a C-shaped cartilage structure, located in the knee joint between the thigh bone (femur) and the shinbone (tibia). Two menisci are located in each knee joint: the inner and outer meniscus. The meniscus acts as an adapter between the thigh bone and shinbone, allowing them to fit properly together. It also functions as a shock absorber, to reduce the stress placed on the knee joint and to help supply nutrients to the knee joint cartilage. As people age, the meniscus begins to harden and become more vulnerable to injury. Meniscus tears are a common injury, especially in older athletes. Inner meniscus tears are more common than outer meniscus tears.  SYMPTOMS   Pain in the knee, especially with standing or squatting with the affected leg.  Tenderness along the joint line.  Swelling in the knee joint (effusion), usually starting 1 to 2 days after injury.  Locking or catching of the knee joint, causing inability to straighten the knee completely.  Giving way or buckling of the knee. CAUSES  A meniscus tear occurs when a force is placed on the meniscus that is greater than it can handle. Common causes of injury include:  Direct hit (trauma) to the knee.  Twisting, pivoting, or cutting (rapidly changing direction while running), kneeling or squatting.  Without injury, due to aging. RISK INCREASES WITH:  Contact sports (football, rugby).  Sports in which cleats are used with pivoting (soccer, lacrosse) or sports in which good shoe grip and sudden change in direction are required (racquetball, basketball, squash).  Previous knee injury.  Associated knee injury, particularly ligament injuries.  Poor strength and flexibility. PREVENTION  Warm up and stretch properly before activity.  Maintain physical  fitness:  Strength, flexibility, and endurance.  Cardiovascular fitness.  Protect the knee with a brace or elastic bandage.  Wear properly fitted protective equipment (proper cleats for the surface). PROGNOSIS  Sometimes, meniscus tears heal on their own. However, definitive treatment requires surgery, followed by at least 6 weeks of recovery.  RELATED COMPLICATIONS   Recurring symptoms that result in a chronic problem.  Repeated knee injury, especially if sports are resumed too soon after injury or surgery.  Progression of the tear (the tear gets larger), if untreated.  Arthritis of the knee in later years (with or without surgery).  Complications of surgery, including infection, bleeding, injury to nerves (numbness, weakness, paralysis) continued pain, giving way, locking, nonhealing of meniscus (if repaired), need for further surgery, and knee stiffness (loss of motion). TREATMENT  Treatment first involves the use of ice and medicine, to reduce pain and inflammation. You may find using crutches to walk more comfortable. However, it is okay to bear weight on the injured knee, if the pain will allow it. Surgery is often advised as a definitive treatment. Surgery is performed through an incision near the joint (arthroscopically). The torn piece of the meniscus is removed, and if possible the joint cartilage is repaired. After surgery, the joint must be restrained. After restraint, it is important to perform strengthening and stretching exercises to help regain strength and a full range of motion. These exercises may be completed at home or with a therapist.  MEDICATION  If pain medicine is needed, nonsteroidal anti-inflammatory medicines (aspirin and ibuprofen), or other minor pain relievers (acetaminophen), are often advised.  Do not take pain medicine for 7 days before surgery.  Prescription pain relievers may be given, if your caregiver thinks they are needed. Use only as directed  and only as much as you need. HEAT AND COLD  Cold treatment (icing) should be applied for 10 to 15 minutes every 2 to 3 hours for inflammation and pain, and immediately after activity that aggravates your symptoms. Use ice packs or an ice massage.  Heat treatment may be used before performing stretching and strengthening activities prescribed by your caregiver, physical therapist, or athletic trainer. Use a heat pack or a warm water soak. SEEK MEDICAL CARE IF:   Symptoms get worse or do not improve in 2 weeks, despite treatment.  New, unexplained symptoms develop. (Drugs used in treatment may produce side effects.) EXERCISES RANGE OF MOTION (ROM) AND STRETCHING EXERCISES - Meniscus Tear, Non-operative, Phase I These are some of the initial exercises with which you may start your rehabilitation program, until you see your caregiver again or until your symptoms are resolved. Remember:   These initial exercises are intended to be gentle. They will help you restore motion without increasing any swelling.  Completing these exercises allows less painful movement and prepares you for the more aggressive strengthening exercises in Phase II.  An effective stretch should be held for at least 30 seconds.  A stretch should never be painful. You should only feel a gentle lengthening or release in the stretched tissue. RANGE OF MOTION - Knee Flexion, Active  Lie on your back with both knees straight. (If this causes back discomfort, bend your healthy knee, placing your foot flat on the floor.)  Slowly slide your heel back toward your buttocks until you feel a gentle stretch in the front of your knee or thigh.  Hold for __________ seconds. Slowly slide your heel back to the starting position. Repeat __________ times. Complete this exercise __________ times per day.  RANGE OF MOTION - Knee Flexion and Extension, Active-Assisted  Sit on the edge of a table or chair with your thighs firmly supported.  It may be helpful to place a folded towel under the end of your right / left thigh.  Flexion (bending): Place the ankle of your healthy leg on top of the other ankle. Use your healthy leg to gently bend your right / left knee until you feel a mild tension across the top of your knee.  Hold for __________ seconds.  Extension (straightening): Switch your ankles so your right / left leg is on top. Use your healthy leg to straighten your right / left knee until you feel a mild tension on the backside of your knee.  Hold for __________ seconds. Repeat __________ times. Complete __________ times per day. STRETCH - Knee Flexion, Supine  Lie on the floor with your right / left heel and foot lightly touching the wall. (Place both feet on the wall if you do not use a door frame.)  Without using any effort, allow gravity to slide your foot down the wall slowly until you feel a gentle stretch in the front of your right / left knee.  Hold this stretch for __________ seconds. Then return the leg to the starting position, using your healthy leg for help, if needed. Repeat __________ times. Complete this stretch __________ times per day.  STRETCH - Knee Extension Sitting  Sit with your right / left leg/heel propped on another chair, coffee table, or foot stool.  Allow your leg muscles to relax, letting gravity  straighten out your knee.*  You should feel a stretch behind your right / left knee. Hold this position for __________ seconds. Repeat __________ times. Complete this stretch __________ times per day.  *Your physician, physical therapist or athletic trainer may instruct you place a __________ weight on your thigh, just above your kneecap, to deepen the stretch.  STRENGTHENING EXERCISES - Meniscus Tear, Non-operative, Phase I These exercises may help you when beginning to rehabilitate your injury. They may resolve your symptoms with or without further involvement from your physician, physical  therapist or athletic trainer. While completing these exercises, remember:   Muscles can gain both the endurance and the strength needed for everyday activities through controlled exercises.  Complete these exercises as instructed by your physician, physical therapist or athletic trainer. Progress the resistance and repetitions only as guided. STRENGTH - Quadriceps, Isometrics  Lie on your back with your right / left leg extended and your opposite knee bent.  Gradually tense the muscles in the front of your right / left thigh. You should see either your knee cap slide up toward your hip or increased dimpling just above the knee. This motion will push the back of the knee down toward the floor, mat, or bed on which you are lying.  Hold the muscle as tight as you can, without increasing your pain, for __________ seconds.  Relax the muscles slowly and completely between each repetition. Repeat __________ times. Complete this exercise __________ times per day.  STRENGTH - Quadriceps, Short Arcs   Lie on your back. Place a __________ inch towel roll under your right / left knee, so that the knee bends slightly.  Raise only your lower leg by tightening the muscles in the front of your thigh. Do not allow your thigh to rise.  Hold this position for __________ seconds. Repeat __________ times. Complete this exercise __________ times per day.  OPTIONAL ANKLE WEIGHTS: Begin with ____________________, but DO NOT exceed ____________________. Increase in 1 pound/0.5 kilogram increments. STRENGTH - Quadriceps, Straight Leg Raises  Quality counts! Watch for signs that the quadriceps muscle is working, to be sure you are strengthening the correct muscles and not "cheating" by substituting with healthier muscles.  Lay on your back with your right / left leg extended and your opposite knee bent.  Tense the muscles in the front of your right / left thigh. You should see either your knee cap slide up or  increased dimpling just above the knee. Your thigh may even shake a bit.  Tighten these muscles even more and raise your leg 4 to 6 inches off the floor. Hold for __________ seconds.  Keeping these muscles tense, lower your leg.  Relax the muscles slowly and completely in between each repetition. Repeat __________ times. Complete this exercise __________ times per day.  STRENGTH - Hamstring, Curls   Lay on your stomach with your legs extended. (If you lay on a bed, your feet may hang over the edge.)  Tighten the muscles in the back of your thigh to bend your right / left knee up to 90 degrees. Keep your hips flat on the bed.  Hold this position for __________ seconds.  Slowly lower your leg back to the starting position. Repeat __________ times. Complete this exercise __________ times per day.  STRENGTH - Quadriceps, Squats  Stand in a door frame so that your feet and knees are in line with the frame.  Use your hands for balance, not support, on the frame.  Slowly lower  your weight, bending at the hips and knees. Keep your lower legs upright so that they are parallel with the door frame. Squat only within the range that does not increase your knee pain. Never let your hips drop below your knees.  Slowly return upright, pushing with your legs, not pulling with your hands. Repeat __________ times. Complete this exercise __________ times per day.  STRENGTH - Quad/VMO, Isometric   Sit in a chair with your right / left knee slightly bent. With your fingertips, feel the VMO muscle just above the inside of your knee. The VMO is important in controlling the position of your kneecap.  Keeping your fingertips on this muscle. Without actually moving your leg, attempt to drive your knee down as if straightening your leg. You should feel your VMO tense. If you have a difficult time, you may wish to try the same exercise on your healthy knee first.  Tense this muscle as hard as you can without  increasing any knee pain.  Hold for __________ seconds. Relax the muscles slowly and completely in between each repetition. Repeat __________ times. Complete exercise __________ times per day.    This information is not intended to replace advice given to you by your health care provider. Make sure you discuss any questions you have with your health care provider.   Document Released: 11/05/1998 Document Revised: 03/08/2015 Document Reviewed: 02/03/2009 Elsevier Interactive Patient Education [employer].

## 2016-05-15 NOTE — Progress Notes (Signed)
Kevin Wood is a 70 y.o. male who presents to [facility]: [facility] today for left knee pain.  Patient has had about 4 months of left knee pain after falling in the shower. He feels conservative management and had MRI last week. He is here today for follow-up. He notes his knee feels pretty good most the time with very minimal pain. He does occasionally experience popping. He denies any significant locking or catching currently. He notes most the time he is pain-free. He feels well.     Past Medical History  Diagnosis Date  . Hyperlipidemia 12/12/2012  . Lumbar vertebral fracture ([facility]) [place]  . Type 2 diabetes mellitus ([facility]) 12/17/2012  . Elevated blood pressure 12/12/2012  . History of nephrolithiasis 12/17/2012  . Unspecified vitamin D deficiency 12/17/2012    Outside Records   . H/O degenerative disc disease 12/17/2012   Past Surgical History  Procedure Laterality Date  . Gallbladder surgery  2012  . Colon cancer  father  . C spine fustion  1994   Social History  Substance Use Topics  . Smoking status: Never Smoker   . Smokeless tobacco: Not on file  . Alcohol Use: 0.5 oz/week    1 drink(s) per week   family history includes Cancer in his father.  ROS as above:  Medications: Current Outpatient Prescriptions  Medication Sig Dispense Refill  . AMBULATORY NON FORMULARY MEDICATION One Touch Ultra Test Strips - diagnosis E11.8, DM2  Check fasting blood sugar once daily and random blood sugar daily, checking a total of blood sugars twice daily 100 strip 11  . diclofenac sodium (VOLTAREN) 1 % GEL Apply 4 g topically 4 (four) times daily. To affected joint. 100 g 11  . lisinopril-hydrochlorothiazide (PRINZIDE,ZESTORETIC) 20-12.5 MG tablet Take 1 tablet by mouth daily. 90 tablet 3  . simvastatin (ZOCOR) 40 MG tablet TAKE 1 TABLET DAILY NEED FOLLOW UP  APPOINTMENT FOR MORE REFILLS 7 tablet 0   No current facility-administered medications for this visit.   No Known Allergies   Exam:  BP 122/76 mmHg  Pulse 80  Wt 225 lb (102.059 kg) Gen: Well NAD Left knee normal-appearing. Mildly tender to palpation medial joint line. Normal motion with crepitations present. Normal gait.  CLINICAL DATA: Status post fall in shower 3 months ago  EXAM: MRI OF THE LEFT KNEE WITHOUT CONTRAST  TECHNIQUE: Multiplanar, multisequence MR imaging of the knee was performed. No intravenous contrast was administered.  COMPARISON: None.  FINDINGS: MENISCI  Medial meniscus: Undersurface tear of the body of the medial meniscus.  Lateral meniscus: Intact.  LIGAMENTS  Cruciates: Intact ACL and PCL.  Collaterals: Mild edema superficial to the MCL with mild MCL thickening concerning for subacute injury. Lateral collateral ligament complex is intact.  CARTILAGE  Patellofemoral: Partial-thickness cartilage loss of the patellar apex and medial patellar facet.  Medial: Partial-thickness cartilage loss of the medial femoral condyle and medial tibial plateau.  Lateral: No chondral defect.  Joint: No joint effusion. Normal Hoffa's fat. No plical thickening.  Popliteal Fossa: No Baker cyst. Intact popliteus tendon.  Extensor Mechanism: Intact.  Bones: No focal marrow signal abnormality. No fracture or dislocation.  Other: None  IMPRESSION: 1. Undersurface tear of the body of the medial meniscus. 2. Mild edema superficial to the MCL with mild MCL thickening concerning for subacute injury. 3. Partial-thickness cartilage loss of the medial femorotibial compartment and medial patellofemoral compartment.   Electronically Signed  By: Kathreen Devoid  On: 05/07/2016 09:11  No results found for this or any previous visit (from the past 24 hour(s)). No results found.    Assessment and Plan: 70 y.o. male with  medial meniscus tear of the left knee. This is in the setting of moderate DJD. Discussed options. Plan for watchful waiting and home exercise program. If not improving would recommend steroid injection followed by a referral to surgery if not better after injection.. Return as needed.  Discussed warning signs or symptoms. Please see discharge instructions. Patient expresses [email or web address] rstanding.

## 2016-05-16 ENCOUNTER — Other Ambulatory Visit: Payer: Self-pay | Admitting: Family Medicine

## 2016-05-23 ENCOUNTER — Other Ambulatory Visit: Payer: Self-pay | Admitting: Family Medicine

## 2016-06-05 ENCOUNTER — Encounter: Payer: Self-pay | Admitting: Family Medicine

## 2016-06-05 ENCOUNTER — Ambulatory Visit (INDEPENDENT_AMBULATORY_CARE_PROVIDER_SITE_OTHER): Payer: Commercial Managed Care - HMO | Admitting: Family Medicine

## 2016-06-05 VITALS — BP 115/76 | HR 98 | Wt 223.0 lb

## 2016-06-05 DIAGNOSIS — B9689 Other specified bacterial agents as the cause of diseases classified elsewhere: Secondary | ICD-10-CM

## 2016-06-05 DIAGNOSIS — A499 Bacterial infection, unspecified: Secondary | ICD-10-CM | POA: Diagnosis not present

## 2016-06-05 DIAGNOSIS — J329 Chronic sinusitis, unspecified: Secondary | ICD-10-CM | POA: Diagnosis not present

## 2016-06-05 MED ORDER — AMOXICILLIN-POT CLAVULANATE 500-125 MG PO TABS: ORAL_TABLET | ORAL | 0 refills | Status: AC

## 2016-06-05 NOTE — Progress Notes (Signed)
CC: Kevin Wood is a 70 y.o. male is here for Cough   Subjective: HPI:  1 week of facial pressure behind the eyes and between the eyes with postnasal drip and nonproductive cough. Symptoms are worse when lying down flat. No benefit from Aleve. No other interventions as of yet. Denies fever, chills, blood in sputum, shortness of breath or chest discomfort.   Review Of Systems Outlined In HPI  Past Medical History:  Diagnosis Date  . Elevated blood pressure 12/12/2012  . H/O degenerative disc disease 12/17/2012  . History of nephrolithiasis 12/17/2012  . Hyperlipidemia 12/12/2012  . Lumbar vertebral fracture ([facility]) [place]  . Type 2 diabetes mellitus ([facility]) 12/17/2012  . Unspecified vitamin D deficiency 12/17/2012   Outside Records     Past Surgical History:  Procedure Laterality Date  . c spine fustion  1994  . colon cancer  father  . GALLBLADDER SURGERY  2012   Family History  Problem Relation Age of Onset  . Cancer Father     colon cancer    Social History   Social History  . Marital status: Married    Spouse name: N/A  . Number of children: N/A  . Years of education: N/A   Occupational History  . Not on file.   Social History Main Topics  . Smoking status: Never Smoker  . Smokeless tobacco: Not on file  . Alcohol use 0.5 oz/week    1 drink(s) per week  . Drug use: No  . Sexual activity: Yes   Other Topics Concern  . Not on file   Social History Narrative  . No narrative on file     Objective: BP 115/76   Pulse 98   Wt 223 lb (101.2 kg)   SpO2 97%   BMI 30.24 kg/m   General: Alert and Oriented, No Acute Distress HEENT: Pupils equal, round, reactive to light. Conjunctivae clear.  External ears unremarkable, canals clear with intact TMs with appropriate landmarks.  Middle ear appears open without effusion. Pink inferior turbinates.  Moist mucous membranes, pharynx without inflammation nor lesionsOther than moderate postnasal drip.  Neck supple  without palpable lymphadenopathy nor abnormal masses. Lungs: Clear to auscultation bilaterally, no wheezing/ronchi/rales.  Comfortable work of breathing. Good air movement. Cardiac: Regular rate and rhythm. Normal S1/S2.  No murmurs, rubs, nor gallops.   Extremities: No peripheral edema.  Strong peripheral pulses.  Mental Status: No depression, anxiety, nor agitation. Skin: Warm and dry.  Assessment & Plan: Kevin Wood was seen today for cough.  Diagnoses and all orders for this visit:  Bacterial sinusitis  Other orders -     amoxicillin-clavulanate (AUGMENTIN) 500-125 MG tablet; Take one by mouth every 8 hours for ten total days.   Start Augmentin consider Mucinex and nasal saline washes. Call by Friday if no better, next step would be prednisone before he travels to [place] this weekend.  Return if symptoms worsen or fail to improve.

## 2016-06-07 ENCOUNTER — Telehealth: Payer: Self-pay

## 2016-06-07 MED ORDER — HYDROCODONE-HOMATROPINE 5-1.5 MG/5ML PO SYRP: 5.0000 mL | ORAL_SOLUTION | Freq: Three times a day (TID) | ORAL | 0 refills | Status: DC | PRN

## 2016-06-07 NOTE — Telephone Encounter (Signed)
Hycodan has been printed off however I think he has to pick this up in person.

## 2016-06-07 NOTE — Telephone Encounter (Signed)
Pt.notified

## 2016-07-05 ENCOUNTER — Telehealth: Payer: Self-pay | Admitting: Family Medicine

## 2016-07-05 MED ORDER — DICLOFENAC SODIUM 1 % TD GEL: 4.0000 g | Freq: Four times a day (QID) | TRANSDERMAL | 11 refills | Status: DC

## 2016-07-05 NOTE — Telephone Encounter (Signed)
We'll fax

## 2016-07-05 NOTE — Telephone Encounter (Signed)
Pt stated he would like a prescription of the Volteren Gel to be Faxed over to the [place] because that is where he would like that medication to be filled. (only the gel)The fax will be attn heather and the number is [phone]. Thanks

## 2016-07-12 ENCOUNTER — Ambulatory Visit (INDEPENDENT_AMBULATORY_CARE_PROVIDER_SITE_OTHER): Payer: Commercial Managed Care - HMO | Admitting: Family Medicine

## 2016-07-12 VITALS — BP 131/73 | HR 60 | Wt 226.0 lb

## 2016-07-12 DIAGNOSIS — I10 Essential (primary) hypertension: Secondary | ICD-10-CM

## 2016-07-12 DIAGNOSIS — Z23 Encounter for immunization: Secondary | ICD-10-CM | POA: Diagnosis not present

## 2016-07-12 DIAGNOSIS — E119 Type 2 diabetes mellitus without complications: Secondary | ICD-10-CM | POA: Diagnosis not present

## 2016-07-12 DIAGNOSIS — E785 Hyperlipidemia, unspecified: Secondary | ICD-10-CM

## 2016-07-12 DIAGNOSIS — Z1159 Encounter for screening for other viral diseases: Secondary | ICD-10-CM

## 2016-07-12 DIAGNOSIS — Z0000 Encounter for general adult medical examination without abnormal findings: Secondary | ICD-10-CM | POA: Diagnosis not present

## 2016-07-12 MED ORDER — AMBULATORY NON FORMULARY MEDICATION: 11 refills | Status: DC

## 2016-07-12 NOTE — Progress Notes (Signed)
Subjective:    Kevin Wood is a 70 y.o. male who presents for Medicare Annual/Subsequent preventive examination.   Preventive Screening-Counseling & Management  Tobacco History  Smoking Status  . Never Smoker  Smokeless Tobacco  . Not on file    Problems Prior to Visit 1.   Current Problems (verified) Patient Active Problem List   Diagnosis Date Noted  . Acute medial meniscus tear of left knee 05/15/2016  . Left knee pain 05/01/2016  . Internal hemorrhoids 04/12/2015  . 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 Essential hypertension 08/11/2013  . Type 2 diabetes mellitus ([place]) 12/17/2012  . History of nephrolithiasis 12/17/2012  . Unspecified vitamin D deficiency 12/17/2012  . Excessive cerumen in ear canal 12/17/2012  . H/O degenerative disc disease 12/17/2012  . 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 Family history of colon cancer 12/17/2012  . Hyperlipidemia 12/12/2012    Medications Prior to Visit Current Outpatient Prescriptions on File Prior to Visit  Medication Sig Dispense Refill  . diclofenac sodium (VOLTAREN) 1 % GEL Apply 4 g topically 4 (four) times daily. To affected joint. 100 g 11  . lisinopril-hydrochlorothiazide (PRINZIDE,ZESTORETIC) 20-12.5 MG tablet Take 1 tablet by mouth daily. 90 tablet 3  . simvastatin (ZOCOR) 40 MG tablet TAKE 1 TABLET DAILY (NEED FOLLOW UP APPOINTMENT FOR MORE REFILLS) 7 tablet 0   No current facility-administered medications on file prior to visit.     Current Medications (verified) Current Outpatient Prescriptions  Medication Sig Dispense Refill  . AMBULATORY NON FORMULARY MEDICATION Lou, and her of choice with test strips - diagnosis E11.8, DM2  Check fasting blood sugar once daily and random blood sugar daily, checking a total of blood sugars twice daily 100 strip 11  . diclofenac sodium (VOLTAREN) 1 % GEL Apply 4 g topically 4 (four) times daily. To affected joint. 100 g 11  .  lisinopril-hydrochlorothiazide (PRINZIDE,ZESTORETIC) 20-12.5 MG tablet Take 1 tablet by mouth daily. 90 tablet 3  . simvastatin (ZOCOR) 40 MG tablet TAKE 1 TABLET DAILY (NEED FOLLOW UP APPOINTMENT FOR MORE REFILLS) 7 tablet 0   No current facility-administered medications for this visit.      Allergies (verified) Review of patient's allergies indicates no known allergies.   PAST HISTORY  Family History Family History  Problem Relation Age of Onset  . Cancer Father     colon cancer    Social History Social History  Substance Use Topics  . Smoking status: Never Smoker  . Smokeless tobacco: Not on file  . Alcohol use 0.5 oz/week    1 drink(s) per week    Are there smokers in your home (other than you)?  No  Risk Factors Current exercise habits: The patient does not participate in regular exercise at present.  Dietary issues discussed: walking   Cardiac risk factors: advanced age (older than 10 for men, 47 for women), diabetes mellitus, dyslipidemia, hypertension, male gender, obesity (BMI >= 30 kg/m2) and sedentary lifestyle.  Depression Screen (Note: if answer to either of the following is "Yes", a more complete depression screening is indicated)   Q1: Over the past two weeks, have you felt down, depressed or hopeless? No  Q2: Over the past two weeks, have you felt little interest or pleasure in doing things? No  Have you lost interest or pleasure in daily life? No  Do you often feel hopeless? No  Do you cry easily over simple problems? No  Activities of Daily Living In your present state of health,  do you have any difficulty performing the following activities?:  Driving? No Managing money?  No Feeding yourself? No Getting from bed to chair? No Climbing a flight of stairs? No Preparing food and eating?: No Bathing or showering? No Getting dressed: No Getting to the toilet? No Using the toilet:No Moving around from place to place: No In the past year have you  fallen or had a near fall?:No     Hearing Difficulties: Yes Do you often ask people to speak up or repeat themselves? No Do you experience ringing or noises in your ears? Yes Do you have difficulty understanding soft or whispered voices? No   Do you feel that you have a problem with memory? No  Do you often misplace items? No  Do you feel safe at home?  Yes  Cognitive Testing  Alert? Yes  Normal Appearance?Yes  Oriented to person? Yes  Place? Yes   Time? Yes  Recall of three objects?  Yes  Can perform simple calculations? Yes  Displays appropriate judgment?Yes  Can read the correct time from a watch face?Yes   Advanced Directives have been discussed with the patient? Yes   List the Names of Other Physician/Practitioners you currently use: 1.    Indicate any recent Medical Services you may have received from other than [facility] providers in the past year (date may be approximate).  Immunization History  Administered Date(s) Administered  . Influenza, High Dose Seasonal PF 07/12/2016  . Influenza,inj,Quad PF,36+ Mos 07/02/2013  . Influenza-Unspecified 10/11/2011, 08/20/2015  . PPD Test 12/01/2014  . Pneumococcal Conjugate-13 03/03/2015  . Tdap 11/05/2009  . Zoster 08/24/2013    Screening Tests Health Maintenance  Topic Date Due  . Hepatitis C Screening  1946-08-07  . FOOT EXAM  03/16/1958  . OPHTHALMOLOGY EXAM  03/16/1958  . HEMOGLOBIN A1C  09/02/2015  . PNA vac Low Risk Adult (2 of 2 - PPSV23) 03/02/2016  . INFLUENZA VACCINE  06/05/2016  . Fecal DNA (Cologuard)  01/01/2019  . TETANUS/TDAP  11/06/2019  . ZOSTAVAX  Completed    All answers were reviewed with the patient and necessary referrals were made:  Lynne Leader, MD   07/12/2016   History reviewed: allergies, current medications, past family history, past medical history, past social history, past surgical history and problem list  Review of Systems Pertinent items noted in HPI and remainder of comprehensive  ROS otherwise negative.    Objective:     Vision by Snellen chart: right eye: Vision Screening  Edited by: Delrae Alfred, CMA   Right eye Left eye Both eyes  With correction 20/25 20/25 20/20     Vision Screening on 03/03/2015  Edited by: Delrae Alfred, CMA   Right eye Left eye Both eyes  With correction 20/25 20/25 20/20       Blood pressure 131/73, pulse 60, weight 226 lb (102.5 kg). Body mass index is 30.65 kg/m.  BP 131/73   Pulse 60   Wt 226 lb (102.5 kg)   BMI 30.65 kg/m   General Appearance:    Alert, cooperative, no distress, appears stated age  Head:    Normocephalic, without obvious abnormality, atraumatic  Eyes:    PERRL, conjunctiva/corneas clear, EOM's    Ears:    Normal TM's and external ear canals, both ears  Nose:   Nares normal, septum midline, mucosa normal, no drainage    or sinus tenderness  Throat:   Lips, mucosa, and tongue normal; teeth and gums normal  Neck:  Supple, symmetrical, trachea midline, no adenopathy;       thyroid:  No enlargement/tenderness/nodules; no carotid   bruit or JVD  Back:     Symmetric, no curvature, ROM normal, no CVA tenderness  Lungs:     Clear to auscultation bilaterally, respirations unlabored  Chest wall:    No tenderness or deformity  Heart:    Regular rate and rhythm, S1 and S2 normal, no murmur, rub   or gallop  Abdomen:     Soft, non-tender, bowel sounds active all four quadrants,    no masses, no organomegaly        Extremities:   Extremities normal, atraumatic, no cyanosis or edema  Pulses:   2+ and symmetric all extremities  Skin:   Skin color, texture, turgor normal, no rashes or lesions  Lymph nodes:   Cervical, supraclavicular, and axillary nodes normal  Neurologic:   CNII-XII intact. Normal strength, sensation and reflexes      throughout  Normal diabetic foot exam     Assessment:     Well Medicare adult visit. Diabetes discussed     Plan:     During the course of the visit the patient was  educated and counseled about appropriate screening and preventive services including:    Pneumococcal vaccine   Influenza vaccine  Colorectal cancer screening  Diabetes screening  Glaucoma screening  Nutrition counseling    Fasting labs and glucometer ordered. Obtain records from the [facility] hospital  Diet review for nutrition referral? Yes ____  Not Indicated xx____   Patient Instructions (the written plan) was given to the patient.  Medicare Attestation I have personally reviewed: The patient's medical and social history Their use of alcohol, tobacco or illicit drugs Their current medications and supplements The patient's functional ability including ADLs,fall risks, home safety risks, cognitive, and hearing and visual impairment Diet and physical activities Evidence for depression or mood disorders  The patient's weight, height, BMI, and visual acuity have been recorded in the chart.  I have made referrals, counseling, and provided education to the patient based on review of the above and I have provided the patient with a written personalized care plan for preventive services.     Lynne Leader, MD   07/12/2016

## 2016-07-12 NOTE — Patient Instructions (Signed)
Thank you for coming in today. Get fasting labs soon.  Return in 6 months or sooner if needed.  Please try to send me the Eye exam record.

## 2016-07-20 DIAGNOSIS — Z1159 Encounter for screening for other viral diseases: Secondary | ICD-10-CM | POA: Diagnosis not present

## 2016-07-20 DIAGNOSIS — Z0000 Encounter for general adult medical examination without abnormal findings: Secondary | ICD-10-CM | POA: Diagnosis not present

## 2016-07-20 DIAGNOSIS — E785 Hyperlipidemia, unspecified: Secondary | ICD-10-CM | POA: Diagnosis not present

## 2016-07-20 DIAGNOSIS — E119 Type 2 diabetes mellitus without complications: Secondary | ICD-10-CM | POA: Diagnosis not present

## 2016-07-20 DIAGNOSIS — Z1321 Encounter for screening for nutritional disorder: Secondary | ICD-10-CM | POA: Diagnosis not present

## 2016-07-20 DIAGNOSIS — I10 Essential (primary) hypertension: Secondary | ICD-10-CM | POA: Diagnosis not present

## 2016-07-20 LAB — LIPID PANEL
CHOL/HDL RATIO: 3.2 ratio (ref <=5.0)
Cholesterol: 128 mg/dL (ref 125–200)
HDL: 40 mg/dL (ref >=40)
LDL CALC: 69 mg/dL (ref <130)
TRIGLYCERIDES: 93 mg/dL (ref <150)
VLDL: 19 mg/dL (ref <30)

## 2016-07-20 LAB — COMPREHENSIVE METABOLIC PANEL
ALBUMIN: 4.1 g/dL (ref 3.6–5.1)
ALT: 23 U/L (ref 9–46)
AST: 22 U/L (ref 10–35)
Alkaline Phosphatase: 52 U/L (ref 40–115)
BUN: 15 mg/dL (ref 7–25)
CALCIUM: 8.8 mg/dL (ref 8.6–10.3)
CHLORIDE: 103 mmol/L (ref 98–110)
CO2: 30 mmol/L (ref 20–31)
Creat: 1.09 mg/dL (ref 0.70–1.25)
Glucose, Bld: 126 mg/dL — ABNORMAL HIGH (ref 65–99)
POTASSIUM: 4.1 mmol/L (ref 3.5–5.3)
SODIUM: 140 mmol/L (ref 135–146)
Total Bilirubin: 0.6 mg/dL (ref 0.2–1.2)
Total Protein: 6.4 g/dL (ref 6.1–8.1)

## 2016-07-20 LAB — CBC
HEMATOCRIT: 43.3 % (ref 38.5–50.0)
HEMOGLOBIN: 15.1 g/dL (ref 13.2–17.1)
MCH: 29.6 pg (ref 27.0–33.0)
MCHC: 34.9 g/dL (ref 32.0–36.0)
MCV: 84.9 fL (ref 80.0–100.0)
MPV: 11.4 fL (ref 7.5–12.5)
Platelets: 163 10*3/uL (ref 140–400)
RBC: 5.1 MIL/uL (ref 4.20–5.80)
RDW: 13.1 % (ref 11.0–15.0)
WBC: 4 10*3/uL (ref 3.8–10.8)

## 2016-07-21 LAB — HEMOGLOBIN A1C
Hgb A1c MFr Bld: 6.4 % — ABNORMAL HIGH (ref <5.7)
Mean Plasma Glucose: 137 mg/dL

## 2016-07-21 LAB — HEPATITIS C ANTIBODY: HCV AB: NEGATIVE

## 2016-07-21 LAB — VITAMIN D 25 HYDROXY (VIT D DEFICIENCY, FRACTURES): Vit D, 25-Hydroxy: 54 ng/mL (ref 30–100)

## 2016-08-07 ENCOUNTER — Encounter: Payer: Self-pay | Admitting: Family Medicine

## 2016-08-15 ENCOUNTER — Other Ambulatory Visit: Payer: Self-pay | Admitting: *Deleted

## 2016-08-15 MED ORDER — SIMVASTATIN 40 MG PO TABS: ORAL_TABLET | ORAL | 0 refills | Status: DC

## 2016-09-11 ENCOUNTER — Telehealth: Payer: Self-pay

## 2016-09-11 NOTE — Telephone Encounter (Signed)
Jocelyn Lamer from [place] called asking what type of glucose meter you and pt talked about during his last visit.  I see a rx for some strips was sent to [facility].  When he was seeing Dr. Ileene Rubens he was Rx the one touch ultra meter. Please advise.

## 2016-09-11 NOTE — Telephone Encounter (Signed)
What brand does he want me to send in the strips for. If Mcarthur Rossetti can request a refill that would be best.

## 2016-11-07 ENCOUNTER — Ambulatory Visit (INDEPENDENT_AMBULATORY_CARE_PROVIDER_SITE_OTHER): Payer: Commercial Managed Care - HMO | Admitting: Family Medicine

## 2016-11-07 ENCOUNTER — Encounter: Payer: Self-pay | Admitting: Family Medicine

## 2016-11-07 VITALS — BP 128/78 | HR 68 | Wt 228.0 lb

## 2016-11-07 DIAGNOSIS — L821 Other seborrheic keratosis: Secondary | ICD-10-CM | POA: Diagnosis not present

## 2016-11-07 DIAGNOSIS — Z8739 Personal history of other diseases of the musculoskeletal system and connective tissue: Secondary | ICD-10-CM

## 2016-11-07 DIAGNOSIS — M62838 Other muscle spasm: Secondary | ICD-10-CM

## 2016-11-07 DIAGNOSIS — B079 Viral wart, unspecified: Secondary | ICD-10-CM | POA: Diagnosis not present

## 2016-11-07 DIAGNOSIS — L989 Disorder of the skin and subcutaneous tissue, unspecified: Secondary | ICD-10-CM

## 2016-11-07 NOTE — Patient Instructions (Signed)
Thank you for coming in today. Attend PT.  I will let you know about the skin results.  Return in a few weeks if not better.   TENS UNIT: This is helpful for muscle pain and spasm.   Search and Purchase a TENS 7000 2nd edition at www.tenspros.com. It should be less than $30.     TENS unit instructions: Do not shower or bathe with the unit on Turn the unit off before removing electrodes or batteries If the electrodes lose stickiness add a drop of water to the electrodes after they are disconnected from the unit and place on plastic sheet. If you continued to have difficulty, call the TENS unit company to purchase more electrodes. Do not apply lotion on the skin area prior to use. Make sure the skin is clean and dry as this will help prolong the life of the electrodes. After use, always check skin for unusual red areas, rash or other skin difficulties. If there are any skin problems, does not apply electrodes to the same area. Never remove the electrodes from the unit by pulling the wires. Do not use the TENS unit or electrodes other than as directed. Do not change electrode placement without consultating your therapist or physician. Keep 2 fingers with between each electrode. Wear time ratio is 2:1, on to off times.    For example on for 30 minutes off for 15 minutes and then on for 30 minutes off for 15 minutes    Cervical Strain and Sprain Rehab Ask your health care provider which exercises are safe for you. Do exercises exactly as told by your health care provider and adjust them as directed. It is normal to feel mild stretching, pulling, tightness, or discomfort as you do these exercises, but you should stop right away if you feel sudden pain or your pain gets worse.Do not begin these exercises until told by your health care provider. Stretching and range of motion exercises These exercises warm up your muscles and joints and improve the movement and flexibility of your neck. These  exercises also help to relieve pain, numbness, and tingling. Exercise A: Cervical side bend 1. Using good posture, sit on a stable chair or stand up. 2. Without moving your shoulders, slowly tilt your left / right ear to your shoulder until you feel a stretch in your neck muscles. You should be looking straight ahead. 3. Hold for __________ seconds. 4. Repeat with the other side of your neck. Repeat __________ times. Complete this exercise __________ times a day. Exercise B: Cervical rotation 1. Using good posture, sit on a stable chair or stand up. 2. Slowly turn your head to the side as if you are looking over your left / right shoulder.  Keep your eyes level with the ground.  Stop when you feel a stretch along the side and the back of your neck. 3. Hold for __________ seconds. 4. Repeat this by turning to your other side. Repeat __________ times. Complete this exercise __________ times a day. Exercise C: Thoracic extension and pectoral stretch 1. Roll a towel or a small blanket so it is about 4 inches (10 cm) in diameter. 2. Lie down on your back on a firm surface. 3. Put the towel lengthwise, under your spine in the middle of your back. It should not be not under your shoulder blades. The towel should line up with your spine from your middle back to your lower back. 4. Put your hands behind your head and let  your elbows fall out to your sides. 5. Hold for __________ seconds. Repeat __________ times. Complete this exercise __________ times a day. Strengthening exercises These exercises build strength and endurance in your neck. Endurance is the ability to use your muscles for a long time, even after your muscles get tired. Exercise D: Upper cervical flexion, isometric 1. Lie on your back with a thin pillow behind your head and a small rolled-up towel under your neck. 2. Gently tuck your chin toward your chest and nod your head down to look toward your feet. Do not lift your head off  the pillow. 3. Hold for __________ seconds. 4. Release the tension slowly. Relax your neck muscles completely before you repeat this exercise. Repeat __________ times. Complete this exercise __________ times a day. Exercise E: Cervical extension, isometric 1. Stand about 6 inches (15 cm) away from a wall, with your back facing the wall. 2. Place a soft object, about 6-8 inches (15-20 cm) in diameter, between the back of your head and the wall. A soft object could be a small pillow, a ball, or a folded towel. 3. Gently tilt your head back and press into the soft object. Keep your jaw and forehead relaxed. 4. Hold for __________ seconds. 5. Release the tension slowly. Relax your neck muscles completely before you repeat this exercise. Repeat __________ times. Complete this exercise __________ times a day. Posture and body mechanics   Body mechanics refers to the movements and positions of your body while you do your daily activities. Posture is part of body mechanics. Good posture and healthy body mechanics can help to relieve stress in your body's tissues and joints. Good posture means that your spine is in its natural S-curve position (your spine is neutral), your shoulders are pulled back slightly, and your head is not tipped forward. The following are general guidelines for applying improved posture and body mechanics to your everyday activities. Standing  When standing, keep your spine neutral and keep your feet about hip-width apart. Keep a slight bend in your knees. Your ears, shoulders, and hips should line up.  When you do a task in which you stand in one place for a long time, place one foot up on a stable object that is 2-4 inches (5-10 cm) high, such as a footstool. This helps keep your spine neutral. Sitting  When sitting, keep your spine neutral and your keep feet flat on the floor. Use a footrest, if necessary, and keep your thighs parallel to the floor. Avoid rounding your  shoulders, and avoid tilting your head forward.  When working at a desk or a computer, keep your desk at a height where your hands are slightly lower than your elbows. Slide your chair under your desk so you are close enough to maintain good posture.  When working at a computer, place your monitor at a height where you are looking straight ahead and you do not have to tilt your head forward or downward to look at the screen. Resting When lying down and resting, avoid positions that are most painful for you. Try to support your neck in a neutral position. You can use a contour pillow or a small rolled-up towel. Your pillow should support your neck but not push on it. This information is not intended to replace advice given to you by your health care provider. Make sure you discuss any questions you have with your health care provider. Document Released: 10/22/2005 Document Revised: 06/28/2016 Document Reviewed: 09/28/2015 Elsevier  Education  2017 [employer].  

## 2016-11-07 NOTE — Progress Notes (Signed)
Kevin Wood is a 71 y.o. male who presents to [facility] today for neck pain and skin lesion.  Neck pain: Patient has a 3 week history of worsening right-sided neck pain. He's had pain off and on for the last 20 years but over the last 3 weeks it's been worse recently. He denies any fevers or chills nausea vomiting or diarrhea. He denies any particular injury or radiating pain weakness or numbness. He's tried taking Aleve which does help. He has a history of cervical spine fusion years ago although he can't remember which level. He has also been using a heating pad and neck massage which does help.  Additionally he has multiple skin lesions on his back he would like to get checked out. He notes multiple lesions on his thoracic back however he has one particular lesion that is raised on his left upper shoulder on the back that is irritated and bothering him. He notes these lesions have not changed very much and he feels well otherwise.   Past Medical History:  Diagnosis Date  . Elevated blood pressure 12/12/2012  . H/O degenerative disc disease 12/17/2012  . History of nephrolithiasis 12/17/2012  . Hyperlipidemia 12/12/2012  . Lumbar vertebral fracture ([place]) [place]  . Type 2 diabetes mellitus ([place]) 12/17/2012  . Unspecified vitamin D deficiency 12/17/2012   Outside Records    Past Surgical History:  Procedure Laterality Date  . c spine fustion  1994  . colon cancer  father  . GALLBLADDER SURGERY  2012   Social History  Substance Use Topics  . Smoking status: Never Smoker  . Smokeless tobacco: Not on file  . Alcohol use 0.5 oz/week    1 drink(s) per week   family history includes Cancer in his father.  ROS as above:  Medications: Current Outpatient Prescriptions  Medication Sig Dispense Refill  . AMBULATORY NON FORMULARY MEDICATION Lou, and her of  choice with test strips - diagnosis E11.8, DM2  Check fasting blood sugar once daily and random blood sugar daily, checking a total of blood sugars twice daily 100 strip 11  . lisinopril-hydrochlorothiazide (PRINZIDE,ZESTORETIC) 20-12.5 MG tablet Take 1 tablet by mouth daily. 90 tablet 3  . simvastatin (ZOCOR) 40 MG tablet TAKE 1 TABLET DAILY 90 tablet 0   No current facility-administered medications for this visit.    No Known Allergies  Health Maintenance Health Maintenance  Topic Date Due  . HEMOGLOBIN A1C  01/17/2017  . OPHTHALMOLOGY EXAM  06/05/2017  . FOOT EXAM  07/12/2017  . Fecal DNA (Cologuard)  01/01/2019  . TETANUS/TDAP  07/26/2019  . INFLUENZA VACCINE  Completed  . ZOSTAVAX  Completed  . Hepatitis C Screening  Completed  . PNA vac Low Risk Adult  Completed     Exam:  BP 128/78   Pulse 68   Wt 228 lb (103.4 kg)   SpO2 99%   BMI 30.92 kg/m  Gen: Well NAD HEENT: EOMI,  MMM Lungs: Normal work of breathing. CTABL Heart: RRR no MRG Abd: NABS, Soft. Nondistended, Nontender Exts: Brisk capillary refill, warm and well perfused.  Skin: Multiple raised hyperpigmented lesions on back consistent in appearance with seborrheic keratosis. There is a 1 cm oval-shaped raised scaly lesion on the left posterior shoulder that is mildly irritated to touch. MSK: Cervical spine is nontender. Motion is limited. Limited extension normal flexion limited rotation and lateral flexion bilaterally. Upper extremity strength is equal  and normal throughout. Sensation is intact throughout.  Shave biopsy: Consent obtained timeout performed. Skin cleaned with alcohol. Cold spray applied. 1 ml of Lidocaine injected achieving good anesthesia. Skin was again cleaned with alcohol. A deep shave biopsy was used to remove the skin lesion. A dressing was applied. Patient tolerated the procedure well.   No results found for this or any previous visit (from the past 72 hour(s)). No results  found.    Assessment and Plan: 71 y.o. male with  Cervical paraspinal muscle strain: Likely cause of neck pain. Certainly there is a component of arthritis as well. Plan for referral to physical therapy NSAIDs TENS unit and heating pad. Recommend over-the-counter GI prophylaxis with long duration NSAIDs.  Skin lesion: Patient has multiple seborrheic keratoses. The irritated lesion on the left posterior shoulder is either an irritated seborrheic keratosis or a verruca or potentially a more concerning lesion. Shave biopsy performed today.   Orders Placed This Encounter  Procedures  . Ambulatory referral to Physical Therapy    Referral Priority:   Routine    Referral Type:   Physical Medicine    Referral Reason:   Specialty Services Required    Requested Specialty:   Physical Therapy    Number of Visits Requested:   1    Discussed warning signs or symptoms. Please see discharge instructions. Patient expresses understanding.

## 2016-11-14 ENCOUNTER — Encounter: Payer: Self-pay | Admitting: Rehabilitative and Restorative Service Providers"

## 2016-11-14 ENCOUNTER — Ambulatory Visit (INDEPENDENT_AMBULATORY_CARE_PROVIDER_SITE_OTHER): Payer: Commercial Managed Care - HMO | Admitting: Rehabilitative and Restorative Service Providers"

## 2016-11-14 DIAGNOSIS — R29898 Other symptoms and signs involving the musculoskeletal system: Secondary | ICD-10-CM

## 2016-11-14 DIAGNOSIS — R293 Abnormal posture: Secondary | ICD-10-CM

## 2016-11-14 NOTE — Patient Instructions (Signed)
Axial Extension (Chin Tuck)    Pull chin in and lengthen back of neck. Hold __10-15__ seconds while counting out loud. Repeat _5-10___ times. Do several____ sessions per day.    Scapular Retraction (Standing)  Can use swim noodle to cue posture and encourage thoracic extension.  With arms at sides, pinch shoulder blades down and back. Hold 10 sec Repeat ___10_ times per set. Do __1-2__ sets per session. Do __several__ sessions per day.  Scapula Adduction With Pectoralis Stretch: Low - Standing   Shoulders at 45 hands even with shoulders, keeping weight through legs, shift weight forward until you feel pull or stretch through the front of your chest. Hold _30__ seconds. Do _3__ times, _2-4__ times per day.   Scapula Adduction With Pectoralis Stretch: Mid-Range - Standing   Shoulders at 90 elbows even with shoulders, keeping weight through legs, shift weight forward until you feel pull or strength through the front of your chest. Hold __30_ seconds. Do _3__ times, __2-4_ times per day.   Scapula Adduction With Pectoralis Stretch: High - Standing   Shoulders at 120 hands up high on the doorway, keeping weight on feet, shift weight forward until you feel pull or stretch through the front of your chest. Hold _30__ seconds. Do _3__ times, _2-3__ times per day.

## 2016-11-14 NOTE — Therapy (Signed)
[facility] [place], [place], [address] Phone: [phone]   Fax:  [phone]  Physical Therapy Evaluation  Patient Details  Name: Kevin Wood MRN: [id number] Date of Birth: 1946-09-18 Referring Provider: Dr Lynne Leader  Encounter Date: 11/14/2016      PT End of Session - 11/14/16 0837    Visit Number 1   Number of Visits 12   Date for PT Re-Evaluation 12/26/16   PT Start Time 0800   PT Stop Time 0849   PT Time Calculation (min) 49 min   Activity Tolerance Patient tolerated treatment well      Past Medical History:  Diagnosis Date  . Elevated blood pressure 12/12/2012  . H/O degenerative disc disease 12/17/2012  . History of nephrolithiasis 12/17/2012  . Hyperlipidemia 12/12/2012  . Lumbar vertebral fracture ([place]) [place]  . Type 2 diabetes mellitus ([place]) 12/17/2012  . Unspecified vitamin D deficiency 12/17/2012   Outside Records     Past Surgical History:  Procedure Laterality Date  . c spine fustion  1994  . colon cancer  father  . GALLBLADDER SURGERY  2012    There were no vitals filed for this visit.       Subjective Assessment - 11/14/16 0808    Subjective Ilyass reports that he has been experiencing headaces over the past couple of months which seem to come from hids neck. His neck is getting stiffer and stiffer for several years. Surgery for cervical fusioin ~ 20 years ago. Has experienced stiffness since that time. Feels better standing or walking. Symptoms in back and neck are worse with sitting. Now has a massage chair and rolls a towel to place behind back when sitting.    Pertinent History pre-dibatic; LBP; fx lumbar spine ~25 years ago; fell in the shower a few months ago tear meniscus Lt knee - doing well    How long can you sit comfortably? 5 min    How long can you stand comfortably? 12 hours    How long can you walk comfortably? no limit   Diagnostic tests xrays    Patient Stated Goals loosen up the neck and see what's going on    Currently in Pain? Yes   Pain Score 3    Pain Location Neck   Pain Orientation Lower;Right;Left   Pain Descriptors / Indicators Aching   Pain Type Chronic pain   Pain Radiating Towards into the base of head and can create HA    Pain Onset More than a month ago   Pain Frequency Constant   Aggravating Factors  sitting; looking up; looking Lt/Rt; turning head when on his motorcycle   Pain Relieving Factors towel behind neck; massage chair            [facility] PT Assessment - 11/14/16 0001      Assessment   Medical Diagnosis cervical paraspinal muscle spasms   Referring Provider Dr Lynne Leader   Onset Date/Surgical Date 10/24/16   Hand Dominance Left   Next MD Visit as needed    Prior Therapy after surgery ~ 20 years ago      Precautions   Precautions None     Balance Screen   Has the patient fallen in the past 6 months Yes   How many times? 1   Has the patient had a decrease in activity level because of a fear of falling?  No   Is the patient reluctant to leave their  home because of a fear of falling?  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    Leisure yard work; motorcycles; gym 2-3 days/wk [employer] some swimming - breast stroke      Observation/Other Assessments   Focus on Therapeutic Outcomes (FOTO)  39% limitation      Sensation   Additional Comments WNL's er pt report      Posture/Postural Control   Posture Comments significant head forward posture; increased thoracic kyphosis; scapulae abducted and rotated along the thoracic wall; arms in IR at sides      AROM   Cervical Flexion 49   Cervical Extension 18   Cervical - Right Side Bend 12   Cervical - Left  Side Bend 8   Cervical - Right Rotation 53   Cervical - Left Rotation 47     Strength   Overall Strength Comments 5/5 bilat UE except horiz add/posterior shoulder girdle 4+/5      Palpation   Spinal mobility hypomobility thoracic spine    Palpation comment tight pecs/ant shd; upper trap/leveator; ant/lat/post cervical musculature      Special Tests    Special Tests --  cervical compression/distraction (-)                    [facility] Adult PT Treatment/Exercise - 11/14/16 0001      Therapeutic Activites    Therapeutic Activities --  myofacial ball release work     Neuro Re-ed    Neuro Re-ed Details  working on posture and alignment encouraging improved axial extension; engaging posterior shcouler girdle musculature      Neck Exercises: Standing   Neck Retraction --  3 reps 10-15 sec hold      Shoulder Exercises: Standing   Other Standing Exercises scap squeeze 10 sec x 10 with noodle     Shoulder Exercises: Stretch   Other Shoulder Stretches 3 positions doorway stretch 30 sec x 2 each position                PT Education - 11/14/16 0836    Education provided Yes   Education Details initiated postural correction; HEP   Person(s) Educated Patient   Methods Explanation;Demonstration;Tactile cues;Verbal cues;Handout   Comprehension Verbalized understanding;Returned demonstration;Verbal cues required;Tactile cues required             PT Long Term Goals - 11/14/16 1155      PT LONG TERM GOAL #1   Title Improve posture and alignment with patient to demonstrate more upright posture/position of head over chest/shoudlers allowing improved cervical positioning and improved movement quality thus decreasing pain and improving function 12/26/16   Time 6   Period Weeks   Status New     PT LONG TERM GOAL #2   Title Increase cervical ROM in lateral flexion and rotation by 5-8 degrees 12/26/16   Time 6   Period Weeks   Status New     PT LONG TERM GOAL #3    Title Patient to report decreased stiffness with movement and improved tolerance for sitting to 10-15 min with minimal to no increase in pain 12/26/16   Time 6   Period Weeks   Status New  PT LONG TERM GOAL #4   Title Independent in HEP 12/26/16   Time 6   Period Weeks   Status New     PT LONG TERM GOAL #5   Title Improve FOTO to </= 33% limitation 12/26/16   Time 6   Period Weeks   Status New               Plan - 11/14/16 [id number]    Clinical Impression Statement Patient presents with increase in cervical tightness and pain as well as onset of occipital headaches ~ 2 weeks ago. He has poor posture and alignment with significant head forward posture; decreased AROM cervical spine and end ranges bilat shoudlers; shoudlers rounded and elevated; head of the humerus anterior in orientation; increased thoracic kyphosis; decreased posterior shoulder girdle strength; muscular tightness to palpation through pecs/upper trap/leveator/cervical musculature bilaterally.   Rehab Potential Good   PT Frequency 2x / week   PT Duration 6 weeks   PT Treatment/Interventions Patient/family education;ADLs/Self Care Home Management;Neuromuscular re-education;Cryotherapy;Electrical Stimulation;Iontophoresis 4mg /ml Dexamethasone;Moist Heat;Ultrasound;Traction;Dry needling;Manual techniques;Taping   PT Next Visit Plan progress with postural correction; stretching; posterior shoulder girdle strengthening; thoracic spine mobs; manual work through the thoracic and cervical musculature; modalities as indicated    Consulted and Agree with Plan of Care Patient      Patient will benefit from skilled therapeutic intervention in order to improve the following deficits and impairments:  Postural dysfunction, Improper body mechanics, Increased fascial restricitons, Increased muscle spasms, Decreased strength, Pain, Decreased range of motion, Decreased activity tolerance  Visit Diagnosis: Other symptoms and signs  involving the musculoskeletal system - Plan: PT plan of care cert/re-cert  Abnormal posture - Plan: PT plan of care cert/re-cert     Problem List Patient Active Problem List   Diagnosis Date Noted  . Seborrheic keratoses 11/07/2016  . Acute medial meniscus tear of left knee 05/15/2016  . Internal hemorrhoids 04/12/2015  . 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 Essential hypertension 08/11/2013  . Type 2 diabetes mellitus ([place]) 12/17/2012  . History of nephrolithiasis 12/17/2012  . Unspecified vitamin D deficiency 12/17/2012  . Excessive cerumen in ear canal 12/17/2012  . H/O degenerative disc disease 12/17/2012  . 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 Family history of colon cancer 12/17/2012  . Hyperlipidemia 12/12/2012    Siobahn Worsley Nilda Simmer PT, MPH  11/14/2016, 12:04 PM  [facility] [place], [place], [address] Phone: [phone]   Fax:  [phone]  Name: Jaheir Paisley MRN: [id number] Date of Birth: 09/29/1946

## 2016-11-19 ENCOUNTER — Ambulatory Visit (INDEPENDENT_AMBULATORY_CARE_PROVIDER_SITE_OTHER): Payer: Commercial Managed Care - HMO | Admitting: Physical Therapy

## 2016-11-19 DIAGNOSIS — R293 Abnormal posture: Secondary | ICD-10-CM

## 2016-11-19 DIAGNOSIS — R29898 Other symptoms and signs involving the musculoskeletal system: Secondary | ICD-10-CM

## 2016-11-19 NOTE — Therapy (Signed)
[facility] [place], [place], [address] Phone: [phone]   Fax:  [phone]  Physical Therapy Treatment  Patient Details  Name: Kevin Wood MRN: [id number] Date of Birth: [birth year] Referring Provider: Dr. Lynne Leader   Encounter Date: 11/19/2016      PT End of Session - 11/19/16 0757    Visit Number 2   Number of Visits 12   Date for PT Re-Evaluation 12/26/16   PT Start Time 0756   PT Stop Time 0840   PT Time Calculation (min) 44 min   Activity Tolerance Patient tolerated treatment well      Past Medical History:  Diagnosis Date  . Elevated blood pressure 12/12/2012  . H/O degenerative disc disease 12/17/2012  . History of nephrolithiasis 12/17/2012  . Hyperlipidemia 12/12/2012  . Lumbar vertebral fracture ([place]) [place]  . Type 2 diabetes mellitus ([place]) 12/17/2012  . Unspecified vitamin D deficiency 12/17/2012   Outside Records     Past Surgical History:  Procedure Laterality Date  . c spine fustion  1994  . colon cancer  father  . GALLBLADDER SURGERY  2012    There were no vitals filed for this visit.      Subjective Assessment - 11/19/16 0757    Subjective Pt reports no new changes since last visit. He reports he has been performing HEP 3x/day.  No noticeable changes.    Currently in Pain? Yes   Pain Score 2    Pain Location Neck   Pain Orientation Lower;Left;Right   Pain Descriptors / Indicators Aching   Aggravating Factors  sitting; turning head when driving    Pain Relieving Factors towel behind neck, massage chair.             [facility] PT Assessment - 11/19/16 0001      Assessment   Medical Diagnosis cervical paraspinal muscle spasms   Referring Provider Dr. Lynne Leader    Onset Date/Surgical Date 10/24/16   Hand Dominance Left   Next MD Visit PRN     AROM   Cervical Flexion 48   Cervical Extension 25   Cervical - Right Side Bend 15   Cervical - Left Side  Bend 12   Cervical - Right Rotation 53   Cervical - Left Rotation 38                     [facility] Adult PT Treatment/Exercise - 11/19/16 0001      Self-Care   Self-Care Other Self-Care Comments   Other Self-Care Comments  Pt instructed in self massage / MFR with ball to pecs and upper back.  Pt verbalized understanding and returned demo.      Neck Exercises: Standing   Neck Retraction 5 secs;5 reps     Neck Exercises: Supine   Cervical Rotation 5 reps;Right;Left     Shoulder Exercises: Standing   Other Standing Exercises scap squeeze 10 sec x 10 with noodle     Shoulder Exercises: Stretch   Other Shoulder Stretches 3 positions doorway stretch 30 sec x 2 each position     Modalities   Modalities --  declined. will do at home.      Manual Therapy   Manual Therapy Passive ROM;Myofascial release;Soft tissue mobilization   Soft tissue mobilization pin and stretch to Rt/Lt  levator, upper trap, scalene.    Myofascial Release platysma, Rt/Lt pec release, suboccipital release.    Passive ROM cervical lateral flexion,  rotation.  Gentle traction manually      Neck Exercises: Stretches   Upper Trapezius Stretch 2 reps;10 seconds  very limited.                      PT Long Term Goals - 11/19/16 0805      PT LONG TERM GOAL #1   Title Improve posture and alignment with patient to demonstrate more upright posture/position of head over chest/shoudlers allowing improved cervical positioning and improved movement quality thus decreasing pain and improving function 12/26/16   Time 6   Period Weeks   Status On-going     PT LONG TERM GOAL #2   Title Increase cervical ROM in lateral flexion and rotation by 5-8 degrees 12/26/16   Time 6   Period Weeks   Status On-going     PT LONG TERM GOAL #3   Title Patient to report decreased stiffness with movement and improved tolerance for sitting to 10-15 min with minimal to no increase in pain 12/26/16   Time 6   Period  Weeks   Status On-going     PT LONG TERM GOAL #4   Title Independent in HEP 12/26/16   Time 6   Period Weeks   Status On-going     PT LONG TERM GOAL #5   Title Improve FOTO to </= 33% limitation 12/26/16   Time 6   Period Weeks   Status On-going               Plan - 11/19/16 1227    Clinical Impression Statement Pt demonstrated slight improvement in Rt/Lt lateral flexion.  Lt rotation and Lt lateral flexion ROM continue to be limited.  Pt reported increased discomfort in back of neck paraspinals with MFR to bilat ant neck structures; slightly reduced when moving away from area.  Pt declied modalities as he can do at home as needed.    Rehab Potential Good   PT Frequency 2x / week   PT Duration 6 weeks   PT Treatment/Interventions Patient/family education;ADLs/Self Care Home Management;Neuromuscular re-education;Cryotherapy;Electrical Stimulation;Iontophoresis 4mg /ml Dexamethasone;Moist Heat;Ultrasound;Traction;Dry needling;Manual techniques;Taping   PT Next Visit Plan thoracic spine mobs; manual work through the thoracic and cervical musculature; postural correction.    Consulted and Agree with Plan of Care Patient      Patient will benefit from skilled therapeutic intervention in order to improve the following deficits and impairments:  Postural dysfunction, Improper body mechanics, Increased fascial restricitons, Increased muscle spasms, Decreased strength, Pain, Decreased range of motion, Decreased activity tolerance  Visit Diagnosis: Other symptoms and signs involving the musculoskeletal system  Abnormal posture     Problem List Patient Active Problem List   Diagnosis Date Noted  . Seborrheic keratoses 11/07/2016  . Acute medial meniscus tear of left knee 05/15/2016  . Internal hemorrhoids 04/12/2015  . 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 Essential hypertension 08/11/2013  . Type 2 diabetes mellitus ([place]) 12/17/2012  . History of nephrolithiasis  12/17/2012  . Unspecified vitamin D deficiency 12/17/2012  . Excessive cerumen in ear canal 12/17/2012  . H/O degenerative disc disease 12/17/2012  . 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 Family history of colon cancer 12/17/2012  . Hyperlipidemia 12/12/2012   Kerin Perna, PTA 11/19/16 12:30 PM  [facility] [place], [place], [address] Phone: [phone]   Fax:  [phone]  Name: Kohlby Gillim MRN: [id number] Date of Birth: 09-30-1946

## 2016-11-22 ENCOUNTER — Encounter: Payer: Commercial Managed Care - HMO | Admitting: Rehabilitative and Restorative Service Providers"

## 2016-11-24 ENCOUNTER — Ambulatory Visit (INDEPENDENT_AMBULATORY_CARE_PROVIDER_SITE_OTHER): Payer: Commercial Managed Care - HMO | Admitting: Physical Therapy

## 2016-11-24 DIAGNOSIS — R29898 Other symptoms and signs involving the musculoskeletal system: Secondary | ICD-10-CM

## 2016-11-24 DIAGNOSIS — R293 Abnormal posture: Secondary | ICD-10-CM | POA: Diagnosis not present

## 2016-11-24 NOTE — Therapy (Signed)
[facility] [place], [place], [address] Phone: [phone]   Fax:  [phone]  Physical Therapy Treatment  Patient Details  Name: Kevin Wood MRN: [id number] Date of Birth: [birth year] Referring Provider: Dr. Lynne Leader   Encounter Date: 11/24/2016      PT End of Session - 11/24/16 0809    Visit Number 3   Number of Visits 12   Date for PT Re-Evaluation 12/26/16   PT Start Time 0803   PT Stop Time 0900   PT Time Calculation (min) 57 min      Past Medical History:  Diagnosis Date  . Elevated blood pressure 12/12/2012  . H/O degenerative disc disease 12/17/2012  . History of nephrolithiasis 12/17/2012  . Hyperlipidemia 12/12/2012  . Lumbar vertebral fracture ([place]) [place]  . Type 2 diabetes mellitus ([place]) 12/17/2012  . Unspecified vitamin D deficiency 12/17/2012   Outside Records     Past Surgical History:  Procedure Laterality Date  . c spine fustion  1994  . colon cancer  father  . GALLBLADDER SURGERY  2012    There were no vitals filed for this visit.      Subjective Assessment - 11/24/16 0809    Subjective "The good news is....my neck doesn't hurt, the bad new is...my arms are sore now, from the exercises".      Currently in Pain? Yes   Pain Score 2    Pain Location Shoulder   Pain Orientation Right;Left   Aggravating Factors  sitting, some exercises    Pain Relieving Factors towel behind neck, massage chair (currently broken)             [facility] PT Assessment - 11/24/16 0001      Assessment   Medical Diagnosis cervical paraspinal muscle spasms   Referring Provider Dr. Lynne Leader    Onset Date/Surgical Date 10/24/16   Hand Dominance Left   Next MD Visit PRN     AROM   Cervical - Right Side Bend 20   Cervical - Left Side Bend 15   Cervical - Right Rotation 52   Cervical - Left Rotation 45          [facility] Adult PT Treatment/Exercise - 11/24/16 0001      Neck  Exercises: Standing   Other Standing Exercises scap squeeze around noodle, 5 sec hold x 10 reps;  bilat shoulder ER with green band (scap squeeze) x 10 reps.  Lat stretch with arms on door frame, back of truck (multiple cues to correct form).        Shoulder Exercises: Stretch   Cross Chest Stretch 2 reps;30 seconds   Other Shoulder Stretches 3 positions doorway stretch 30 sec x 3 each position   Other Shoulder Stretches tricep stretch x 30 sec each arm.      Modalities   Modalities Electrical Stimulation;Moist Heat     Moist Heat Therapy   Number Minutes Moist Heat 15 Minutes   Moist Heat Location Cervical  thoracic     Electrical Stimulation   Electrical Stimulation Location C7- upper thoracic paraspinals.    Electrical Stimulation Action IFC   Electrical Stimulation Parameters to pt tolerance    Electrical Stimulation Goals Pain;Tone     Manual Therapy   Manual Therapy Soft tissue mobilization;Myofascial release   Manual therapy comments pt prone    Soft tissue mobilization TPR to bilat rhomboid, levator;  cross fiber to mid trap and levator  Myofascial Release to upper/mid trap/ levator.      Neck Exercises: Stretches   Upper Trapezius Stretch 2 reps;10 seconds  very limited.                      PT Long Term Goals - 11/19/16 0805      PT LONG TERM GOAL #1   Title Improve posture and alignment with patient to demonstrate more upright posture/position of head over chest/shoudlers allowing improved cervical positioning and improved movement quality thus decreasing pain and improving function 12/26/16   Time 6   Period Weeks   Status On-going     PT LONG TERM GOAL #2   Title Increase cervical ROM in lateral flexion and rotation by 5-8 degrees 12/26/16   Time 6   Period Weeks   Status On-going     PT LONG TERM GOAL #3   Title Patient to report decreased stiffness with movement and improved tolerance for sitting to 10-15 min with minimal to no increase in  pain 12/26/16   Time 6   Period Weeks   Status On-going     PT LONG TERM GOAL #4   Title Independent in HEP 12/26/16   Time 6   Period Weeks   Status On-going     PT LONG TERM GOAL #5   Title Improve FOTO to </= 33% limitation 12/26/16   Time 6   Period Weeks   Status On-going               Plan - 11/24/16 1220    Clinical Impression Statement Pt demonstrated improved lateral cervical flexion ROM.  He tolerated all exercises without increase in symptoms.  Pt reported feeling "looser" in neck and shoulders at end of session.  Progressing towards goals.    Rehab Potential Good   PT Frequency 2x / week   PT Duration 6 weeks   PT Treatment/Interventions Patient/family education;ADLs/Self Care Home Management;Neuromuscular re-education;Cryotherapy;Electrical Stimulation;Iontophoresis 4mg /ml Dexamethasone;Moist Heat;Ultrasound;Traction;Dry needling;Manual techniques;Taping   PT Next Visit Plan thoracic spine mobs; manual work through the thoracic and cervical musculature; postural correction.    Consulted and Agree with Plan of Care Patient      Patient will benefit from skilled therapeutic intervention in order to improve the following deficits and impairments:  Postural dysfunction, Improper body mechanics, Increased fascial restricitons, Increased muscle spasms, Decreased strength, Pain, Decreased range of motion, Decreased activity tolerance  Visit Diagnosis: Other symptoms and signs involving the musculoskeletal system  Abnormal posture     Problem List Patient Active Problem List   Diagnosis Date Noted  . Seborrheic keratoses 11/07/2016  . Acute medial meniscus tear of left knee 05/15/2016  . Internal hemorrhoids 04/12/2015  . 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 Essential hypertension 08/11/2013  . Type 2 diabetes mellitus ([place]) 12/17/2012  . History of nephrolithiasis 12/17/2012  . Unspecified vitamin D deficiency 12/17/2012  . Excessive cerumen in ear  [place] 12/17/2012  . H/O degenerative disc disease 12/17/2012  . 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 Family history of colon cancer 12/17/2012  . Hyperlipidemia 12/12/2012   Kerin Perna, PTA 11/24/16 12:22 PM  [facility] [place], [place], [address] Phone: [phone]   Fax:  [phone]  Name: Marwin Cote MRN: [id number] Date of Birth: 05/14/1946

## 2016-11-27 ENCOUNTER — Ambulatory Visit (INDEPENDENT_AMBULATORY_CARE_PROVIDER_SITE_OTHER): Payer: Commercial Managed Care - HMO | Admitting: Rehabilitative and Restorative Service Providers"

## 2016-11-27 ENCOUNTER — Encounter: Payer: Self-pay | Admitting: Rehabilitative and Restorative Service Providers"

## 2016-11-27 DIAGNOSIS — R293 Abnormal posture: Secondary | ICD-10-CM | POA: Diagnosis not present

## 2016-11-27 DIAGNOSIS — R29898 Other symptoms and signs involving the musculoskeletal system: Secondary | ICD-10-CM | POA: Diagnosis not present

## 2016-11-27 NOTE — Patient Instructions (Addendum)
Resisted External Rotation: in Neutral - Bilateral   PALMS UP Sit or stand, tubing in both hands, elbows at sides, bent to 90, forearms forward. Pinch shoulder blades together and rotate forearms out. Keep elbows at sides. Repeat __10__ times per set. Do _2-3___ sets per session. Do _2-3___ sessions per day.   Low Row: Standing   Face anchor, feet shoulder width apart. Palms up, pull arms back, squeezing shoulder blades together. Repeat 10__ times per set. Do 2-3__ sets per session. Do 2-3__ sessions per week. Anchor Height: Waist   Strengthening: Resisted Extension   Hold tubing in right hand, arm forward. Pull arm back, elbow straight. Repeat _10___ times per set. Do 2-3____ sets per session. Do 2-3____ sessions per day.  Lying on back arms at side gradually bring arms up toward 90-120 deg  SUPINE Tips A    Being in the supine position means to be lying on the back. Lying on the back is the position of least compression on the bones and discs of the spine, and helps to re-align the natural curves of the back. Work toward 5 min can bend elbows to release stress and then straighten again

## 2016-11-27 NOTE — Therapy (Signed)
[place], [place], [address] Phone: [phone]   Fax:  [phone]  Physical Therapy Treatment  Patient Details  Name: Kevin Wood MRN: [id number] Date of Birth: September 18, 1946 Referring Provider: Dr Lynne Leader  Encounter Date: 11/27/2016      PT End of Session - 11/27/16 0802    Visit Number 4   Number of Visits 12   Date for PT Re-Evaluation 12/26/16   PT Start Time 0802   PT Stop Time 0900   PT Time Calculation (min) 58 min   Activity Tolerance Patient tolerated treatment well      Past Medical History:  Diagnosis Date  . Elevated blood pressure 12/12/2012  . H/O degenerative disc disease 12/17/2012  . History of nephrolithiasis 12/17/2012  . Hyperlipidemia 12/12/2012  . Lumbar vertebral fracture ([place]) [place]  . Type 2 diabetes mellitus ([place]) 12/17/2012  . Unspecified vitamin D deficiency 12/17/2012   Outside Records     Past Surgical History:  Procedure Laterality Date  . c spine fustion  1994  . colon cancer  father  . GALLBLADDER SURGERY  2012    There were no vitals filed for this visit.      Subjective Assessment - 11/27/16 0803    Subjective Patient reports that he is feeling better. His neck is stiff but less painful.    Currently in Pain? Yes   Pain Score 2    Pain Location Neck   Pain Orientation Right;Left   Pain Descriptors / Indicators Tightness   Pain Type Chronic pain   Pain Onset More than a month ago   Pain Frequency Intermittent   Aggravating Factors  sitting   Pain Relieving Factors towel behind neck; massage chair; TENS unit            [facility] PT Assessment - 11/27/16 0001      Assessment   Medical Diagnosis cervical paraspinal muscle spasms   Referring Provider Dr Lynne Leader   Onset Date/Surgical Date 10/24/16   Hand Dominance Left   Next MD Visit PRN     AROM   Cervical Flexion 44   Cervical Extension 42   Cervical - Right Side  Bend 24   Cervical - Left Side Bend 23   Cervical - Right Rotation 52   Cervical - Left Rotation 46                     [facility] Adult PT Treatment/Exercise - 11/27/16 0001      Neck Exercises: Supine   Neck Retraction 5 reps;10 secs     Shoulder Exercises: Standing   Extension Strengthening;Both;20 reps;Theraband   Theraband Level (Shoulder Extension) Level 2 (Red)   Row Strengthening;Both;20 reps;Theraband   Theraband Level (Shoulder Row) Level 2 (Red)   Retraction Strengthening;Both;10 reps;Theraband   Theraband Level (Shoulder Retraction) Level 2 (Red)   Other Standing Exercises scap squeeze 10 sec x 10 with noodle     Shoulder Exercises: Stretch   Other Shoulder Stretches 3 positions doorway stretch 30 sec x 3 each position   Other Shoulder Stretches tricep stretch x 30 sec each arm.      Moist Heat Therapy   Number Minutes Moist Heat 20 Minutes   Moist Heat Location Cervical  thoracic     Electrical Stimulation   Electrical Stimulation Location bilat upper cervical to upper thoracic paraspinals   Electrical Stimulation Action IFC   Electrical Stimulation Parameters to tolerance  Electrical Stimulation Goals Pain;Tone     Manual Therapy   Manual therapy comments pt sitting and supine    Joint Mobilization T spine CPA and lateral mobsin sitting; cervical CPA mobs in supine    Soft tissue mobilization deep tissue work through anterior and posterior cervical musculature as well as upper traps    Myofascial Release upper traps    Passive ROM cervical flexion; flexion with slight rotation; rotation iin supine; lateral flexion                 PT Education - 11/27/16 [id number]    Education provided Yes   Education Details HEP   Person(s) Educated Patient   Methods Explanation;Demonstration;Tactile cues;Verbal cues;Handout   Comprehension Verbalized understanding;Returned demonstration;Verbal cues required;Tactile cues required             PT Long  Term Goals - 11/27/16 0809      PT LONG TERM GOAL #1   Title Improve posture and alignment with patient to demonstrate more upright posture/position of head over chest/shoudlers allowing improved cervical positioning and improved movement quality thus decreasing pain and improving function 12/26/16   Time 6   Period Weeks   Status On-going     PT LONG TERM GOAL #2   Title Increase cervical ROM in lateral flexion and rotation by 5-8 degrees 12/26/16   Time 6   Period Weeks   Status On-going     PT LONG TERM GOAL #3   Title Patient to report decreased stiffness with movement and improved tolerance for sitting to 10-15 min with minimal to no increase in pain 12/26/16   Time 6   Period Weeks   Status On-going     PT LONG TERM GOAL #4   Title Independent in HEP 12/26/16   Time 6   Period Weeks   Status On-going     PT LONG TERM GOAL #5   Title Improve FOTO to </= 33% limitation 12/26/16   Time 6   Period Weeks   Status On-going               Plan - 11/27/16 0807    Clinical Impression Statement Progressing gradually with decreasing pain and improved mobility. Patient has been consistent with HEP and has a TENS unit at home as well. Progressing toward stated goals of therapy.    Rehab Potential Good   PT Frequency 2x / week   PT Duration 6 weeks   PT Treatment/Interventions Patient/family education;ADLs/Self Care Home Management;Neuromuscular re-education;Cryotherapy;Electrical Stimulation;Iontophoresis 4mg /ml Dexamethasone;Moist Heat;Ultrasound;Traction;Dry needling;Manual techniques;Taping   PT Next Visit Plan thoracic spine mobs; manual work through the thoracic and cervical musculature; postural correction.    Consulted and Agree with Plan of Care Patient      Patient will benefit from skilled therapeutic intervention in order to improve the following deficits and impairments:  Postural dysfunction, Improper body mechanics, Increased fascial restricitons, Increased muscle  spasms, Decreased strength, Pain, Decreased range of motion, Decreased activity tolerance  Visit Diagnosis: Other symptoms and signs involving the musculoskeletal system  Abnormal posture     Problem List Patient Active Problem List   Diagnosis Date Noted  . Seborrheic keratoses 11/07/2016  . Acute medial meniscus tear of left knee 05/15/2016  . Internal hemorrhoids 04/12/2015  . 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 Essential hypertension 08/11/2013  . Type 2 diabetes mellitus ([place]) 12/17/2012  . History of nephrolithiasis 12/17/2012  . Unspecified vitamin D deficiency 12/17/2012  . Excessive cerumen in ear canal 12/17/2012  .  H/O degenerative disc disease 12/17/2012  . 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 Family history of colon cancer 12/17/2012  . Hyperlipidemia 12/12/2012    Darris Carachure Nilda Simmer PT, MPH  11/27/2016, 8:44 AM  [facility] [place], [place], [address] Phone: [phone]   Fax:  [phone]  Name: Iokepa Liscio MRN: [id number] Date of Birth: 02/15/1946

## 2016-11-30 ENCOUNTER — Ambulatory Visit (INDEPENDENT_AMBULATORY_CARE_PROVIDER_SITE_OTHER): Payer: Commercial Managed Care - HMO | Admitting: Physical Therapy

## 2016-11-30 DIAGNOSIS — R293 Abnormal posture: Secondary | ICD-10-CM | POA: Diagnosis not present

## 2016-11-30 DIAGNOSIS — R29898 Other symptoms and signs involving the musculoskeletal system: Secondary | ICD-10-CM | POA: Diagnosis not present

## 2016-11-30 NOTE — Therapy (Addendum)
[place], [place], [address] Phone: [phone]   Fax:  [phone]  Physical Therapy Treatment  Patient Details  Name: Kevin Wood MRN: [id number] Date of Birth: [birth year] Referring Provider: Dr. Lynne Leader  Encounter Date: 11/30/2016      PT End of Session - 11/30/16 0853    Visit Number 5   Number of Visits 12   Date for PT Re-Evaluation 12/26/16   PT Start Time 0848   PT Stop Time 0917   PT Time Calculation (min) 29 min   Activity Tolerance Patient tolerated treatment well   Behavior During Therapy [facility] for tasks assessed/performed      Past Medical History:  Diagnosis Date  . Elevated blood pressure 12/12/2012  . H/O degenerative disc disease 12/17/2012  . History of nephrolithiasis 12/17/2012  . Hyperlipidemia 12/12/2012  . Lumbar vertebral fracture ([place]) [place]  . Type 2 diabetes mellitus ([place]) 12/17/2012  . Unspecified vitamin D deficiency 12/17/2012   Outside Records     Past Surgical History:  Procedure Laterality Date  . c spine fustion  1994  . colon cancer  father  . GALLBLADDER SURGERY  2012    There were no vitals filed for this visit.      Subjective Assessment - 11/30/16 0853    Subjective Pt reports he has no pain, just a little stiffness.  His massage chair is fixed. He used it this morning, then did his exercises.    Currently in Pain? No/denies   Pain Score 0-No pain            [facility] PT Assessment - 11/30/16 0001      Assessment   Medical Diagnosis cervical paraspinal muscle spasms   Referring Provider Dr. Lynne Leader   Onset Date/Surgical Date 10/24/16   Hand Dominance Left   Next MD Visit PRN           [facility] Adult PT Treatment/Exercise - 11/30/16 0001      Neck Exercises: Machines for Strengthening   UBE (Upper Arm Bike) L3:  4 min standing, alternating directions.    Other Machines for Strengthening lat pull down 50#, 10 reps  (to check form) - encouraged scap retraction, neutral head.      Shoulder Exercises: Standing   External Rotation Both;15 reps;Theraband  with scap squeeze around pool noodle.    Theraband Level (Shoulder External Rotation) Level 3 (Green)   Flexion Strengthening;Both;Weights;10 reps  dowel with 3#. scap squeeze with pool noodle. 3 sets   Other Standing Exercises D2 flexion (sash) with red band x 10 reps each arm. mirror for visual feeback.  Rowing - green band x 15 reps     Shoulder Exercises: Stretch   Cross Chest Stretch 1 rep;10 seconds   Table Stretch - Flexion 2 reps;30 seconds   Other Shoulder Stretches 3 positions doorway stretch 30 sec x 2 each position     Modalities   Modalities --  declined.      Neck Exercises: Stretches   Upper Trapezius Stretch 2 reps;10 seconds  improving           PT Long Term Goals - 11/30/16 0914      PT LONG TERM GOAL #1   Title Improve posture and alignment with patient to demonstrate more upright posture/position of head over chest/shoudlers allowing improved cervical positioning and improved movement quality thus decreasing pain and improving function 12/26/16   Time 6   Period  Weeks   Status On-going     PT LONG TERM GOAL #2   Title Increase cervical ROM in lateral flexion and rotation by 5-8 degrees 12/26/16   Time 6   Period Weeks   Status Achieved     PT LONG TERM GOAL #3   Title Patient to report decreased stiffness with movement and improved tolerance for sitting to 10-15 min with minimal to no increase in pain 12/26/16   Time 6   Period Weeks   Status Achieved     PT LONG TERM GOAL #4   Title Independent in HEP 12/26/16   Time 6   Period Weeks     PT LONG TERM GOAL #5   Title Improve FOTO to </= 33% limitation 12/26/16   Time 6   Period Weeks   Status On-going               Plan - 11/30/16 7078    Clinical Impression Statement Pt tolerated all exercises well, without increase in symptoms, only fatigue. Pt  declined modalities and manual therapy today.  His cervical ROM has improved; has met LTG # 2.  His sitting tolerance has improved to an hour per pt report; has met LTG #3.  Pt requests to put therapy on hold for 1 wk since he is feeling good.     Rehab Potential Good   PT Frequency 2x / week   PT Duration 6 weeks   PT Treatment/Interventions Patient/family education;ADLs/Self Care Home Management;Neuromuscular re-education;Cryotherapy;Electrical Stimulation;Iontophoresis 16m/ml Dexamethasone;Moist Heat;Ultrasound;Traction;Dry needling;Manual techniques;Taping   PT Next Visit Plan Will hold therapy for 1 wk per pt request.     Consulted and Agree with Plan of Care Patient      Patient will benefit from skilled therapeutic intervention in order to improve the following deficits and impairments:  Postural dysfunction, Improper body mechanics, Increased fascial restricitons, Increased muscle spasms, Decreased strength, Pain, Decreased range of motion, Decreased activity tolerance  Visit Diagnosis: Other symptoms and signs involving the musculoskeletal system  Abnormal posture     Problem List Patient Active Problem List   Diagnosis Date Noted  . Seborrheic keratoses 11/07/2016  . Acute medial meniscus tear of left knee 05/15/2016  . Internal hemorrhoids 04/12/2015  . 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 Essential hypertension 08/11/2013  . [place] 2 diabetes mellitus [place] 12/17/2012  . History of nephrolithiasis 12/17/2012  . Unspecified vitamin D deficiency 12/17/2012  . Excessive cerumen in ear canal 12/17/2012  . H/O degenerative disc disease 12/17/2012  . 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 Family history of colon cancer 12/17/2012  . Hyperlipidemia 12/12/2012   JKerin Perna PTA 11/30/16 9:31 AM  [facility] [address]Phone: [phone]  Fax:   [phone] Name: DKerrigan GlendeningMRN: [id number] of Birth: [birth year] PHYSICAL THERAPY DISCHARGE SUMMARY  Visits from Start of Care: 5  Current functional level related to goals / functional outcomes: Patient demonstrated good progress during the course of treatment. See progress note for details.   Remaining deficits: See notes   Education / Equipment: HEP Plan: Patient agrees to discharge.  Patient goals were partially met. Patient is being discharged due to being pleased with the current functional level.  ?????    Celyn P. HHelene KelpPT, MPH 12/14/16 10:15 AM

## 2016-12-11 ENCOUNTER — Telehealth: Payer: Self-pay | Admitting: Family Medicine

## 2016-12-11 MED ORDER — SIMVASTATIN 40 MG PO TABS: ORAL_TABLET | ORAL | 0 refills | Status: DC

## 2016-12-11 NOTE — Telephone Encounter (Signed)
Patient's daughter came in with a  Message from patient. Patient's prescription for simvastatin, 40 mg has no refills and patient does not understand why. Please advise! Thanks!

## 2016-12-11 NOTE — Telephone Encounter (Signed)
I refilled simvastatin. We should Check cholesterol and liver labs every 6 months or so on this medication for safety. I sent in a 90 day supply. I recommended a follow-up appointment at some point in the near future.

## 2016-12-12 NOTE — Telephone Encounter (Signed)
Information discussed with pt. Pt verbalized understanding. 

## 2017-02-24 ENCOUNTER — Other Ambulatory Visit: Payer: Self-pay | Admitting: Family Medicine

## 2017-03-04 ENCOUNTER — Encounter: Payer: Self-pay | Admitting: Family Medicine

## 2017-03-04 ENCOUNTER — Ambulatory Visit (INDEPENDENT_AMBULATORY_CARE_PROVIDER_SITE_OTHER): Payer: Medicare HMO | Admitting: Family Medicine

## 2017-03-04 VITALS — BP 129/82 | HR 63 | Temp 98.1°F | Wt 224.0 lb

## 2017-03-04 DIAGNOSIS — L089 Local infection of the skin and subcutaneous tissue, unspecified: Secondary | ICD-10-CM

## 2017-03-04 DIAGNOSIS — L723 Sebaceous cyst: Secondary | ICD-10-CM | POA: Diagnosis not present

## 2017-03-04 NOTE — Patient Instructions (Addendum)
Thank you for coming in today. Recheck as needed.  We should do a well check in September.  Return sooner if needed.    Epidermal Cyst An epidermal cyst is sometimes called an epidermal inclusion cyst or an infundibular cyst. It is a sac made of skin tissue. The sac contains a substance called keratin. Keratin is a protein that is normally secreted through the hair follicles. When keratin becomes trapped in the top layer of skin (epidermis), it can form an epidermal cyst. Epidermal cysts are usually found on the face, neck, trunk, and genitals. These cysts are usually harmless (benign), and they may not cause symptoms unless they become infected. It is important not to pop epidermal cysts yourself. What are the causes? This condition may be caused by:  A blocked hair follicle.  A hair that curls and re-enters the skin instead of growing straight out of the skin (ingrown hair).  A blocked pore.  Irritated skin.  An injury to the skin.  Certain conditions that are passed along from parent to child (inherited).  Human papillomavirus (HPV). What increases the risk? The following factors may make you more likely to develop an epidermal cyst:  Having acne.  Being overweight.  Wearing tight clothing. What are the signs or symptoms? The only symptom of this condition may be a small, painless lump underneath the skin. When an epidermal cyst becomes infected, symptoms may include:  Redness.  Inflammation.  Tenderness.  Warmth.  Fever.  Keratin draining from the cyst. Keratin may look like a grayish-white, bad-smelling substance.  Pus draining from the cyst. How is this diagnosed? This condition is diagnosed with a physical exam. In some cases, you may have a sample of tissue (biopsy) taken from your cyst to be examined under a microscope or tested for bacteria. You may be referred to a health care provider who specializes in skin care (dermatologist). How is this treated? In  many cases, epidermal cysts go away on their own without treatment. If a cyst becomes infected, treatment may include:  Opening and draining the cyst. After draining, minor surgery to remove the rest of the cyst may be done.  Antibiotic medicine to help prevent infection.  Injections of medicines (steroids) that help to reduce inflammation.  Surgery to remove the cyst. Surgery may be done if:  The cyst becomes large.  The cyst bothers you.  There is a chance that the cyst could turn into cancer. Follow these instructions at home:  Take over-the-counter and prescription medicines only as told by your health care provider.  If you were prescribed an antibiotic, use it as told by your health care provider. Do not stop using the antibiotic even if you start to feel better.  Keep the area around your cyst clean and dry.  Wear loose, dry clothing.  Do not try to pop your cyst.  Avoid touching your cyst.  Check your cyst every day for signs of infection.  Keep all follow-up visits as told by your health care provider. This is important. How is this prevented?  Wear clean, dry, clothing.  Avoid wearing tight clothing.  Keep your skin clean and dry. Shower or take baths every day.  Wash your body with a benzoyl peroxide wash when you shower or bathe. Contact a health care provider if:  Your cyst develops symptoms of infection.  Your condition is not improving or is getting worse.  You develop a cyst that looks different from other cysts you have had.  You have a fever. Get help right away if:  Redness spreads from the cyst into the surrounding area. This information is not intended to replace advice given to you by your health care provider. Make sure you discuss any questions you have with your health care provider. Document Released: 09/22/2004 Document Revised: 06/20/2016 Document Reviewed: 08/24/2015 Elsevier Interactive Patient Education  2017 [employer].

## 2017-03-04 NOTE — Progress Notes (Signed)
       Kevin Wood is a 71 y.o. male who presents to [place] today for skin lesion on his left groin. He noticed an ingrown hair about a week and a half ago in the same location that progressed into a erythematous, painful red lump and ruptured 2 days prior to today's visit.  It drained a grayish-white caseous substance and has been improving in pain since.  At today's visit, he denies any pain at the site of the lesion.  He denies fever, chills, recent travel or animal bites.    Past Medical History:  Diagnosis Date  . Elevated blood pressure 12/12/2012  . H/O degenerative disc disease 12/17/2012  . History of nephrolithiasis 12/17/2012  . Hyperlipidemia 12/12/2012  . Lumbar vertebral fracture ([place]) [place]  . Type 2 diabetes mellitus ([place]) 12/17/2012  . Unspecified vitamin D deficiency 12/17/2012   Outside Records    Past Surgical History:  Procedure Laterality Date  . c spine fustion  1994  . colon cancer  father  . GALLBLADDER SURGERY  2012   Social History  Substance Use Topics  . Smoking status: Never Smoker  . Smokeless tobacco: Never Used  . Alcohol use 0.5 oz/week    1 Standard drinks or equivalent per week   family history includes Cancer in his father.  ROS as above:  Medications: Current Outpatient Prescriptions  Medication Sig Dispense Refill  . lisinopril-hydrochlorothiazide (PRINZIDE,ZESTORETIC) 20-12.5 MG tablet Take 1 tablet by mouth daily. 90 tablet 3  . simvastatin (ZOCOR) 40 MG tablet TAKE 1 TABLET EVERY DAY 90 tablet 0   No current facility-administered medications for this visit.    No Known Allergies  Health Maintenance Health Maintenance  Topic Date Due  . INFLUENZA VACCINE  06/05/2017  . Fecal DNA (Cologuard)  01/01/2019  . TETANUS/TDAP  07/26/2019  . Hepatitis C Screening  Completed  . PNA vac Low Risk Adult   Completed     Exam:  BP 129/82   Pulse 63   Temp 98.1 F (36.7 C) (Oral)   Wt 224 lb (101.6 kg)   BMI 30.38 kg/m  Gen: Well NAD HEENT: EOMI,  MMM Lungs: Normal work of breathing. CTABL Heart: RRR no MRG Abd: NABS, Soft. Nondistended, Nontender Exts: Brisk capillary refill, warm and well perfused.  Skin:  Left groin: small, erythematous lesion with granulation tissue.  Not tender to palpitation.     No results found for this or any previous visit (from the past 72 hour(s)). No results found.    Assessment and Plan: 71 y.o. male with infected sebaceous cyst on his left groin The cyst has ruptured and drained and seems to be healing well.  Patient was instructed to keep the area clean.  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      Orders Placed This Encounter  Procedures  . Wound culture    Order Specific Question:   Source    Answer:   left skin groin   No orders of the defined types were placed in this encounter.    Discussed warning signs or symptoms. Please see discharge instructions. Patient expresses understanding.

## 2017-03-07 LAB — WOUND CULTURE
Gram Stain: NONE SEEN
Organism ID, Bacteria: NORMAL

## 2017-04-08 LAB — CBC AND DIFFERENTIAL: HEMOGLOBIN: 15.5 (ref 13.5–17.5)

## 2017-04-08 LAB — HEMOGLOBIN A1C: HEMOGLOBIN A1C: 6.9

## 2017-05-23 LAB — CBC AND DIFFERENTIAL
HEMOGLOBIN: 15.4 (ref 13.5–17.5)
PLATELETS: 137 — AB (ref 150–399)

## 2017-05-23 LAB — HEPATIC FUNCTION PANEL: AST: 19 (ref 14–40)

## 2017-05-23 LAB — BASIC METABOLIC PANEL
CHLORIDE: 102
Creatinine: 0.9 (ref 0.6–1.3)
Glucose: 150
POTASSIUM: 4.2 (ref 3.4–5.3)
SODIUM: 143 (ref 137–147)

## 2017-05-23 LAB — LIPID PANEL
CHOLESTEROL: 117 (ref 0–200)
HDL: 36 (ref 35–70)
LDL CALC: 68
Triglycerides: 65 (ref 40–160)

## 2017-05-23 LAB — HEMOGLOBIN A1C: Hemoglobin A1C: 6.6

## 2017-05-31 ENCOUNTER — Encounter: Payer: Self-pay | Admitting: Family Medicine

## 2017-05-31 LAB — INSULIN, RANDOM: INSULIN: 38.8

## 2017-05-31 LAB — CHG C-REACTIVE PROTEIN: CRP: 1.7

## 2017-05-31 LAB — DHEA-SULFATE: DHEA-SULFATE, SERUM: 113.8

## 2017-05-31 LAB — FIBRINOGEN: FIBRINOGEN: 383

## 2017-05-31 LAB — CORTISOL - AM: CORTISOL - AM: 5.9

## 2017-06-20 ENCOUNTER — Other Ambulatory Visit: Payer: Self-pay | Admitting: Family Medicine

## 2017-07-16 ENCOUNTER — Encounter: Payer: Self-pay | Admitting: Family Medicine

## 2017-07-16 ENCOUNTER — Ambulatory Visit (INDEPENDENT_AMBULATORY_CARE_PROVIDER_SITE_OTHER): Payer: Medicare HMO | Admitting: Family Medicine

## 2017-07-16 VITALS — BP 114/64 | HR 64 | Wt 220.0 lb

## 2017-07-16 DIAGNOSIS — E782 Mixed hyperlipidemia: Secondary | ICD-10-CM | POA: Diagnosis not present

## 2017-07-16 DIAGNOSIS — Z23 Encounter for immunization: Secondary | ICD-10-CM | POA: Diagnosis not present

## 2017-07-16 DIAGNOSIS — R7303 Prediabetes: Secondary | ICD-10-CM | POA: Diagnosis not present

## 2017-07-16 DIAGNOSIS — E559 Vitamin D deficiency, unspecified: Secondary | ICD-10-CM | POA: Diagnosis not present

## 2017-07-16 DIAGNOSIS — Z125 Encounter for screening for malignant neoplasm of prostate: Secondary | ICD-10-CM

## 2017-07-16 DIAGNOSIS — Z0000 Encounter for general adult medical examination without abnormal findings: Secondary | ICD-10-CM | POA: Diagnosis not present

## 2017-07-16 DIAGNOSIS — I10 Essential (primary) hypertension: Secondary | ICD-10-CM | POA: Diagnosis not present

## 2017-07-16 MED ORDER — ZOSTER VAC RECOMB ADJUVANTED 50 MCG/0.5ML IM SUSR: 0.5000 mL | Freq: Once | INTRAMUSCULAR | 1 refills | Status: AC

## 2017-07-16 NOTE — Progress Notes (Signed)
HPI: Kevin Wood is a 71 y.o. male  who presents to [place]  today, 07/16/17,  for Medicare Annual Wellness Exam  Patient presents for annual physical/Medicare wellness exam. no complaints today.   Past medical, surgical, social and family history reviewed:  Patient Active Problem List   Diagnosis Date Noted  . Seborrheic keratoses 11/07/2016  . Acute medial meniscus tear of left knee 05/15/2016  . Internal hemorrhoids 04/12/2015  . 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 Essential hypertension 08/11/2013  . Prediabetes 12/17/2012  . History of nephrolithiasis 12/17/2012  . Unspecified vitamin D deficiency 12/17/2012  . Excessive cerumen in ear canal 12/17/2012  . H/O degenerative disc disease 12/17/2012  . 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 Family history of colon cancer 12/17/2012  . Hyperlipidemia 12/12/2012    Past Surgical History:  Procedure Laterality Date  . c spine fustion  1994  . colon cancer  father  . GALLBLADDER SURGERY  2012    Social History   Social History  . Marital status: Married    Spouse name: N/A  . Number of children: N/A  . Years of education: N/A   Occupational History  . Not on file.   Social History Main Topics  . Smoking status: Never Smoker  . Smokeless tobacco: Never Used  . Alcohol use 0.5 oz/week    1 Standard drinks or equivalent per week  . Drug use: No  . Sexual activity: Yes   Other Topics Concern  . Not on file   Social History Narrative  . No narrative on file    Family History  Problem Relation Age of Onset  . Cancer Father        colon cancer     Current medication list and allergy/intolerance information reviewed:    Outpatient Encounter Prescriptions as of 07/16/2017  Medication Sig  . lisinopril-hydrochlorothiazide (PRINZIDE,ZESTORETIC) 20-12.5 MG tablet Take 1 tablet by mouth daily.  . simvastatin (ZOCOR) 40 MG tablet TAKE 1 TABLET EVERY  DAY   No facility-administered encounter medications on file as of 07/16/2017.     No Known Allergies     Review of Systems: No headache, visual changes, nausea, vomiting, diarrhea, constipation, dizziness, abdominal pain, skin rash, fevers, chills, night sweats, weight loss, swollen lymph nodes, body aches, joint swelling, muscle aches, chest pain, shortness of breath, mood changes, visual or auditory hallucinations.     Medicare Wellness Questionnaire  Are there smokers in your home (other than you)? no  Depression screen [facility] 2/9 07/12/2016 03/03/2015 01/25/2014  Decreased Interest 0 0 0  Down, Depressed, Hopeless 0 0 0  PHQ - 2 Score 0 0 0        Activities of Daily Living In your present state of health, do you have any difficulty performing the following activities?:  Driving? no Managing money?  no Feeding yourself? no Getting from bed to chair? no Climbing a flight of stairs? no Preparing food and eating?: no Bathing or showering? no Getting dressed: no Getting to the toilet? no Using the toilet: no Moving around from place to place: no In the past year have you fallen or had a near fall?: no  Hearing Difficulties:  Do you often ask people to speak up or repeat themselves? no Do you experience ringing or noises in your ears? yes  Do you have difficulty understanding soft or whispered voices? no  Memory Difficulties:  Do you feel that you have a problem with memory? no  Do you often misplace items? no  Do you feel safe at home?  yes  Sexual Health:   Are you sexually active?  Yes  Do you have more than one partner?  No   Risk Factors  Current exercise habits: Walking daily  Dietary issues discussed:Yes  Cardiac risk factors: present   Exam:  BP 114/64   Pulse 64   Wt 220 lb (99.8 kg)   BMI 29.84 kg/m   Wt Readings from Last 5 Encounters:  07/16/17 220 lb (99.8 kg)  03/04/17 224 lb (101.6 kg)  11/07/16 228 lb (103.4 kg)  07/12/16 226 lb (102.5  kg)  06/05/16 223 lb (101.2 kg)    Vision by Snellen chart: right eye:see nurse notes, left eye:see nurse notes  Constitutional: VS see above. General Appearance: alert, well-developed, well-nourished, NAD  Ears, Nose, Mouth, Throat: MMM  Neck: No masses, trachea midline.   Respiratory: Normal respiratory effort. no wheeze, no rhonchi, no rales  Cardiovascular:No lower extremity edema.   Musculoskeletal: Gait normal. No clubbing/cyanosis of digits.   Neurological: Normal balance/coordination. No tremor. Recalls 3 objects and able to read face of watch with correct time.   Skin: warm, dry, intact. No rash/ulcer.   Psychiatric: Normal judgment/insight. Normal mood and affect. Oriented x3.     ASSESSMENT/PLAN:   Encounter for Medicare annual wellness exam  Encounter for immunization - Plan: Flu vaccine HIGH DOSE PF  Health Maintenance Health Maintenance  Topic Date Due  . INFLUENZA VACCINE  06/05/2017  . Fecal DNA (Cologuard)  01/01/2019  . TETANUS/TDAP  07/26/2019  . Hepatitis C Screening  Completed  . PNA vac Low Risk Adult  Completed    Immunization History  Administered Date(s) Administered  . Influenza, High Dose Seasonal PF 07/12/2016, 07/16/2017  . Influenza,inj,Quad PF,6+ Mos 07/02/2013  . Influenza-Unspecified 10/11/2011, 08/20/2015  . PPD Test 12/01/2014  . Pneumococcal Conjugate-13 03/03/2015  . Pneumococcal Polysaccharide-23 06/05/2016  . Tdap 07/25/2009  . Zoster 08/24/2013   Shingrix prescribed.   Orders Placed This Encounter  Procedures  . Flu vaccine HIGH DOSE PF  . CBC  . COMPLETE METABOLIC PANEL WITH GFR  . Lipid Panel w/reflex Direct LDL  . VITAMIN D 25 Hydroxy (Vit-D Deficiency, Fractures)  . Hemoglobin A1c  . PSA     During the course of the visit the patient was educated and counseled about appropriate screening and preventive services as noted above.   Patient Instructions (the written plan) was given to the patient.  Medicare  Attestation I have personally reviewed: The patient's medical and social history Their use of alcohol, tobacco or illicit drugs Their current medications and supplements The patient's functional ability including ADLs,fall risks, home safety risks, cognitive, and hearing and visual impairment Diet and physical activities Evidence for depression or mood disorders  The patient's weight, height, BMI, and visual acuity have been recorded in the chart.  I have made referrals, counseling, and provided education to the patient based on review of the above and I have provided the patient with a written personalized care plan for preventive services.

## 2017-07-16 NOTE — Patient Instructions (Signed)
Thank you for coming in today. Get labs today.  Recheck in 1 year or sooner if needed.  You should get the Shigrix vaccine at the pharmacy.    I will get results to you ASAP.

## 2017-07-17 ENCOUNTER — Encounter: Payer: Self-pay | Admitting: Family Medicine

## 2017-07-17 LAB — COMPLETE METABOLIC PANEL WITH GFR
AG RATIO: 1.8 (calc) (ref 1.0–2.5)
ALT: 23 U/L (ref 9–46)
AST: 21 U/L (ref 10–35)
Albumin: 4.3 g/dL (ref 3.6–5.1)
Alkaline phosphatase (APISO): 67 U/L (ref 40–115)
BILIRUBIN TOTAL: 0.6 mg/dL (ref 0.2–1.2)
BUN: 18 mg/dL (ref 7–25)
CHLORIDE: 102 mmol/L (ref 98–110)
CO2: 30 mmol/L (ref 20–32)
Calcium: 9.3 mg/dL (ref 8.6–10.3)
Creat: 0.94 mg/dL (ref 0.70–1.25)
GFR, EST AFRICAN AMERICAN: 95 mL/min/{1.73_m2} (ref >=60)
GFR, Est Non African American: 82 mL/min/{1.73_m2} (ref >=60)
Globulin: 2.4 g/dL (calc) (ref 1.9–3.7)
Glucose, Bld: 129 mg/dL — ABNORMAL HIGH (ref 65–99)
POTASSIUM: 4.2 mmol/L (ref 3.5–5.3)
Sodium: 140 mmol/L (ref 135–146)
Total Protein: 6.7 g/dL (ref 6.1–8.1)

## 2017-07-17 LAB — CBC
HCT: 46.3 % (ref 38.5–50.0)
Hemoglobin: 16 g/dL (ref 13.2–17.1)
MCH: 29.5 pg (ref 27.0–33.0)
MCHC: 34.6 g/dL (ref 32.0–36.0)
MCV: 85.3 fL (ref 80.0–100.0)
MPV: 10.9 fL (ref 7.5–12.5)
PLATELETS: 144 10*3/uL (ref 140–400)
RBC: 5.43 10*6/uL (ref 4.20–5.80)
RDW: 13.2 % (ref 11.0–15.0)
WBC: 4.2 10*3/uL (ref 3.8–10.8)

## 2017-07-17 LAB — LIPID PANEL W/REFLEX DIRECT LDL
Cholesterol: 124 mg/dL (ref <200)
HDL: 38 mg/dL — AB (ref >40)
LDL Cholesterol (Calc): 68 mg/dL (calc)
Non-HDL Cholesterol (Calc): 86 mg/dL (calc) (ref <130)
TRIGLYCERIDES: 95 mg/dL (ref <150)
Total CHOL/HDL Ratio: 3.3 (calc) (ref <5.0)

## 2017-07-17 LAB — HEMOGLOBIN A1C
EAG (MMOL/L): 8.4 (calc)
Hgb A1c MFr Bld: 6.9 % of total Hgb — ABNORMAL HIGH (ref <5.7)
Mean Plasma Glucose: 151 (calc)

## 2017-07-17 LAB — VITAMIN D 25 HYDROXY (VIT D DEFICIENCY, FRACTURES): Vit D, 25-Hydroxy: 57 ng/mL (ref 30–100)

## 2017-07-17 LAB — PSA: PSA: 0.4 ng/mL (ref <=4.0)

## 2017-08-12 ENCOUNTER — Encounter: Payer: Self-pay | Admitting: Family Medicine

## 2017-08-12 ENCOUNTER — Ambulatory Visit (INDEPENDENT_AMBULATORY_CARE_PROVIDER_SITE_OTHER): Payer: Medicare HMO | Admitting: Family Medicine

## 2017-08-12 VITALS — BP 105/71 | HR 68 | Temp 98.3°F | Ht 70.0 in | Wt 218.0 lb

## 2017-08-12 DIAGNOSIS — J40 Bronchitis, not specified as acute or chronic: Secondary | ICD-10-CM

## 2017-08-12 MED ORDER — BENZONATATE 200 MG PO CAPS: 200.0000 mg | ORAL_CAPSULE | Freq: Three times a day (TID) | ORAL | 0 refills | Status: DC | PRN

## 2017-08-12 MED ORDER — GUAIFENESIN-CODEINE 100-10 MG/5ML PO SOLN: 5.0000 mL | Freq: Every evening | ORAL | 0 refills | Status: DC | PRN

## 2017-08-12 NOTE — Patient Instructions (Addendum)
Thank you for coming in today. Take the tessalon for cough during the day.  Use the codeine at bedtime.   For diabetes try to eat less than 60g of carbs per meal.    I will see you in late December.   Recheck sooner if needed.

## 2017-08-13 NOTE — Progress Notes (Signed)
Kevin Wood is a 71 y.o. male who presents to [facility]: [place] today for cough congestion runny nose. Symptoms present for 4 days. Cough is mildly productive but bothersome at nighttime. He's tried some over-the-counter medications which helped a bit. He denies significant shortness of breath fevers or chills. No vomiting diarrhea chest pain or palpitations.   Past Medical History:  Diagnosis Date  . Elevated blood pressure 12/12/2012  . H/O degenerative disc disease 12/17/2012  . History of nephrolithiasis 12/17/2012  . Hyperlipidemia 12/12/2012  . Lumbar vertebral fracture ([place]) [place]  . Type 2 diabetes mellitus ([place]) 12/17/2012  . Unspecified vitamin D deficiency 12/17/2012   Outside Records    Past Surgical History:  Procedure Laterality Date  . c spine fustion  1994  . colon cancer  father  . GALLBLADDER SURGERY  2012   Social History  Substance Use Topics  . Smoking status: Never Smoker  . Smokeless tobacco: Never Used  . Alcohol use 0.5 oz/week    1 Standard drinks or equivalent per week   family history includes Cancer in his father.  ROS as above:  Medications: Current Outpatient Prescriptions  Medication Sig Dispense Refill  . lisinopril-hydrochlorothiazide (PRINZIDE,ZESTORETIC) 20-12.5 MG tablet Take 1 tablet by mouth daily. 90 tablet 3  . simvastatin (ZOCOR) 40 MG tablet TAKE 1 TABLET EVERY DAY 90 tablet 0  . benzonatate (TESSALON) 200 MG capsule Take 1 capsule (200 mg total) by mouth 3 (three) times daily as needed for cough. 45 capsule 0  . guaiFENesin-codeine 100-10 MG/5ML syrup Take 5 mLs by mouth at bedtime as needed for cough. 120 mL 0   No current facility-administered medications for this visit.    No Known Allergies  Health Maintenance Health Maintenance  Topic Date Due  . OPHTHALMOLOGY EXAM  06/05/2017  . HEMOGLOBIN A1C   01/13/2018  . FOOT EXAM  08/12/2018  . Fecal DNA (Cologuard)  01/01/2019  . TETANUS/TDAP  07/26/2019  . INFLUENZA VACCINE  Completed  . Hepatitis C Screening  Completed  . PNA vac Low Risk Adult  Completed     Exam:  BP 105/71   Pulse 68   Temp 98.3 F (36.8 C) (Oral)   Ht 5\' 10"  (1.778 m)   Wt 218 lb (98.9 kg)   BMI 31.28 kg/m  Gen: Well NAD HEENT: EOMI,  MMM posterior pharynx with cobblestoning clear nasal discharge. Lungs: Normal work of breathing. CTABL Heart: RRR no MRG Abd: NABS, Soft. Nondistended, Nontender Exts: Brisk capillary refill, warm and well perfused.    No results found for this or any previous visit (from the past 72 hour(s)). No results found.    Assessment and Plan: 71 y.o. male with cough likely due to viral bronchitis. Discussed treatment options. Plan for trial of prescription Tessalon Perles and codeine-based cough syrup. We discussed back up and treatment options as well including prednisone and antibiotics if not better. We'll hold off on further diagnostic methods or more aggressive treatment options at this time.   No orders of the defined types were placed in this encounter.  Meds ordered this encounter  Medications  . benzonatate (TESSALON) 200 MG capsule    Sig: Take 1 capsule (200 mg total) by mouth 3 (three) times daily as needed for cough.    Dispense:  45 capsule    Refill:  0  . guaiFENesin-codeine 100-10 MG/5ML syrup    Sig: Take 5 mLs  by mouth at bedtime as needed for cough.    Dispense:  120 mL    Refill:  0     Discussed warning signs or symptoms. Please see discharge instructions. Patient expresses understanding.

## 2017-08-27 LAB — HM DIABETES EYE EXAM

## 2017-10-11 LAB — BASIC METABOLIC PANEL
BUN: 14 (ref 4–21)
Creatinine: 1 (ref 0.6–1.3)
Glucose: 134
Potassium: 3.7 (ref 3.4–5.3)
Sodium: 138 (ref 137–147)

## 2017-10-11 LAB — VITAMIN D 25 HYDROXY (VIT D DEFICIENCY, FRACTURES): VIT D 25 HYDROXY: 45.45

## 2017-10-26 ENCOUNTER — Other Ambulatory Visit: Payer: Self-pay | Admitting: Family Medicine

## 2017-11-13 ENCOUNTER — Ambulatory Visit (INDEPENDENT_AMBULATORY_CARE_PROVIDER_SITE_OTHER): Payer: Medicare HMO | Admitting: Family Medicine

## 2017-11-13 ENCOUNTER — Encounter: Payer: Self-pay | Admitting: Family Medicine

## 2017-11-13 ENCOUNTER — Ambulatory Visit (INDEPENDENT_AMBULATORY_CARE_PROVIDER_SITE_OTHER): Payer: Medicare HMO

## 2017-11-13 VITALS — BP 108/70 | HR 86 | Wt 224.0 lb

## 2017-11-13 DIAGNOSIS — M4856XA Collapsed vertebra, not elsewhere classified, lumbar region, initial encounter for fracture: Secondary | ICD-10-CM

## 2017-11-13 DIAGNOSIS — M542 Cervicalgia: Secondary | ICD-10-CM

## 2017-11-13 DIAGNOSIS — M8588 Other specified disorders of bone density and structure, other site: Secondary | ICD-10-CM | POA: Diagnosis not present

## 2017-11-13 DIAGNOSIS — M545 Low back pain: Secondary | ICD-10-CM | POA: Diagnosis not present

## 2017-11-13 DIAGNOSIS — M4306 Spondylolysis, lumbar region: Secondary | ICD-10-CM

## 2017-11-13 MED ORDER — METHOCARBAMOL 500 MG PO TABS: 500.0000 mg | ORAL_TABLET | Freq: Three times a day (TID) | ORAL | 0 refills | Status: DC | PRN

## 2017-11-13 NOTE — Progress Notes (Signed)
Kevin Wood is a 72 y.o. male who presents to [facility] today for chronic neck and back pain.  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  This causes chronic pain which is typically pretty well managed.  However he notes his pain has worsened recently especially in his neck.  This is been ongoing for a few weeks but does not associated with radiating pain weakness or numbness.  He the pain to the right cervical region and right trapezius area.  He is tried some over-the-counter medicines which have helped a bit.  He found an old prescription for methocarbamol which he takes intermittently and notes this has been pretty helpful and would like a refill if possible.   Past Medical History:  Diagnosis Date  . Elevated blood pressure 12/12/2012  . H/O degenerative disc disease 12/17/2012  . History of nephrolithiasis 12/17/2012  . Hyperlipidemia 12/12/2012  . Lumbar vertebral fracture ([place]) [place]  . Type 2 diabetes mellitus ([place]) 12/17/2012  . Unspecified vitamin D deficiency 12/17/2012   Outside Records    Past Surgical History:  Procedure Laterality Date  . c spine fustion  1994  . colon cancer  father  . GALLBLADDER SURGERY  2012   Social History   Tobacco Use  . Smoking status: Never Smoker  . Smokeless tobacco: Never Used  Substance Use Topics  . Alcohol use: Yes    Alcohol/week: 0.5 oz    Types: 1 Standard drinks or equivalent per week     ROS:  As above   Medications: Current Outpatient Medications  Medication Sig Dispense Refill  . lisinopril-hydrochlorothiazide (PRINZIDE,ZESTORETIC) 20-12.5 MG tablet Take 1 tablet by mouth daily. 90 tablet 3  . simvastatin (ZOCOR) 40 MG tablet TAKE 1 TABLET EVERY DAY 90 tablet 0  . methocarbamol (ROBAXIN) 500 MG tablet Take 1 tablet (500 mg total) by mouth every 8 (eight) hours as needed for muscle spasms. 90 tablet 0   No current  facility-administered medications for this visit.    No Known Allergies   Exam:  BP 108/70   Pulse 86   Wt 224 lb (101.6 kg)   BMI 32.14 kg/m  General: Well Developed, well nourished, and in no acute distress.  Neuro/Psych: Alert and oriented x3, extra-ocular muscles intact, able to move all 4 extremities, sensation grossly intact. Skin: Warm and dry, no rashes noted.  Respiratory: Not using accessory muscles, speaking in full sentences, trachea midline.  Cardiovascular: Pulses palpable, no extremity edema. Abdomen: Does not appear distended. MSK:  Spine nontender to spinal midline.  Tender to palpation right cervical paraspinal muscle group. Decreased range of motion. Negative Spurling's test. Upper extremity strength is intact.  Spine: Nontender normal motion normal gait.  X-ray cervical and lumbar spine pending  No results found for this or any previous visit (from the past 48 hour(s)). No results found.    Assessment and Plan: 72 y.o. male with chronic neck and low back pain.  This is very likely due to DDD.  The discussion of this is difficult to completely control but it is improvable with some lifestyle modification.  He seems pretty satisfied with this however the acute neck pain is different.  I believe this to be myofascial strain or disruption and I think you would do very well with physical therapy.  Plan to check an x-ray of his cervical and lumbar spine refer to physical therapy and prescribed methocarbamol.  Recheck  if not improving.    Orders Placed This Encounter  Procedures  . DG Cervical Spine Complete    Standing Status:   Future    Number of Occurrences:   1    Standing Expiration Date:   01/12/2019    Order Specific Question:   Reason for Exam (SYMPTOM  OR DIAGNOSIS REQUIRED)    Answer:   eval pain cspine    Order Specific Question:   Preferred imaging location?    Answer:   [facility]    Order Specific Question:   Radiology Contrast  Protocol - do NOT remove file path    Answer:   [email or web address]  . DG Lumbar Spine Complete    Standing Status:   Future    Number of Occurrences:   1    Standing Expiration Date:   01/12/2019    Order Specific Question:   Reason for Exam (SYMPTOM  OR DIAGNOSIS REQUIRED)    Answer:   eval chronic lspine    Order Specific Question:   Preferred imaging location?    Answer:   [facility]    Order Specific Question:   Radiology Contrast Protocol - do NOT remove file path    Answer:   [email or web address]  . Ambulatory referral to Physical Therapy    Referral Priority:   Routine    Referral Type:   Physical Medicine    Referral Reason:   Specialty Services Required    Requested Specialty:   Physical Therapy   Meds ordered this encounter  Medications  . methocarbamol (ROBAXIN) 500 MG tablet    Sig: Take 1 tablet (500 mg total) by mouth every 8 (eight) hours as needed for muscle spasms.    Dispense:  90 tablet    Refill:  0    Discussed warning signs or symptoms. Please see discharge instructions. Patient expresses understanding.  I spent 25 minutes with this patient, greater than 50% was face-to-face time counseling regarding ddx and treatment plan.

## 2017-11-13 NOTE — Patient Instructions (Addendum)
Thank you for coming in today. Get xray today.  Attend PT.  Return sooner if needed.

## 2017-11-14 ENCOUNTER — Telehealth: Payer: Self-pay | Admitting: Family Medicine

## 2017-11-14 ENCOUNTER — Encounter: Payer: Self-pay | Admitting: Family Medicine

## 2017-11-14 DIAGNOSIS — I739 Peripheral vascular disease, unspecified: Principal | ICD-10-CM

## 2017-11-14 DIAGNOSIS — I779 Disorder of arteries and arterioles, unspecified: Secondary | ICD-10-CM

## 2017-11-14 NOTE — Telephone Encounter (Signed)
Carotid ultrasound ordered to follow-up carotid artery disease seen on cervical spine x-ray.

## 2017-11-15 ENCOUNTER — Ambulatory Visit (HOSPITAL_BASED_OUTPATIENT_CLINIC_OR_DEPARTMENT_OTHER)
Admission: RE | Admit: 2017-11-15 | Discharge: 2017-11-15 | Disposition: A | Discharge status: Home / Self Care | Payer: Medicare HMO | Source: Ambulatory Visit | Attending: Family Medicine | Admitting: Family Medicine

## 2017-11-15 DIAGNOSIS — I6523 Occlusion and stenosis of bilateral carotid arteries: Secondary | ICD-10-CM | POA: Diagnosis not present

## 2017-11-15 DIAGNOSIS — I779 Disorder of arteries and arterioles, unspecified: Secondary | ICD-10-CM

## 2017-11-15 DIAGNOSIS — I739 Peripheral vascular disease, unspecified: Secondary | ICD-10-CM

## 2017-11-16 ENCOUNTER — Encounter: Payer: Self-pay | Admitting: Family Medicine

## 2017-11-16 ENCOUNTER — Telehealth: Payer: Self-pay | Admitting: Family Medicine

## 2017-11-16 DIAGNOSIS — E041 Nontoxic single thyroid nodule: Secondary | ICD-10-CM

## 2017-11-16 NOTE — Telephone Encounter (Signed)
Thyroid nodule follow up US ordered.

## 2017-11-18 ENCOUNTER — Other Ambulatory Visit: Payer: Self-pay | Admitting: Family Medicine

## 2017-11-18 DIAGNOSIS — E041 Nontoxic single thyroid nodule: Secondary | ICD-10-CM

## 2017-11-21 ENCOUNTER — Other Ambulatory Visit: Payer: Medicare HMO

## 2017-11-21 ENCOUNTER — Ambulatory Visit (INDEPENDENT_AMBULATORY_CARE_PROVIDER_SITE_OTHER): Payer: Medicare HMO

## 2017-11-21 DIAGNOSIS — E041 Nontoxic single thyroid nodule: Secondary | ICD-10-CM | POA: Diagnosis not present

## 2017-11-22 ENCOUNTER — Telehealth: Payer: Self-pay | Admitting: Family Medicine

## 2017-11-22 DIAGNOSIS — E041 Nontoxic single thyroid nodule: Secondary | ICD-10-CM

## 2017-11-22 NOTE — Telephone Encounter (Signed)
US thyroid biopsy ordered

## 2017-11-26 ENCOUNTER — Encounter: Payer: Self-pay | Admitting: Rehabilitative and Restorative Service Providers"

## 2017-11-26 ENCOUNTER — Ambulatory Visit (INDEPENDENT_AMBULATORY_CARE_PROVIDER_SITE_OTHER): Payer: Medicare HMO | Admitting: Rehabilitative and Restorative Service Providers"

## 2017-11-26 DIAGNOSIS — M542 Cervicalgia: Secondary | ICD-10-CM

## 2017-11-26 DIAGNOSIS — R293 Abnormal posture: Secondary | ICD-10-CM

## 2017-11-26 DIAGNOSIS — R29898 Other symptoms and signs involving the musculoskeletal system: Secondary | ICD-10-CM

## 2017-11-26 NOTE — Therapy (Signed)
[place], [place], [address] Phone: [phone]   Fax:  [phone]  Physical Therapy Evaluation  Patient Details  Name: Kevin Wood MRN: [id number] Date of Birth: [birth year] Referring Provider: Dr Lynne Leader    Encounter Date: 11/26/2017  PT End of Session - 11/26/17 0855    Visit Number  1    Number of Visits  12    Date for PT Re-Evaluation  01/07/18    PT Start Time  0845    PT Stop Time  0947    PT Time Calculation (min)  62 min    Activity Tolerance  Patient tolerated treatment well       Past Medical History:  Diagnosis Date  . Elevated blood pressure 12/12/2012  . H/O degenerative disc disease 12/17/2012  . History of nephrolithiasis 12/17/2012  . Hyperlipidemia 12/12/2012  . Lumbar vertebral fracture ([place]) Index  . Type 2 diabetes mellitus ([place]) 12/17/2012  . Unspecified vitamin D deficiency 12/17/2012   Outside Records     Past Surgical History:  Procedure Laterality Date  . c spine fustion  1994  . colon cancer  father  . GALLBLADDER SURGERY  2012    There were no vitals filed for this visit.   Subjective Assessment - 11/26/17 0855    Subjective  Kevin Wood reports that he has pain in the back of the neck which has been there for the past 20 years with cervical disc surgery ~ 20 years ago - didn't help much. He has noticed increased pain and stiffness in the neck in the past three weeks with no known cause. He started some meds today but does not know if he has any change due to meds.     Pertinent History  Neck pain x 20 yrs; cervical disc surgery ~ 20 yrs ago; LBP x 40 years related to accident in the [place]; HTN     How long can you sit comfortably?  20 min     How long can you stand comfortably?  no limit    How long can you walk comfortably?  no limit    Diagnostic tests  xrays     Patient Stated Goals  relieve some of the pain in his neck     Currently in  Pain?  Yes    Pain Score  5     Pain Location  Neck    Pain Orientation  Mid;Lower    Pain Descriptors / Indicators  Nagging    Pain Type  Chronic pain    Pain Radiating Towards  up into posterior head - HA     Pain Onset  More than a month ago    Pain Frequency  Intermittent    Aggravating Factors   lying down    Pain Relieving Factors  walking; moving          [facility] PT Assessment - 11/26/17 0001      Assessment   Medical Diagnosis  Cervical dysfunction; LBP     Referring Provider  Dr Lynne Leader     Onset Date/Surgical Date  11/05/17 neck pain for ~ 20 years     Hand Dominance  Left    Next MD Visit  PRN     Prior Therapy  for back not neck       Precautions   Precautions  None      Balance Screen   Has the patient fallen in  the past 6 months  No    Has the patient had a decrease in activity level because of a fear of falling?   No    Is the patient reluctant to leave their home because of a fear of falling?   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, pushing wheelchairs     Leisure  household chores; cooking; motorcycles; yard work; gym 3x/wk treadmill x 35 min and machines       Observation/Other Assessments   Focus on Therapeutic Outcomes (FOTO)   33% limitation       Sensation   Additional Comments  WFL's per pt report       Posture/Postural Control   Posture Comments  head forward; shoulders rounded and eelvated; head of the humerus anterior in orientation; scapulae abducted and rotated along the thoracic wall      AROM   Right/Left Shoulder  -- stiff and limited end ranges bilat shds     Cervical Flexion  47    Cervical Extension  18    Cervical - Right Side Bend  17    Cervical - Left Side Bend  13    Cervical - Right Rotation  50    Cervical - Left Rotation  44      Strength   Overall Strength Comments  5/5 bilat except lower trap  5-/5 bilat       Palpation   Spinal mobility  poor spinal mobilty through cervical and thoracic spine throughout     Palpation comment  significant muscular tightness through upper traps; leveator; ant/lat/post cervical musculature; pecs             Objective measurements completed on examination: See above findings.      [facility] Adult PT Treatment/Exercise - 11/26/17 0001      Neuro Re-ed    Neuro Re-ed Details   working on posture and alignment encouraging improved axial extension; engaging posterior shcouler girdle musculature       Shoulder Exercises: Standing   Other Standing Exercises  axial extension 10 sec x 5; scap squeeze 10 sec x 10; L's x 10; W's x 10 with swim noodle       Shoulder Exercises: Stretch   Other Shoulder Stretches  3 way doorway stretch 30 sec x 3 each position       Moist Heat Therapy   Number Minutes Moist Heat  20 Minutes    Moist Heat Location  Cervical;Shoulder      Electrical Stimulation   Electrical Stimulation Location  bilat cervical paraspinals     Electrical Stimulation Action  IFC    Electrical Stimulation Parameters  to tolerance    Electrical Stimulation Goals  Pain;Tone             PT Education - 11/26/17 [phone]    Education provided  Yes    Education Details  posture and alignment; HEP; DN     Person(s) Educated  Patient    Methods  Explanation;Demonstration;Tactile cues;Verbal cues;Handout    Comprehension  Verbalized understanding;Returned demonstration;Verbal cues required;Tactile cues required          PT Long Term Goals - 11/26/17 1308      PT LONG TERM GOAL #1   Title  Improve posture and alignment with patient to demonstrate more upright posture/position of head over chest/shoudlers  allowing improved cervical positioning and improved movement quality thus decreasing pain and improving function 01/07/18    Time  6    Period  Weeks    Status  New      PT LONG TERM GOAL #2   Title  Increase cervical ROM in  lateral flexion and rotation by 5-8 degrees 01/07/18    Time  6    Period  Weeks    Status  New      PT LONG TERM GOAL #3   Title  Patient to report decreased stiffness with movement and improved tolerance for sitting to 10-15 min with minimal to no increase in pain 01/07/18    Time  6    Period  Weeks    Status  New      PT LONG TERM GOAL #4   Title  Independent in HEP with good program to improve mobility 01/07/18    Time  6    Period  Weeks    Status  New      PT LONG TERM GOAL #5   Title  Improve FOTO to </= 33% limitation 01/07/18    Time  6    Period  Weeks    Status  New             Plan - 11/26/17 1255    Clinical Impression Statement  Aedan presents with increased cervical pain and headaches over the past three weeks. He has a history of cervical pain and dysfunction for over 20 years with cervical disc surgery ~ 20 years ago. Patient has a history of LBP as well. Dashun presents with poor cervical and thoracic posture; limited cervical and thoracic mobility and ROM; significant muscular tightness through ant/lat/post cervical musculature, pecs, upper trap, leveator. He has pain on a daily basis that interfers with functional activity level. Patient will benefit from PT to address problems identified.     Clinical Presentation  Stable    Clinical Decision Making  Low    Rehab Potential  Good    PT Frequency  2x / week    PT Duration  6 weeks    PT Treatment/Interventions  Patient/family education;ADLs/Self Care Home Management;Cryotherapy;Electrical Stimulation;Iontophoresis 4mg /ml Dexamethasone;Moist Heat;Ultrasound;Dry needling;Manual techniques;Neuromuscular re-education;Traction;Therapeutic activities;Therapeutic exercise    PT Next Visit Plan  review HEP; continue with postural correction; cervical and thoracic mobs; pulley shoulder ROM; snow angel; deep tissue work vs DN cervical/shoulder girdle musculature; modalities as indicated     Consulted and Agree with Plan of  Care  Patient       Patient will benefit from skilled therapeutic intervention in order to improve the following deficits and impairments:  Postural dysfunction, Improper body mechanics, Pain, Increased fascial restricitons, Increased muscle spasms, Decreased mobility, Decreased range of motion, Decreased strength, Decreased activity tolerance  Visit Diagnosis: Cervicalgia - Plan: PT plan of care cert/re-cert  Other symptoms and signs involving the musculoskeletal system - Plan: PT plan of care cert/re-cert  Abnormal posture - Plan: PT plan of care cert/re-cert     Problem List Patient Active Problem List   Diagnosis Date Noted  . Thyroid nodule 11/16/2017  . Carotid arterial disease ([place]) 11/14/2017  . Seborrheic keratoses 11/07/2016  . Acute medial meniscus tear of left knee 05/15/2016  . Internal hemorrhoids 04/12/2015  . 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 Essential hypertension 08/11/2013  . Controlled type 2 diabetes mellitus without complication, without long-term current use of insulin ([place]) 12/17/2012  . History of nephrolithiasis 12/17/2012  .  Vitamin D deficiency 12/17/2012  . Excessive cerumen in ear canal 12/17/2012  . H/O degenerative disc disease 12/17/2012  . 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 Family history of colon cancer 12/17/2012  . Hyperlipidemia 12/12/2012    Lanny Donoso Nilda Simmer PT, MPH  11/26/2017, 1:12 PM  [facility] [place], [place], [address] Phone: [phone]   Fax:  [phone]  Name: Zaveon Gillen MRN: [id number] Date of Birth: 11-14-1945

## 2017-11-26 NOTE — Patient Instructions (Signed)
Axial Extension (Chin Tuck)    Pull chin in and lengthen back of neck. Hold __5__ seconds while counting out loud. Repeat __10__ times. Do __several__ sessions per day.  Shoulder Blade Squeeze   Can use swim noodle for tactile cue  Rotate shoulders back, then squeeze shoulder blades down and back. Hold 10 sec Repeat _10___ times. Do __several__ sessions per day.  Upper Back Strength: Lower Trapezius / Rotator Cuff " L's "     Arms in waitress pose, palms up. Press hands back and slide shoulder blades down. Hold for __5__ seconds. Repeat _10___ times. 1-2 times per day.    Scapular Retraction: Elbow Flexion (Standing)  "W's"     With elbows bent to 90, pinch shoulder blades together and rotate arms out, keeping elbows bent. Repeat __10__ times per set. Do __1-2__ sets per session. Do _several ___ sessions per day.  Scapula Adduction With Pectoralis Stretch: Low - Standing   Shoulders at 45 hands even with shoulders, keeping weight through legs, shift weight forward until you feel pull or stretch through the front of your chest. Hold _30__ seconds. Do _3__ times, _2-4__ times per day.   Scapula Adduction With Pectoralis Stretch: Mid-Range - Standing   Shoulders at 90 elbows even with shoulders, keeping weight through legs, shift weight forward until you feel pull or strength through the front of your chest. Hold __30_ seconds. Do _3__ times, __2-4_ times per day.   Scapula Adduction With Pectoralis Stretch: High - Standing   Shoulders at 120 hands up high on the doorway, keeping weight on feet, shift weight forward until you feel pull or stretch through the front of your chest. Hold _30__ seconds. Do _3__ times, _2-3__ times per day.   Trigger Point Dry Needling  . What is Trigger Point Dry Needling (DN)? o DN is a physical therapy technique used to treat muscle pain and dysfunction. Specifically, DN helps deactivate muscle trigger points (muscle knots).  o A  thin filiform needle is used to penetrate the skin and stimulate the underlying trigger point. The goal is for a local twitch response (LTR) to occur and for the trigger point to relax. No medication of any kind is injected during the procedure.   . What Does Trigger Point Dry Needling Feel Like?  o The procedure feels different for each individual patient. Some patients report that they do not actually feel the needle enter the skin and overall the process is not painful. Very mild bleeding may occur. However, many patients feel a deep cramping in the muscle in which the needle was inserted. This is the local twitch response.   Marland Kitchen How Will I feel after the treatment? o Soreness is normal, and the onset of soreness may not occur for a few hours. Typically this soreness does not last longer than two days.  o Bruising is uncommon, however; ice can be used to decrease any possible bruising.  o In rare cases feeling tired or nauseous after the treatment is normal. In addition, your symptoms may get worse before they get better, this period will typically not last longer than 24 hours.   . What Can I do After My Treatment? o Increase your hydration by drinking more water for the next 24 hours. o You may place ice or heat on the areas treated that have become sore, however, do not use heat on inflamed or bruised areas. Heat often brings more relief post needling. o You can continue your regular activities, but  vigorous activity is not recommended initially after the treatment for 24 hours. o DN is best combined with other physical therapy such as strengthening, stretching, and other therapies.    [facility] [place], [place] [address]  [phone] (office) [phone] (fax)

## 2017-11-28 ENCOUNTER — Ambulatory Visit (INDEPENDENT_AMBULATORY_CARE_PROVIDER_SITE_OTHER): Payer: Medicare HMO | Admitting: Physical Therapy

## 2017-11-28 DIAGNOSIS — R29898 Other symptoms and signs involving the musculoskeletal system: Secondary | ICD-10-CM

## 2017-11-28 DIAGNOSIS — R293 Abnormal posture: Secondary | ICD-10-CM | POA: Diagnosis not present

## 2017-11-28 DIAGNOSIS — M542 Cervicalgia: Secondary | ICD-10-CM | POA: Diagnosis not present

## 2017-11-28 NOTE — Patient Instructions (Addendum)
Row: Low (Machine)    Bend arms, pulling handles back to sides of chest. Do _2-3___ sets. Complete _10___ repetitions.  * Lower weight of low back machine, overhead press, and chest.   Thoracic Self-Mobilization (Sitting)    With small rolled towel at lower ribs level, gently lean back until stretch is felt. Hold __5-10__ seconds. Relax. Repeat __3__ times per set.   Thoracic Self-Mobilization (Supine)    With rolled towel placed lengthwise at lower ribs level, lie back on towel with arms outstretched. Hold __60__ seconds. Relax. Repeat __2-3__ times per set. Do __1__ sessions per day.  Supine: Leg Stretch With Strap (Advanced)    Lie on back with one knee bent, foot flat on floor. Hook strap around other foot. Straighten knee. Raise leg to maximal stretch and straighten knee further by tightening quadriceps muscle. Hold _30__ seconds. Warning: Intense stretch. Stay within tolerance.  Repeat __2_ times per session. Do __2_ sessions per day.

## 2017-11-28 NOTE — Therapy (Signed)
[place], [place], [address] Phone: [phone]   Fax:  [phone]  Physical Therapy Treatment  Patient Details  Name: Kevin Wood MRN: [id number] Date of Birth: 10/12/1946 Referring Provider: Dr. Lynne Leader    Encounter Date: 11/28/2017  PT End of Session - 11/28/17 0809    Visit Number  2    Number of Visits  12    Date for PT Re-Evaluation  01/07/18    PT Start Time  0807 pt arrived late    PT Stop Time  0857    PT Time Calculation (min)  50 min    Activity Tolerance  Patient tolerated treatment well;No increased pain    Behavior During Therapy  WFL for tasks assessed/performed       Past Medical History:  Diagnosis Date  . Elevated blood pressure 12/12/2012  . H/O degenerative disc disease 12/17/2012  . History of nephrolithiasis 12/17/2012  . Hyperlipidemia 12/12/2012  . Lumbar vertebral fracture ([place]  . Type 2 diabetes mellitus ([place]) 12/17/2012  . Unspecified vitamin D deficiency 12/17/2012   Outside Records     Past Surgical History:  Procedure Laterality Date  . c spine fustion  1994  . colon cancer  father  . GALLBLADDER SURGERY  2012    There were no vitals filed for this visit.  Subjective Assessment - 11/28/17 0809    Subjective  "I'm just sore".  He reports he is feeling better with more movement in neck.     Pain Score  3     Pain Location  Neck    Pain Orientation  Right;Left;Lower    Aggravating Factors   lying down. sitting in chair    Pain Relieving Factors  hot tub, estim, stretches         [facility] PT Assessment - 11/28/17 0001      Assessment   Medical Diagnosis  Cervical dysfunction; LBP     Referring Provider  Dr. Lynne Leader     Onset Date/Surgical Date  11/05/17 neck pain for ~ 20 years     Hand Dominance  Left    Next MD Visit  PRN       Flexibility   Soft Tissue Assessment /Muscle Length  yes    Hamstrings  Rt 68 deg; Lt 74  deg  measured during hamstring stretch, opp knee bent       [facility] Adult PT Treatment/Exercise - 11/28/17 0001      Neck Exercises: Seated   Other Seated Exercise  thoracic ext over back of chair (hands supporting head) x 10 sec x 5 reps      Neck Exercises: Supine   Other Supine Exercise  prolonged snow angel stretch with arms at 90 deg (hooklying on pool noodle) x 60 sec, 3 reps      Lumbar Exercises: Stretches   Passive Hamstring Stretch  Right;Left;3 reps;30 seconds supine with strap    Piriformis Stretch  Right;Left;2 reps 45 sec.       Shoulder Exercises: Pulleys   Flexion  1 minute      Shoulder Exercises: Stretch   Other Shoulder Stretches  3 way doorway stretch 30 sec x 2 each position       Moist Heat Therapy   Number Minutes Moist Heat  15 Minutes    Moist Heat Location  Cervical thoracic      Electrical Stimulation   Electrical Stimulation Location  bilat cervical  paraspinals /bilat levator     Electrical Stimulation Action  IFC    Electrical Stimulation Parameters  to tolerance     Electrical Stimulation Goals  Tone;Pain             PT Education - 11/28/17 [phone]    Education provided  Yes    Education Details  HEP     Person(s) Educated  Patient    Methods  Explanation;Handout;Verbal cues;Demonstration    Comprehension  Verbalized understanding;Returned demonstration          PT Long Term Goals - 11/26/17 1308      PT LONG TERM GOAL #1   Title  Improve posture and alignment with patient to demonstrate more upright posture/position of head over chest/shoudlers allowing improved cervical positioning and improved movement quality thus decreasing pain and improving function 01/07/18    Time  6    Period  Weeks    Status  New      PT LONG TERM GOAL #2   Title  Increase cervical ROM in lateral flexion and rotation by 5-8 degrees 01/07/18    Time  6    Period  Weeks    Status  New      PT LONG TERM GOAL #3   Title  Patient to report decreased  stiffness with movement and improved tolerance for sitting to 10-15 min with minimal to no increase in pain 01/07/18    Time  6    Period  Weeks    Status  New      PT LONG TERM GOAL #4   Title  Independent in HEP with good program to improve mobility 01/07/18    Time  6    Period  Weeks    Status  New      PT LONG TERM GOAL #5   Title  Improve FOTO to </= 33% limitation 01/07/18    Time  6    Period  Weeks    Status  New            Plan - 11/28/17 8502    Clinical Impression Statement  Pt tolerated all exercises well, reporting reduction of neck / back pain after completing.  Discussed pt's currect gym routine, which he stated he uses treadmill and weight machines (including overhead press of 90#, back ext of 130#, fly machine, abdominal crunch machine).  Encouraged pt to decrease weights at gym on equipment that effects neck/low back.  Pt progressing towards goals.     Rehab Potential  Good    PT Frequency  2x / week    PT Duration  6 weeks    PT Treatment/Interventions  Patient/family education;ADLs/Self Care Home Management;Cryotherapy;Electrical Stimulation;Iontophoresis 4mg /ml Dexamethasone;Moist Heat;Ultrasound;Dry needling;Manual techniques;Neuromuscular re-education;Traction;Therapeutic activities;Therapeutic exercise    PT Next Visit Plan  continue postural correction and strengthening.  Review HEP.  Manual therapy as indicated.     Consulted and Agree with Plan of Care  Patient       Patient will benefit from skilled therapeutic intervention in order to improve the following deficits and impairments:  Postural dysfunction, Improper body mechanics, Pain, Increased fascial restricitons, Increased muscle spasms, Decreased mobility, Decreased range of motion, Decreased strength, Decreased activity tolerance  Visit Diagnosis: Cervicalgia  Other symptoms and signs involving the musculoskeletal system  Abnormal posture     Problem List Patient Active Problem List    Diagnosis Date Noted  . Thyroid nodule 11/16/2017  . Carotid arterial disease ([place]) 11/14/2017  . Seborrheic keratoses  11/07/2016  . Acute medial meniscus tear of left knee 05/15/2016  . Internal hemorrhoids 04/12/2015  . 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 Essential hypertension 08/11/2013  . Controlled type 2 diabetes mellitus without complication, without long-term current use of insulin ([place]) 12/17/2012  . History of nephrolithiasis 12/17/2012  . Vitamin D deficiency 12/17/2012  . Excessive cerumen in ear canal 12/17/2012  . H/O degenerative disc disease 12/17/2012  . 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 Family history of colon cancer 12/17/2012  . Hyperlipidemia 12/12/2012   Kerin Perna, PTA 11/28/17 8:52 AM  [facility] [place], [place], [address] Phone: [phone]   Fax:  [phone]  Name: Kevin Wood MRN: [id number] Date of Birth: 24-Oct-1946

## 2017-12-03 ENCOUNTER — Other Ambulatory Visit (HOSPITAL_COMMUNITY)
Admission: RE | Admit: 2017-12-03 | Discharge: 2017-12-03 | Disposition: A | Discharge status: Home / Self Care | Payer: Medicare HMO | Source: Ambulatory Visit | Attending: General Surgery | Admitting: General Surgery

## 2017-12-03 ENCOUNTER — Ambulatory Visit
Admission: RE | Admit: 2017-12-03 | Discharge: 2017-12-03 | Disposition: A | Discharge status: Home / Self Care | Payer: Medicare HMO | Source: Ambulatory Visit | Attending: Family Medicine | Admitting: Family Medicine

## 2017-12-03 ENCOUNTER — Encounter: Payer: Self-pay | Admitting: Physical Therapy

## 2017-12-03 ENCOUNTER — Ambulatory Visit (INDEPENDENT_AMBULATORY_CARE_PROVIDER_SITE_OTHER): Payer: Medicare HMO | Admitting: Physical Therapy

## 2017-12-03 DIAGNOSIS — E041 Nontoxic single thyroid nodule: Secondary | ICD-10-CM | POA: Insufficient documentation

## 2017-12-03 DIAGNOSIS — M542 Cervicalgia: Secondary | ICD-10-CM | POA: Diagnosis not present

## 2017-12-03 DIAGNOSIS — R29898 Other symptoms and signs involving the musculoskeletal system: Secondary | ICD-10-CM | POA: Diagnosis not present

## 2017-12-03 DIAGNOSIS — R293 Abnormal posture: Secondary | ICD-10-CM

## 2017-12-03 NOTE — Therapy (Signed)
[place], [place], [address] Phone: [phone]   Fax:  [phone]  Physical Therapy Treatment  Patient Details  Name: Kevin Wood MRN: [id number] Date of Birth: 07-Apr-1946 Referring Provider: Dr Steva Colder   Encounter Date: 12/03/2017  PT End of Session - 12/03/17 0801    Visit Number  3    Number of Visits  12    Date for PT Re-Evaluation  01/07/18    PT Start Time  0801    PT Stop Time  0859    PT Time Calculation (min)  58 min    Activity Tolerance  Patient tolerated treatment well       Past Medical History:  Diagnosis Date  . Elevated blood pressure 12/12/2012  . H/O degenerative disc disease 12/17/2012  . History of nephrolithiasis 12/17/2012  . Hyperlipidemia 12/12/2012  . Lumbar vertebral fracture ([place]) [employer]  . Type 2 diabetes mellitus ([place]) 12/17/2012  . Unspecified vitamin D deficiency 12/17/2012   Outside Records     Past Surgical History:  Procedure Laterality Date  . c spine fustion  1994  . colon cancer  father  . GALLBLADDER SURGERY  2012    There were no vitals filed for this visit.  Subjective Assessment - 12/03/17 0803    Subjective  [name] reports he woke up really stiff this AM, has been going to the gym and using the new machines, and he dropped the weights for the chest ex and overhead    Patient Stated Goals  relieve some of the pain in his neck     Currently in Pain?  No/denies stiffness in the low back         [facility] PT Assessment - 12/03/17 0001      Assessment   Medical Diagnosis  Cervical dysfunction; LBP     Referring Provider  Dr Steva Colder    Onset Date/Surgical Date  11/05/17      AROM   Cervical - Right Side Bend  22    Cervical - Left Side Bend  21    Cervical - Right Rotation  57    Cervical - Left Rotation  54                  [facility] Adult PT Treatment/Exercise - 12/03/17 0001      Exercises   Exercises  Neck       Neck Exercises: Machines for Strengthening   UBE (Upper Arm Bike)  L2x3' BWD      Neck Exercises: Supine   Cervical Isometrics  Extension;3 secs;20 reps into pillow    Shoulder ABduction  20 reps;Both horizontal abduction with red band    Shoulder Abduction Limitations  while bridging      Neck Exercises: Prone   Other Prone Exercise  POE with serratus pushes      Lumbar Exercises: Stretches   Lower Trunk Rotation  2 reps;20 seconds    Other Lumbar Stretch Exercise  whole back stretch over black bolster with knees bent then straight      Modalities   Modalities  Electrical Stimulation;Moist Heat      Moist Heat Therapy   Number Minutes Moist Heat  15 Minutes    Moist Heat Location  -- thoracic      Electrical Stimulation   Electrical Stimulation Location  bialt thoracic    Electrical Stimulation Action  IFC    Electrical Stimulation Parameters  to  tolerance    Electrical Stimulation Goals  Pain;Tone      Manual Therapy   Manual Therapy  Joint mobilization    Joint Mobilization  grade III mobs sacrum through thoracic he is hypomobile mainly throught thoracic region.       Neck Exercises: Stretches   Other Neck Stretches  doorway stretches x 30 sec                  PT Long Term Goals - 12/03/17 0807      PT LONG TERM GOAL #1   Title  Improve posture and alignment with patient to demonstrate more upright posture/position of head over chest/shoudlers allowing improved cervical positioning and improved movement quality thus decreasing pain and improving function 01/07/18    Status  On-going      PT LONG TERM GOAL #2   Title  Increase cervical ROM in lateral flexion and rotation by 5-8 degrees 01/07/18    Status  Achieved      PT LONG TERM GOAL #3   Title  Patient to report decreased stiffness with movement and improved tolerance for sitting to 10-15 min with minimal to no increase in pain 01/07/18    Status  On-going improving in the neck      PT LONG TERM GOAL  #4   Title  Independent in HEP with good program to improve mobility 01/07/18    Status  On-going      PT LONG TERM GOAL #5   Title  Improve FOTO to </= 33% limitation 01/07/18    Status  On-going            Plan - 12/03/17 0845    Clinical Impression Statement  Bailee is hypomobile in the spine, responds well to mobilizations followed by thoracic stretching.  His neck is much better with improved motion and he met that goal.  Would benefit from continued treatment to work on improving spinal mobility, posture and back of the body stregthening.      Rehab Potential  Good    PT Frequency  2x / week    PT Duration  6 weeks    PT Treatment/Interventions  Patient/family education;ADLs/Self Care Home Management;Cryotherapy;Electrical Stimulation;Iontophoresis 33m/ml Dexamethasone;Moist Heat;Ultrasound;Dry needling;Manual techniques;Neuromuscular re-education;Traction;Therapeutic activities;Therapeutic exercise    PT Next Visit Plan  continue spinal mobility either with mobs or exercise.      Consulted and Agree with Plan of Care  Patient       Patient will benefit from skilled therapeutic intervention in order to improve the following deficits and impairments:  Postural dysfunction, Improper body mechanics, Pain, Increased fascial restricitons, Increased muscle spasms, Decreased mobility, Decreased range of motion, Decreased strength, Decreased activity tolerance  Visit Diagnosis: Cervicalgia  Other symptoms and signs involving the musculoskeletal system  Abnormal posture     Problem List Patient Active Problem List   Diagnosis Date Noted  . Thyroid nodule 11/16/2017  . [place] 11/14/2017  . Seborrheic keratoses 11/07/2016  . Acute medial meniscus tear of left knee 05/15/2016  . Internal hemorrhoids 04/12/2015  . 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 Essential hypertension 08/11/2013  . Controlled type 2 diabetes mellitus without complication, without  long-term current use of insulin ([place] 12/17/2012  . History of nephrolithiasis 12/17/2012  . Vitamin D deficiency 12/17/2012  . Excessive cerumen in ear canal 12/17/2012  . H/O degenerative disc disease 12/17/2012  . 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 Family history of colon cancer 12/17/2012  .  Hyperlipidemia 12/12/2012    Boneta Lucks rPT  12/03/2017, 8:52 AM  [facility] [place], [place], [address] Phone: [phone]   Fax:  [phone]  Name: Geraldine Tesar MRN: [id number] Date of Birth: 11/03/1946

## 2017-12-05 ENCOUNTER — Ambulatory Visit (INDEPENDENT_AMBULATORY_CARE_PROVIDER_SITE_OTHER): Payer: Medicare HMO | Admitting: Physical Therapy

## 2017-12-05 DIAGNOSIS — R29898 Other symptoms and signs involving the musculoskeletal system: Secondary | ICD-10-CM | POA: Diagnosis not present

## 2017-12-05 DIAGNOSIS — R293 Abnormal posture: Secondary | ICD-10-CM

## 2017-12-05 DIAGNOSIS — M542 Cervicalgia: Secondary | ICD-10-CM | POA: Diagnosis not present
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
## 2017-12-09 ENCOUNTER — Ambulatory Visit (INDEPENDENT_AMBULATORY_CARE_PROVIDER_SITE_OTHER): Payer: Medicare HMO | Admitting: Physical Therapy

## 2017-12-09 DIAGNOSIS — R29898 Other symptoms and signs involving the musculoskeletal system: Secondary | ICD-10-CM | POA: Diagnosis not present

## 2017-12-09 DIAGNOSIS — M542 Cervicalgia: Secondary | ICD-10-CM

## 2017-12-09 DIAGNOSIS — R293 Abnormal posture: Secondary | ICD-10-CM | POA: Diagnosis not present
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
## 2017-12-11 ENCOUNTER — Encounter: Payer: Self-pay | Admitting: Physical Therapy

## 2017-12-11 ENCOUNTER — Ambulatory Visit (INDEPENDENT_AMBULATORY_CARE_PROVIDER_SITE_OTHER): Payer: Medicare HMO | Admitting: Physical Therapy

## 2017-12-11 DIAGNOSIS — M542 Cervicalgia: Secondary | ICD-10-CM

## 2017-12-11 DIAGNOSIS — R293 Abnormal posture: Secondary | ICD-10-CM

## 2017-12-11 DIAGNOSIS — R29898 Other symptoms and signs involving the musculoskeletal system: Secondary | ICD-10-CM

## 2017-12-11 NOTE — Therapy (Signed)
[place], [place], [address] Phone: [phone]   Fax:  [phone]  Physical Therapy Treatment  Patient Details  Name: Kevin Wood MRN: [id number] Date of Birth: 1946/03/25 Referring Provider: Dr. Lynne Leader   Encounter Date: 12/11/2017  PT End of Session - 12/11/17 0802    Visit Number  6    Number of Visits  12    Date for PT Re-Evaluation  01/07/18    PT Start Time  0802    PT Stop Time  0906    PT Time Calculation (min)  64 min       Past Medical History:  Diagnosis Date  . Elevated blood pressure 12/12/2012  . H/O degenerative disc disease 12/17/2012  . History of nephrolithiasis 12/17/2012  . Hyperlipidemia 12/12/2012  . Lumbar vertebral fracture ([place]) [place]  . Type 2 diabetes mellitus ([place]) 12/17/2012  . Unspecified vitamin D deficiency 12/17/2012   Outside Records     Past Surgical History:  Procedure Laterality Date  . c spine fustion  1994  . colon cancer  father  . GALLBLADDER SURGERY  2012    There were no vitals filed for this visit.  Subjective Assessment - 12/11/17 0803    Subjective  Pt reports he has a sore elbow today, worked in the yard tearing out bushes.  Still woke without pain was also able to ride the motorcycle.     Currently in Pain?  No/denies                      [facility] Adult PT Treatment/Exercise - 12/11/17 0001      Neck Exercises: Machines for Strengthening   UBE (Upper Arm Bike)  L2x3' BWD      Neck Exercises: Standing   Other Standing Exercises  25 reps shoulder shrugs with 3#      Modalities   Modalities  Electrical Stimulation;Moist Heat      Moist Heat Therapy   Number Minutes Moist Heat  15 Minutes    Moist Heat Location  Cervical thoracic      Electrical Stimulation   Electrical Stimulation Location  cervical/thoracic     Electrical Stimulation Action  IFC    Electrical Stimulation Parameters   to toleracn     Electrical Stimulation Goals  Tone;Pain      Manual Therapy   Manual Therapy  Joint mobilization    Joint Mobilization  thoracic mobs grade III with focus at T5-9      Neck Exercises: Stretches   Other Neck Stretches  over black bolster and yoga eggs in supine for thoracic openers    Other Neck Stretches  doorway stretches using strap for chest and shoulders                   PT Long Term Goals - 12/09/17 0811      PT LONG TERM GOAL #1   Title  Improve posture and alignment with patient to demonstrate more upright posture/position of head over chest/shoudlers allowing improved cervical positioning and improved movement quality thus decreasing pain and improving function 01/07/18    Time  6    Period  Weeks    Status  On-going improving      PT LONG TERM GOAL #2   Title  Increase cervical ROM in lateral flexion and rotation by 5-8 degrees 01/07/18    Time  6    Period  Weeks  Status  Achieved      PT LONG TERM GOAL #3   Title  Patient to report decreased stiffness with movement and improved tolerance for sitting to 10-15 min with minimal to no increase in pain 01/07/18    Status  On-going can tolerate 10 min prior to increase  in pain.       PT LONG TERM GOAL #4   Title  Independent in HEP with good program to improve mobility 01/07/18    Time  6    Period  Weeks    Status  On-going      PT LONG TERM GOAL #5   Title  Improve FOTO to </= 33% limitation 01/07/18    Time  6    Period  Weeks    Status  On-going            Plan - 12/11/17 0846    Clinical Impression Statement  Dillyn is doing very well this week.  He is slowly starting to return to all his prior activity.  He is making great progress, having better upright positioning especially in the thoracic area.     Rehab Potential  Good    PT Frequency  2x / week    PT Duration  6 weeks    PT Treatment/Interventions  Patient/family education;ADLs/Self Care Home Management;Cryotherapy;Electrical  Stimulation;Iontophoresis 4mg /ml Dexamethasone;Moist Heat;Ultrasound;Dry needling;Manual techniques;Neuromuscular re-education;Traction;Therapeutic activities;Therapeutic exercise    PT Next Visit Plan  Pt to return to the gym next week, if still doing well he will be ready for discharge at end of next week.     Consulted and Agree with Plan of Care  Patient       Patient will benefit from skilled therapeutic intervention in order to improve the following deficits and impairments:  Postural dysfunction, Improper body mechanics, Pain, Increased fascial restricitons, Increased muscle spasms, Decreased mobility, Decreased range of motion, Decreased strength, Decreased activity tolerance  Visit Diagnosis: Cervicalgia  Other symptoms and signs involving the musculoskeletal system  Abnormal posture     Problem List Patient Active Problem List   Diagnosis Date Noted  . Thyroid nodule 11/16/2017  . Carotid arterial disease ([place]) 11/14/2017  . Seborrheic keratoses 11/07/2016  . Acute medial meniscus tear of left knee 05/15/2016  . Internal hemorrhoids 04/12/2015  . 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 Essential hypertension 08/11/2013  . Controlled type 2 diabetes mellitus without complication, without long-term current use of insulin ([place]) 12/17/2012  . History of nephrolithiasis 12/17/2012  . Vitamin D deficiency 12/17/2012  . Excessive cerumen in ear canal 12/17/2012  . H/O degenerative disc disease 12/17/2012  . 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 Family history of colon cancer 12/17/2012  . Hyperlipidemia 12/12/2012    Jeral Pinch PT  12/11/2017, 10:35 AM  [facility] [place], [place], [address] Phone: [phone]   Fax:  [phone]  Name: Sue Mcalexander MRN: [id number] Date of Birth: Apr 08, 1946

## 2017-12-16 ENCOUNTER — Ambulatory Visit: Payer: Medicare HMO | Admitting: Physical Therapy

## 2017-12-16 DIAGNOSIS — R29898 Other symptoms and signs involving the musculoskeletal system: Secondary | ICD-10-CM

## 2017-12-16 DIAGNOSIS — M542 Cervicalgia: Secondary | ICD-10-CM | POA: Diagnosis not present

## 2017-12-16 DIAGNOSIS — R293 Abnormal posture: Secondary | ICD-10-CM | POA: Diagnosis not present

## 2017-12-16 NOTE — Therapy (Signed)
[place], [place], [address] Phone: [phone]   Fax:  [phone]  Physical Therapy Treatment  Patient Details  Name: Kevin Wood MRN: [id number] Date of Birth: 1945-11-09 Referring Provider: Dr. Lynne Leader   Encounter Date: 12/16/2017  PT End of Session - 12/16/17 1151    Visit Number  7    Number of Visits  12    Date for PT Re-Evaluation  01/07/18    PT Start Time  1148    PT Stop Time  1245    PT Time Calculation (min)  57 min    Activity Tolerance  Patient tolerated treatment well    Behavior During Therapy  [facility] for tasks assessed/performed       Past Medical History:  Diagnosis Date  . Elevated blood pressure 12/12/2012  . H/O degenerative disc disease 12/17/2012  . History of nephrolithiasis 12/17/2012  . Hyperlipidemia 12/12/2012  . Lumbar vertebral fracture ([place]) [place]  . Type 2 diabetes mellitus ([place]) 12/17/2012  . Unspecified vitamin D deficiency 12/17/2012   Outside Records     Past Surgical History:  Procedure Laterality Date  . c spine fustion  1994  . colon cancer  father  . GALLBLADDER SURGERY  2012    There were no vitals filed for this visit.  Subjective Assessment - 12/16/17 1151    Subjective  Pt reports his low back is bothering him today, but his neck is doing "real well".     Currently in Pain?  Yes    Pain Score  1     Pain Location  Neck    Pain Orientation  Right;Left    Pain Descriptors / Indicators  Sore    Aggravating Factors   sitting too long    Pain Relieving Factors  hot tub and massage chair          [facility] PT Assessment - 12/16/17 0001      Assessment   Medical Diagnosis  Cervical dysfunction; LBP     Referring Provider  Dr. Lynne Leader    Onset Date/Surgical Date  11/05/17    Hand Dominance  Left    Next MD Visit  PRN        [facility] Adult PT Treatment/Exercise - 12/16/17 0001      Neck Exercises: Machines for  Strengthening   UBE (Upper Arm Bike)  L3x3' BWD standing      Neck Exercises: Supine   Other Supine Exercise  on 1/2 foam bolster:  20 reps of each overhead pull, bilat horz abdct, and Sash with green band - followed by prolonged stretch with arms in T.      Neck Exercises: Sidelying   Other Sidelying Exercise  upper body rotations, pulling on red band, each side      Modalities   Modalities  Electrical Stimulation      Moist Heat Therapy   Number Minutes Moist Heat  15 Minutes    Moist Heat Location  Cervical thoracic      Electrical Stimulation   Electrical Stimulation Location  cervical / thoracic    Electrical Stimulation Action  IFC    Electrical Stimulation Parameters  To tolerance     Electrical Stimulation Goals  Pain;Tone      Manual Therapy   Manual Therapy  Myofascial release;Soft tissue mobilization    Soft tissue mobilization  STM to bilat cervical paraspinals     Myofascial Release  MFR to  bilat thoracic paraspinals; suboccipital release.       Neck Exercises: Stretches   Upper Trapezius Stretch  Right;Left;3 reps;10 seconds    Other Neck Stretches  3 position doorway stretch x 30 sec x 2 reps each position.              PT Education - 12/16/17 1233    Education provided  Yes    Education Details  HEP - added supine scap stabilization routine and issued red/green band     Person(s) Educated  Patient    Methods  Explanation;Handout;Verbal cues;Demonstration    Comprehension  Verbalized understanding;Returned demonstration          PT Long Term Goals - 12/16/17 1154      PT LONG TERM GOAL #1   Title  Improve posture and alignment with patient to demonstrate more upright posture/position of head over chest/shoudlers allowing improved cervical positioning and improved movement quality thus decreasing pain and improving function 01/07/18    Time  6    Period  Weeks    Status  On-going      PT LONG TERM GOAL #2   Title  Increase cervical ROM in  lateral flexion and rotation by 5-8 degrees 01/07/18    Time  6    Period  Weeks    Status  Achieved      PT LONG TERM GOAL #3   Title  Patient to report decreased stiffness with movement and improved tolerance for sitting to 10-15 min with minimal to no increase in pain 01/07/18    Time  6    Period  Weeks    Status  Achieved      PT LONG TERM GOAL #4   Title  Independent in HEP with good program to improve mobility 01/07/18    Time  6    Period  Weeks    Status  On-going      PT LONG TERM GOAL #5   Title  Improve FOTO to </= 33% limitation 01/07/18    Time  6    Period  Weeks    Status  On-going            Plan - 12/16/17 1234    Clinical Impression Statement  Pt reports he is now able to sit 15 min prior to pain increasing; has met LTG #3.  Pt tolerated all exercises well, without increase in symptoms. Pt able to tolerate high position of arm for doorway stretch now. Pt verbalized desire to discharge after next visit.      Rehab Potential  Good    PT Frequency  2x / week    PT Duration  6 weeks    PT Treatment/Interventions  Patient/family education;ADLs/Self Care Home Management;Cryotherapy;Electrical Stimulation;Iontophoresis 75m/ml Dexamethasone;Moist Heat;Ultrasound;Dry needling;Manual techniques;Neuromuscular re-education;Traction;Therapeutic activities;Therapeutic exercise    PT Next Visit Plan  assess readiness for d/c; FOTO.     Consulted and Agree with Plan of Care  Patient       Patient will benefit from skilled therapeutic intervention in order to improve the following deficits and impairments:  Postural dysfunction, Improper body mechanics, Pain, Increased fascial restricitons, Increased muscle spasms, Decreased mobility, Decreased range of motion, Decreased strength, Decreased activity tolerance  Visit Diagnosis: Cervicalgia  Other symptoms and signs involving the musculoskeletal system  Abnormal posture     Problem List Patient Active Problem List    Diagnosis Date Noted  . Thyroid nodule 11/16/2017  . Carotid arterial disease ([place] 11/14/2017  .  Seborrheic keratoses 11/07/2016  . Acute medial meniscus tear of left knee 05/15/2016  . Internal hemorrhoids 04/12/2015  . 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 Essential hypertension 08/11/2013  . Controlled type 2 diabetes mellitus without complication, without long-term current use of insulin ([place]) 12/17/2012  . History of nephrolithiasis 12/17/2012  . Vitamin D deficiency 12/17/2012  . Excessive cerumen in ear canal 12/17/2012  . H/O degenerative disc disease 12/17/2012  . 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 Family history of colon cancer 12/17/2012  . Hyperlipidemia 12/12/2012   Kerin Perna, PTA 12/16/17 1:16 PM  [facility] [place], [place], [address] Phone: [phone]   Fax:  [phone]  Name: Kevin Wood MRN: [id number] Date of Birth: Apr 04, 1946

## 2017-12-16 NOTE — Patient Instructions (Signed)
Over Head Pull: Narrow Grip     [place] [phone]   On back, knees bent, feet flat, band across thighs, elbows straight but relaxed. Pull hands apart (start). Keeping elbows straight, bring arms up and over head, hands toward floor. Keep pull steady on band. Hold momentarily. Return slowly, keeping pull steady, back to start. Repeat _10-20__ times. Band color __red or green____   Side Pull: Double Arm   On back, knees bent, feet flat. Arms perpendicular to body, shoulder level, elbows straight but relaxed. Pull arms out to sides, elbows straight. Resistance band comes across collarbones, hands toward floor. Hold momentarily. Slowly return to starting position. Repeat _10-20__ times. Band color _red-green____   Sash   On back, knees bent, feet flat, left hand on left hip, right hand above left. Pull right arm DIAGONALLY (hip to shoulder) across chest. Bring right arm along head toward floor. Hold momentarily. Slowly return to starting position. Repeat _10-20__ times. Do with left arm. Band color __red or green____   Arm Sweep: [facility]    Get ON TARGET. Lie on flat up roller, feet on full roller. Keeping arms on floor, sweep out and up beyond head. Stop and stretch as tightness develops. Hold _5-10__ seconds. Repeat _5__ times.   [facility] at [facility] [place], [place] [address]  [phone] (office) [phone] (fax)

## 2017-12-18 ENCOUNTER — Ambulatory Visit: Payer: Medicare HMO | Admitting: Physical Therapy

## 2017-12-18 DIAGNOSIS — R29898 Other symptoms and signs involving the musculoskeletal system: Secondary | ICD-10-CM | POA: Diagnosis not present

## 2017-12-18 DIAGNOSIS — M542 Cervicalgia: Secondary | ICD-10-CM

## 2017-12-18 DIAGNOSIS — R293 Abnormal posture: Secondary | ICD-10-CM | POA: Diagnosis not present

## 2017-12-18 NOTE — Therapy (Addendum)
[place], [place], [address] Phone: [phone]   Fax:  [phone]  Physical Therapy Treatment  Patient Details  Name: Kevin Wood MRN: [id number] Date of Birth: Dec 05, 1945 Referring Provider: Dr. Lynne Leader   Encounter Date: 12/18/2017  PT End of Session - 12/18/17 0808    Visit Number  8    Number of Visits  12    Date for PT Re-Evaluation  01/07/18    PT Start Time  0806    PT Stop Time  0853    PT Time Calculation (min)  47 min    Activity Tolerance  Patient tolerated treatment well    Behavior During Therapy  [facility] for tasks assessed/performed       Past Medical History:  Diagnosis Date  . Elevated blood pressure 12/12/2012  . H/O degenerative disc disease 12/17/2012  . History of nephrolithiasis 12/17/2012  . Hyperlipidemia 12/12/2012  . Lumbar vertebral fracture ([place]) [place]  . Type 2 diabetes mellitus ([place]) 12/17/2012  . Unspecified vitamin D deficiency 12/17/2012   Outside Records     Past Surgical History:  Procedure Laterality Date  . c spine fustion  1994  . colon cancer  father  . GALLBLADDER SURGERY  2012    There were no vitals filed for this visit.  Subjective Assessment - 12/18/17 0808    Subjective  "I woke up this morning with no pain!"   Pt reports he has cut down the weights at gym and used band for some of workout. His Rt elbow has been bothering him for the last 3 wks, like a toothache on/off. "Not enough to get it checked out".  Pt verbalizes readiness to d/c after todays visit.     Currently in Pain?  No/denies    Pain Score  0-No pain         [facility] PT Assessment - 12/18/17 0001      Assessment   Medical Diagnosis  Cervical dysfunction; LBP     Referring Provider  Dr. Lynne Leader    Onset Date/Surgical Date  11/05/17    Hand Dominance  Left    Next MD Visit  PRN       Observation/Other Assessments   Focus on Therapeutic Outcomes (FOTO)    6% limited       Palpation   Spinal mobility  tightest through T4-5 with joint mobs per Gillermo Murdoch, PT.        [facility] Adult PT Treatment/Exercise - 12/18/17 0001      Neck Exercises: Machines for Strengthening   UBE (Upper Arm Bike)  L3: 2 min forward/ 2 min backward       Neck Exercises: Supine   Other Supine Exercise   20 reps of each overhead pull, bilat horz abdct, and Sash with green band      Neck Exercises: Sidelying   Other Sidelying Exercise  upper body rotations, pulling on green band, each side      Shoulder Exercises: Stretch   Other Shoulder Stretches  3 way doorway stretch 30 sec x 2 each position       Modalities   Modalities  Electrical Stimulation      Moist Heat Therapy   Moist Heat Location  Cervical thoracic      Electrical Stimulation   Electrical Stimulation Location  bilat cervical paraspinals and upper traps    Electrical Stimulation Action  IFC    Electrical Stimulation Parameters  to tolerance    Electrical Stimulation Goals  Pain;Tone      Manual Therapy   Manual Therapy  Myofascial release;Joint mobilization;Soft tissue mobilization    Manual therapy comments  prone     Joint Mobilization  CPA and lateral mobs grade II-III, T4-10 provided by Gillermo Murdoch, PT    Soft tissue mobilization  STM to bilat thoracic paraspinals and posterior shoulders.     Myofascial Release  MFR to bilat thoracic paraspinals                  PT Long Term Goals - 12/18/17 0814      PT LONG TERM GOAL #1   Title  Improve posture and alignment with patient to demonstrate more upright posture/position of head over chest/shoudlers allowing improved cervical positioning and improved movement quality thus decreasing pain and improving function 01/07/18    Time  6    Period  Weeks    Status  Achieved      PT LONG TERM GOAL #2   Title  Increase cervical ROM in lateral flexion and rotation by 5-8 degrees 01/07/18    Time  6    Period  Weeks    Status  Achieved       PT LONG TERM GOAL #3   Title  Patient to report decreased stiffness with movement and improved tolerance for sitting to 10-15 min with minimal to no increase in pain 01/07/18    Time  6    Period  Weeks    Status  Achieved      PT LONG TERM GOAL #4   Title  Independent in HEP with good program to improve mobility 01/07/18    Time  6    Period  Weeks    Status  Achieved      PT LONG TERM GOAL #5   Title  Improve FOTO to </= 33% limitation 01/07/18    Time  6    Period  Weeks    Status  Achieved            Plan - 12/18/17 3903    Clinical Impression Statement  Pt reporting he is now pain free and able to sit longer periods without pain. Pt tolerated all exercises well and has met all goals.  Pt requests to d/c at this time.     Rehab Potential  Good    PT Frequency  2x / week    PT Duration  6 weeks    PT Treatment/Interventions  Patient/family education;ADLs/Self Care Home Management;Cryotherapy;Electrical Stimulation;Iontophoresis 60m/ml Dexamethasone;Moist Heat;Ultrasound;Dry needling;Manual techniques;Neuromuscular re-education;Traction;Therapeutic activities;Therapeutic exercise    PT Next Visit Plan  Spoke to supervising PT; will d/c at this time.     Consulted and Agree with Plan of Care  Patient       Patient will benefit from skilled therapeutic intervention in order to improve the following deficits and impairments:  Postural dysfunction, Improper body mechanics, Pain, Increased fascial restricitons, Increased muscle spasms, Decreased mobility, Decreased range of motion, Decreased strength, Decreased activity tolerance  Visit Diagnosis: Cervicalgia  Other symptoms and signs involving the musculoskeletal system  Abnormal posture     Problem List Patient Active Problem List   Diagnosis Date Noted  . Thyroid nodule 11/16/2017  . Carotid arterial disease ([place] 11/14/2017  . Seborrheic keratoses 11/07/2016  . Acute medial meniscus tear of left knee 05/15/2016  .  Internal hemorrhoids 04/12/2015  . 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 Essential hypertension 08/11/2013  .  Controlled type 2 diabetes mellitus without complication, without long-term current use of insulin ([place]) 12/17/2012  . History of nephrolithiasis 12/17/2012  . Vitamin D deficiency 12/17/2012  . Excessive cerumen in ear canal 12/17/2012  . H/O degenerative disc disease 12/17/2012  . 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 Family history of colon cancer 12/17/2012  . Hyperlipidemia 12/12/2012   Kerin Perna, PTA 12/18/17 8:46 AM   Celyn P. Helene Kelp PT, MPH 12/18/17 8:46 AM    [place], [place], [address] Phone: [phone]   Fax:  [phone]  Name: Kevin Wood MRN: [id number] Date of Birth: [birth year]  PHYSICAL THERAPY DISCHARGE SUMMARY  Visits from Start of Care: 8  Current functional level related to goals / functional outcomes: See progress note for discharge status   Remaining deficits: Unknown    Education / Equipment: HEP  Plan: Patient agrees to discharge.  Patient goals were partially met. Patient is being discharged due to being pleased with the current functional level.  ?????     Celyn P. Helene Kelp PT, MPH 01/31/18 4:02 PM

## 2018-01-12 IMAGING — US US CAROTID DUPLEX BILAT
1 series · 13 of 24 positions shown · non-contrast
Comparison: Cervical radiographs - 11/13/2017

CLINICAL DATA: Bilateral carotid artery disease suspected on
preceding cervical spine radiograph.

EXAM:
BILATERAL CAROTID DUPLEX ULTRASOUND
TECHNIQUE: Gray scale imaging, color Doppler and duplex ultrasound were
performed of bilateral carotid and vertebral arteries in the neck.

[Series 1: us carotid duplex bilat · 0.09mm/px · 13 of 82 slices shown]
[im 1/82]
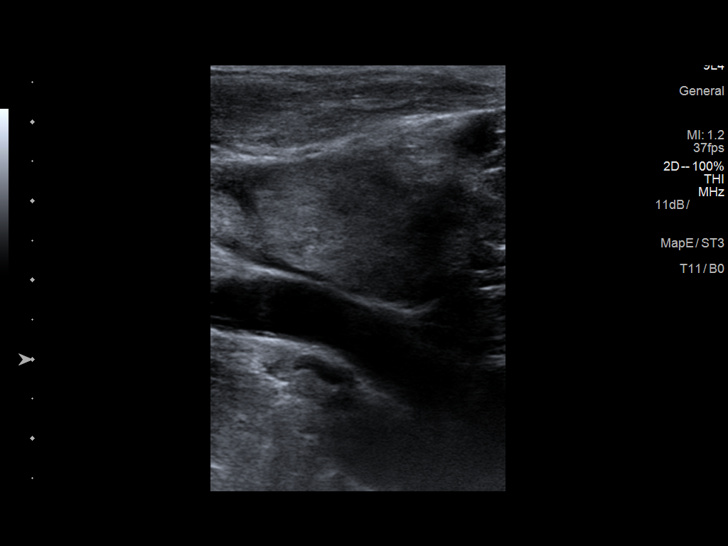
[im 8/82]
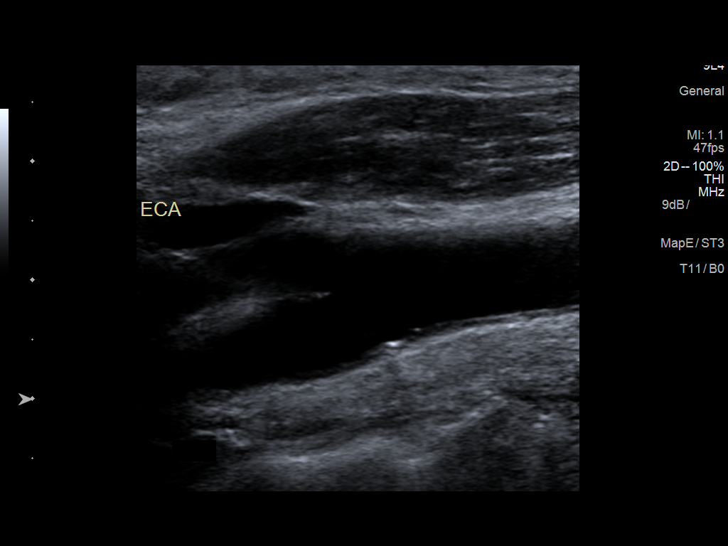
[im 15/82]
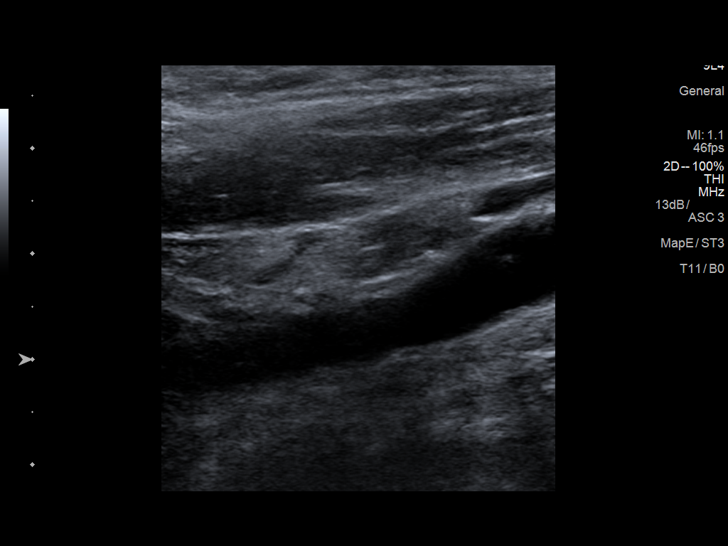
[im 22/82]
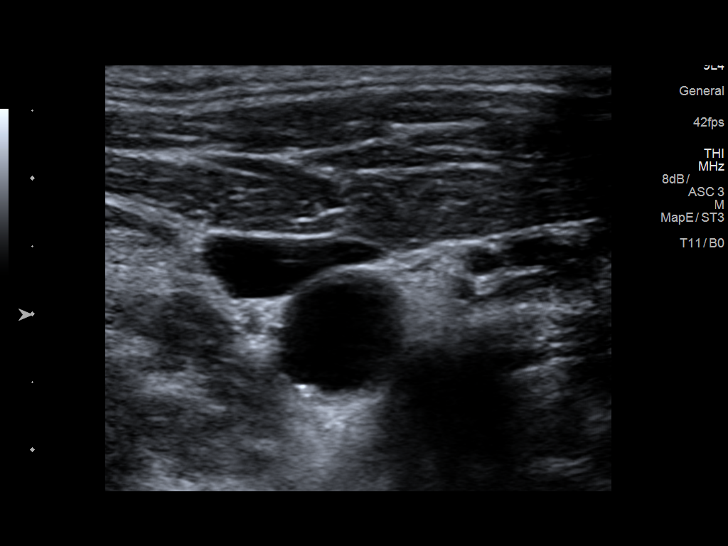
[im 29/82]
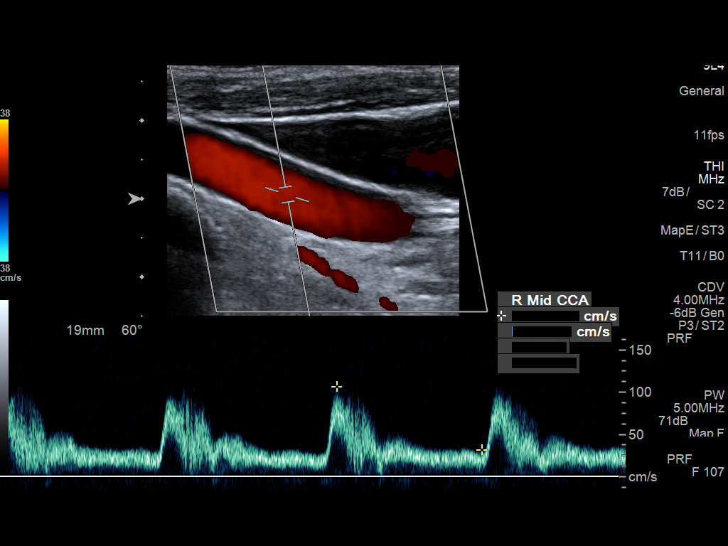
[im 36/82]
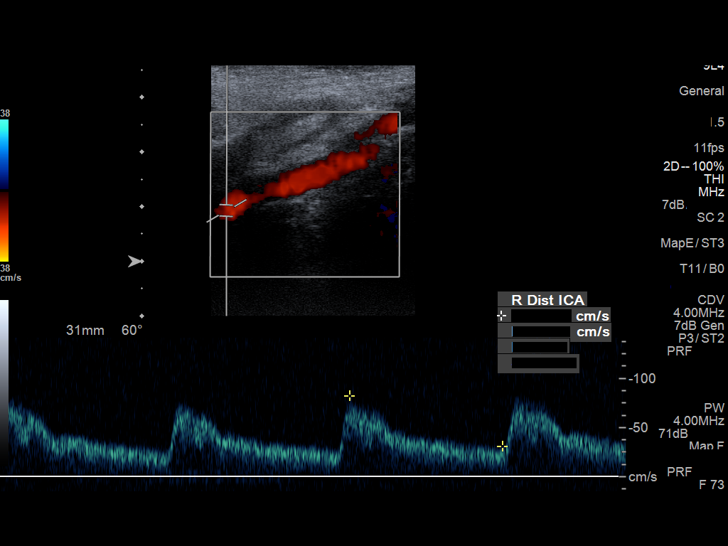
[im 43/82]
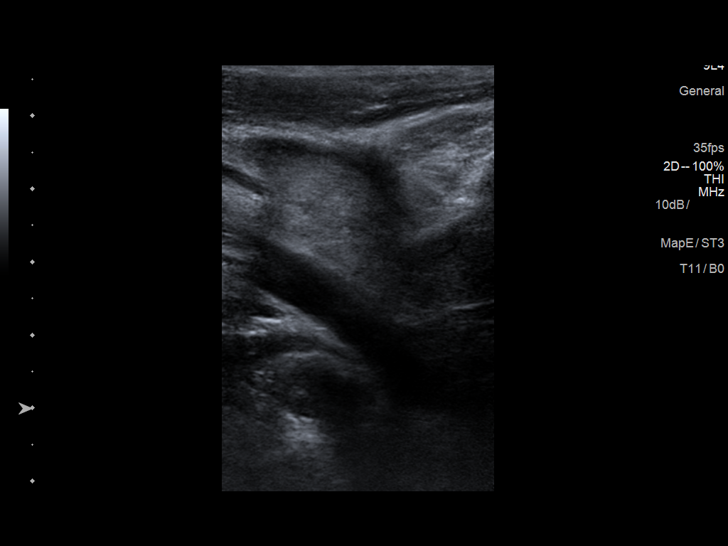
[im 46/82]
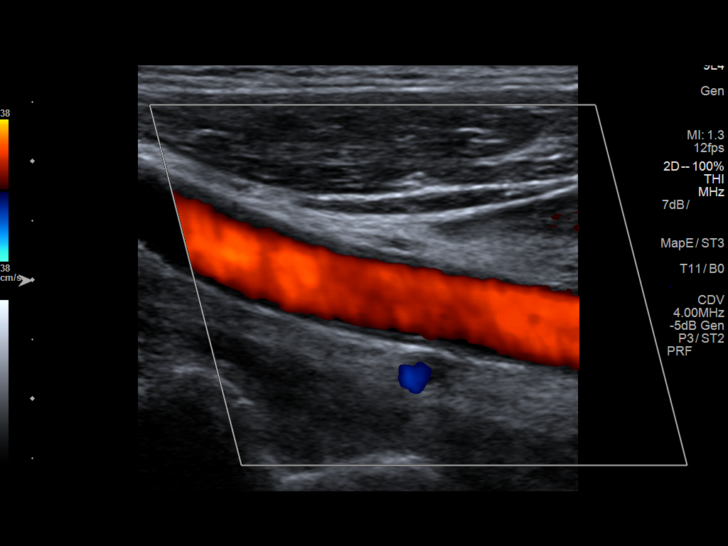
[im 53/82]
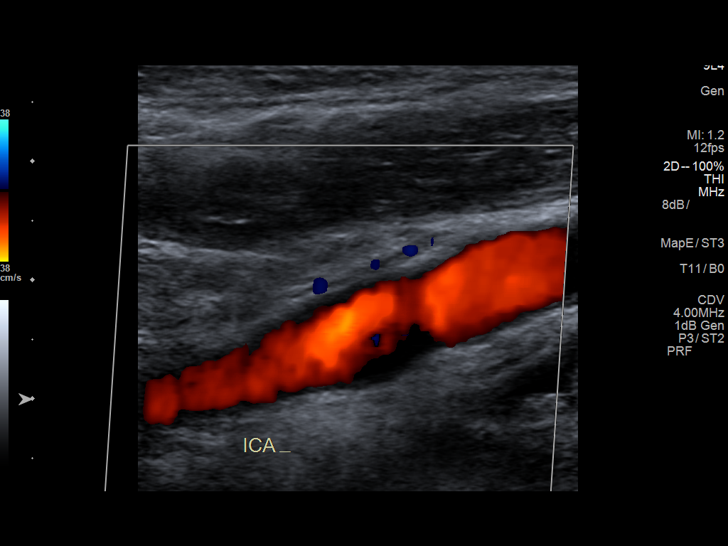
[im 60/82]
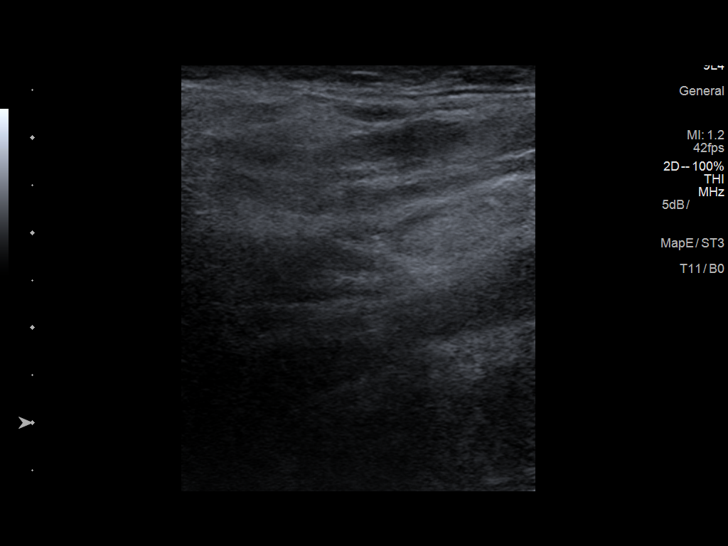
[im 67/82]
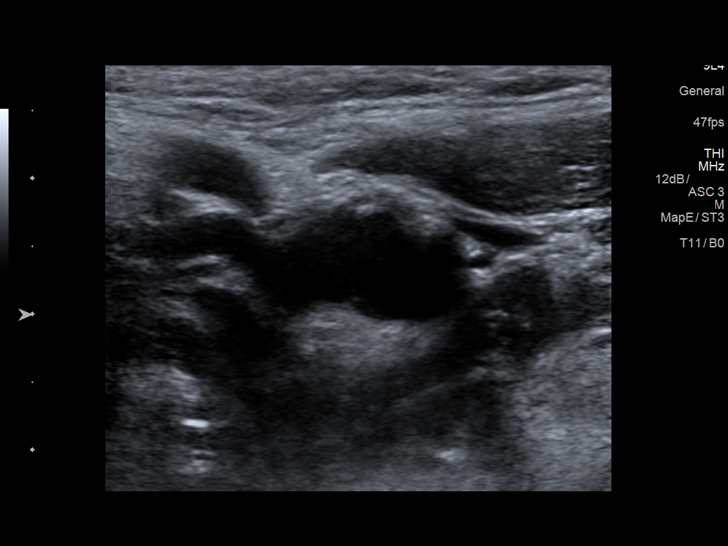
[im 74/82]
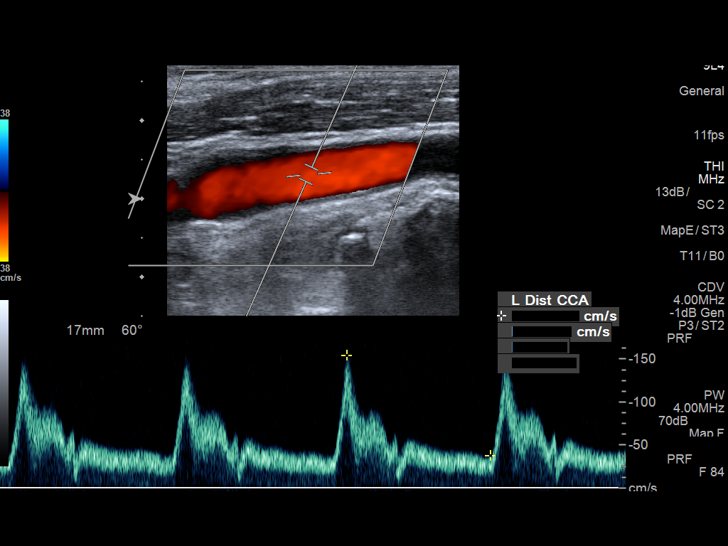
[im 82/82]
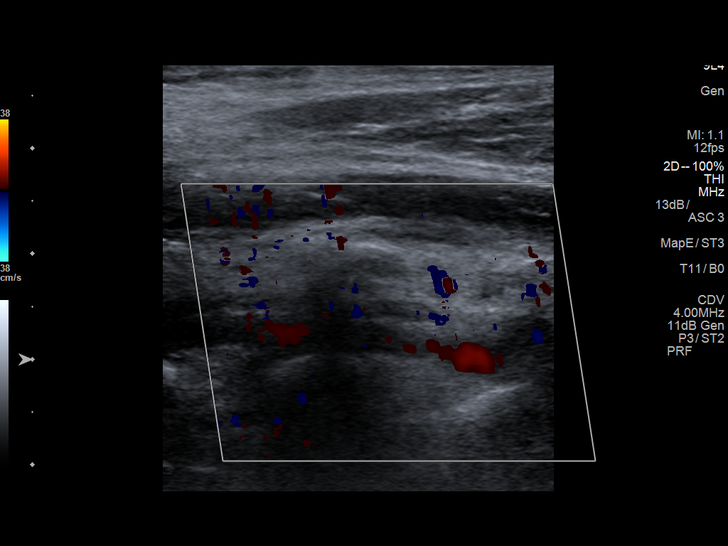

[13 of 24 positions shown; findings below may reference images not displayed]

FINDINGS: Criteria: Quantification of carotid stenosis is based on velocity
parameters that correlate the residual internal carotid diameter
with NASCET-based stenosis levels, using the diameter of the distal
internal carotid lumen as the denominator for stenosis measurement.

The following velocity measurements were obtained:

RIGHT

ICA:  91/29 cm/sec

CCA:  107/32 cm/sec

SYSTOLIC ICA/CCA RATIO:

DIASTOLIC ICA/CCA RATIO:

ECA:  119 cm/sec

LEFT

ICA:  82/22 cm/sec

CCA:  165/38 cm/sec

SYSTOLIC ICA/CCA RATIO:

DIASTOLIC ICA/CCA RATIO:

ECA:  141 cm/sec

RIGHT CAROTID ARTERY: There is a minimal amount of atherosclerotic
plaque involving the origin and proximal aspect of the right
internal carotid artery (images 9 and 14), not resulting in elevated
peak systolic velocities within the interrogated cords or internal
carotid artery to suggest a hemodynamically significant stenosis.

RIGHT VERTEBRAL ARTERY:  Antegrade flow

LEFT CAROTID ARTERY: There is a minimal amount of atherosclerotic
plaque within the left carotid bulb extending to involve the left
internal carotid artery (image 53), not resulting in elevated peak
systolic velocities within the interrogated course of left internal
carotid artery to suggest a hemodynamically significant stenosis.

LEFT VERTEBRAL ARTERY:  Antegrade flow

Note is made of an approximately 2.9 cm nodule within the inferior
aspect of the left lobe of the thyroid
IMPRESSION: 1. Minimal amount of bilateral atherosclerotic plaque right greater
than left, not resulting in a hemodynamically significant stenosis
within either internal carotid artery
2. Incidental note made of an indeterminate approximately 2.9 cm
nodule within the inferior aspect of the left lobe of the thyroid.
Further evaluation with dedicated thyroid ultrasound could be
performed as indicated.

## 2018-01-15 ENCOUNTER — Other Ambulatory Visit: Payer: Self-pay | Admitting: Family Medicine

## 2018-01-18 IMAGING — US US THYROID
1 series · 13 of 25 positions shown · non-contrast
Comparison: Carotid ultrasound 11/15/2017

CLINICAL DATA: Thyroid nodule seen on carotid ultrasound.

EXAM:
THYROID ULTRASOUND
TECHNIQUE: Ultrasound examination of the thyroid gland and adjacent soft
tissues was performed.

[Series 1: us thyroid · 0.07mm/px · 13 of 54 slices shown]
[im 1/54]
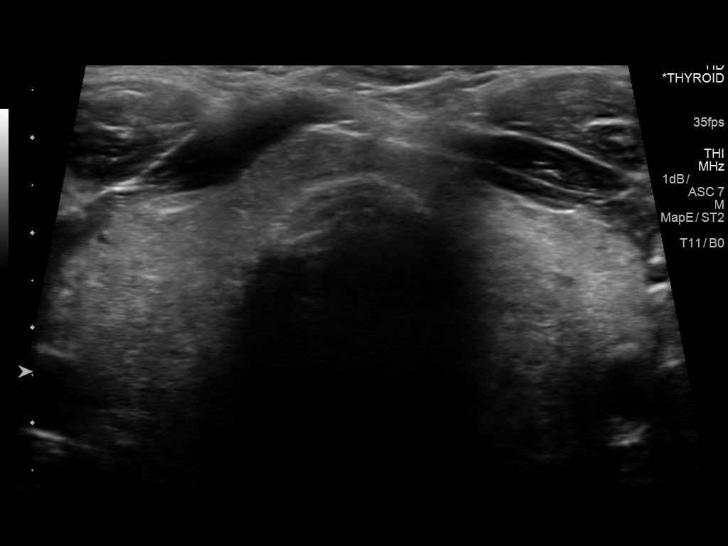
[im 5/54]
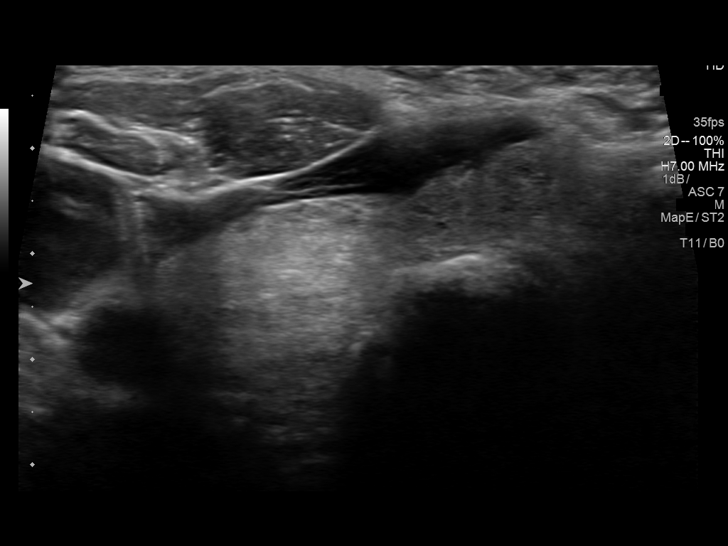
[im 9/54]
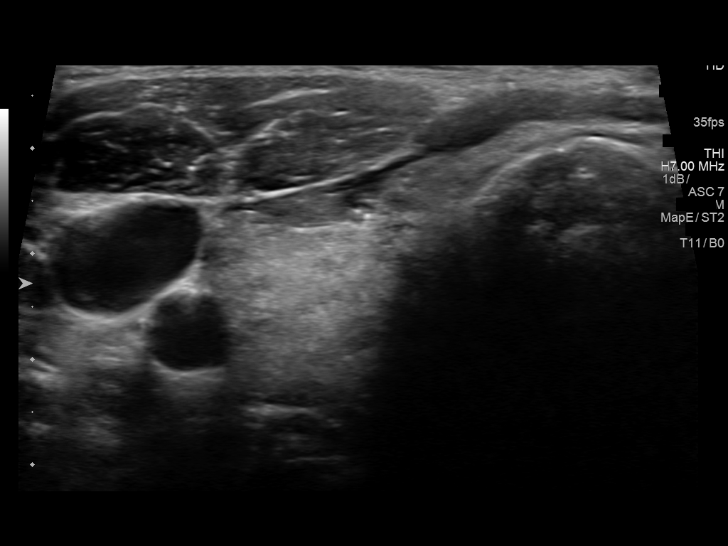
[im 14/54]
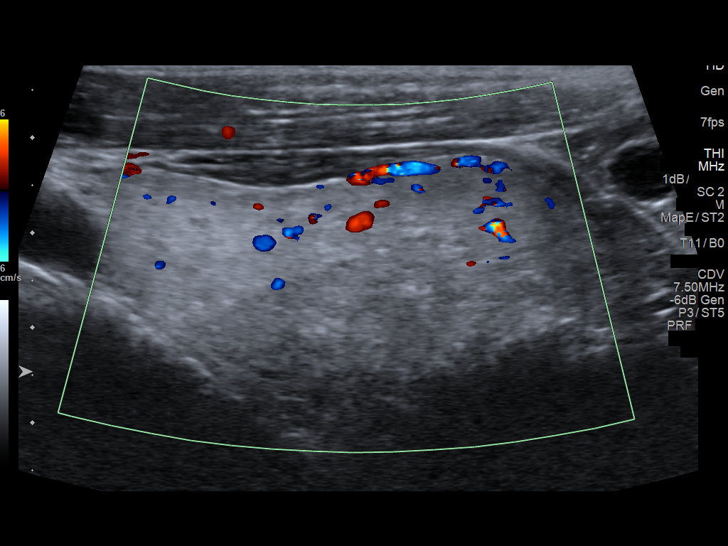
[im 18/54]
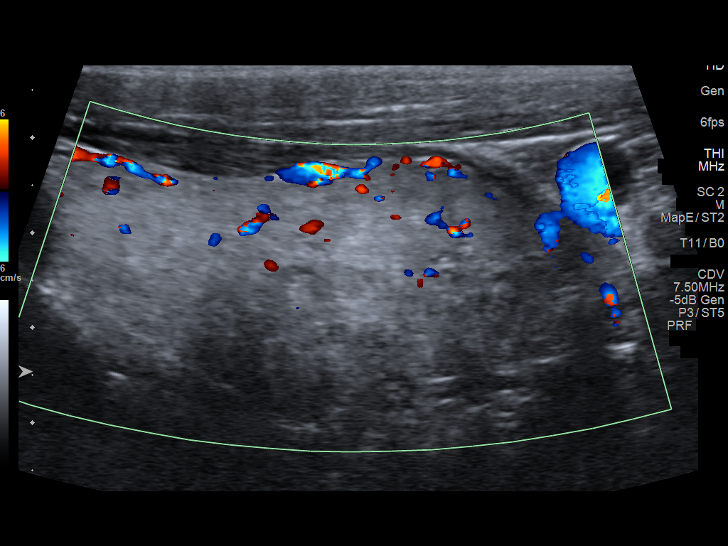
[im 23/54]
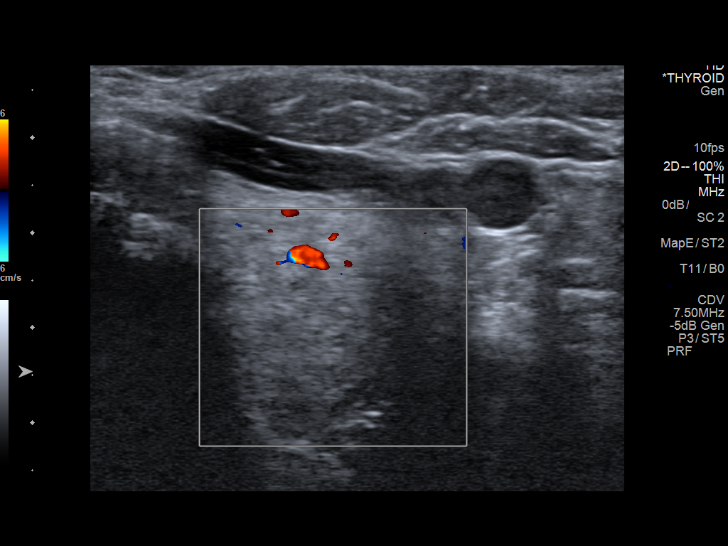
[im 27/54]
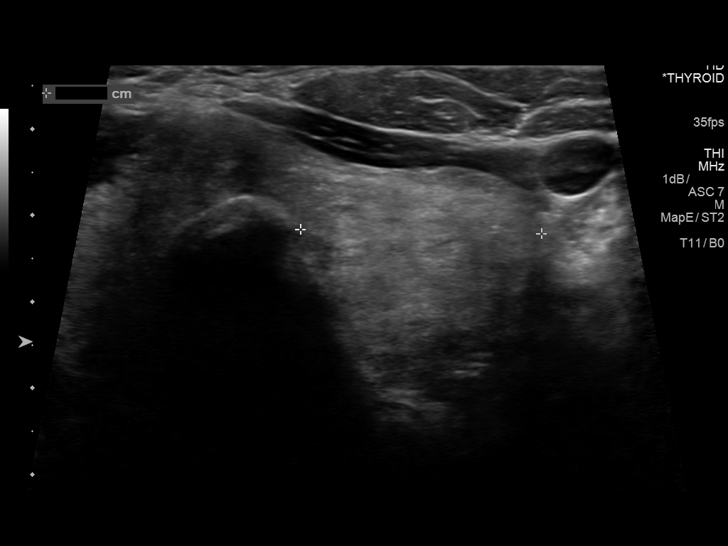
[im 31/54]
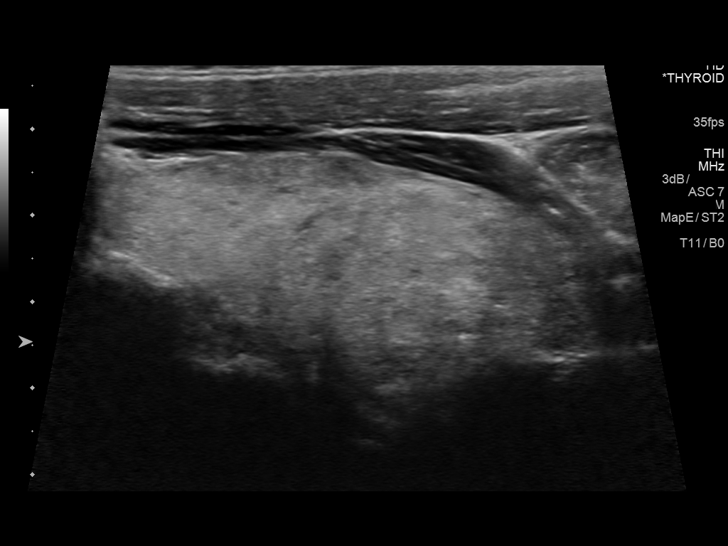
[im 36/54]
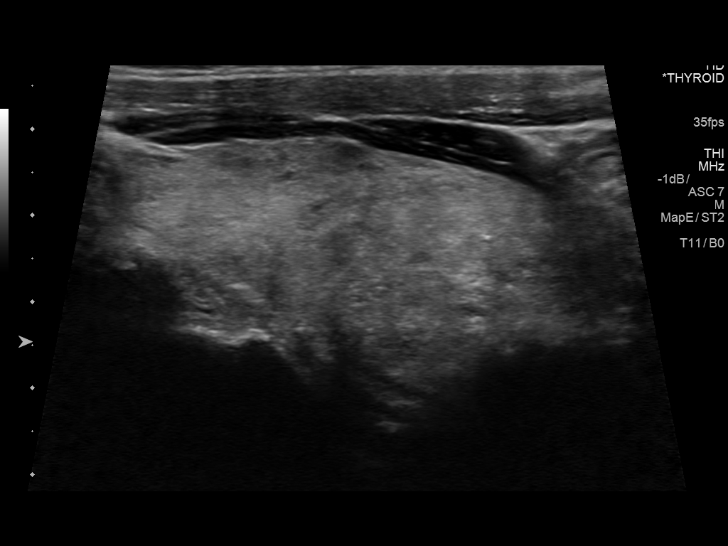
[im 40/54]
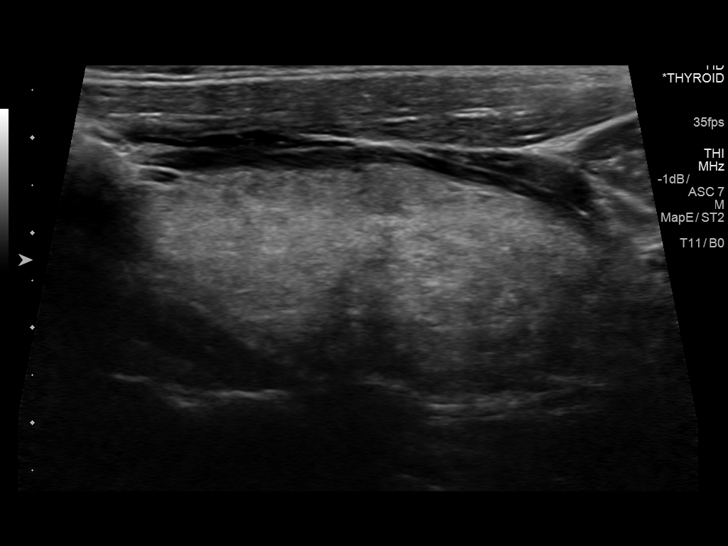
[im 45/54]
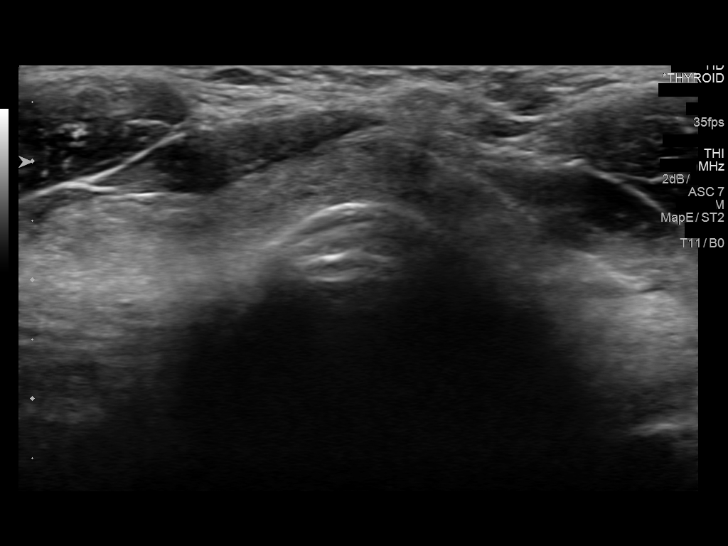
[im 49/54]
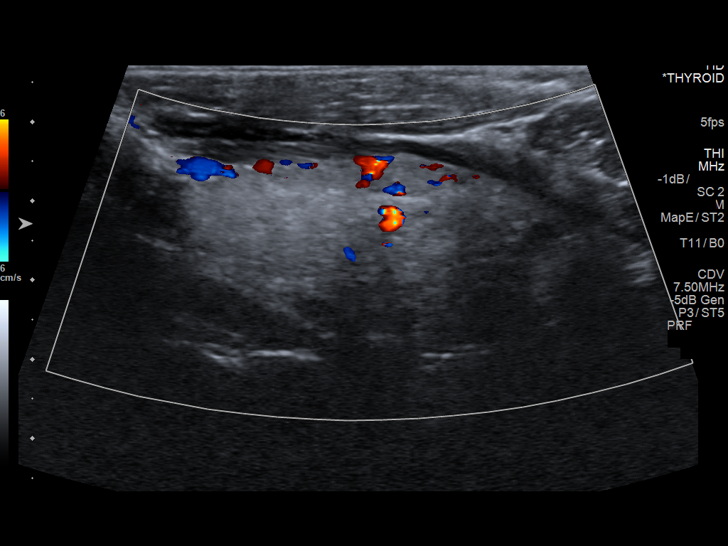
[im 54/54]
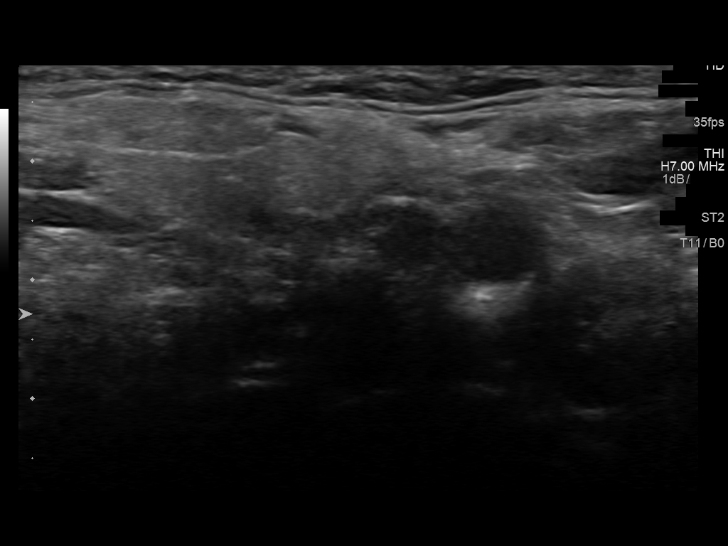

[13 of 25 positions shown; findings below may reference images not displayed]

FINDINGS: Parenchymal Echotexture: Mildly heterogenous

Isthmus: 0.6 cm

Right lobe: 6.2 x 2.9 x 2.6 cm

Left lobe: 6.2 x 2.7 x 2.8 cm

_________________________________________________________

Estimated total number of nodules >/= 1 cm: 1

Number of spongiform nodules >/=  2 cm not described below (TR1): 0

Number of mixed cystic and solid nodules >/= 1.5 cm not described
below (TR2): 0

_________________________________________________________

Nodule # 1:

Location: Left; Inferior

Maximum size: 3.0  cm; Other 2 dimensions: 2.1 x 2.0 cm cm

Composition: solid/almost completely solid (2)

Echogenicity: isoechoic (1)

Shape: not taller-than-wide (0)

Margins: ill-defined (0)

Echogenic foci: none (0)

ACR TI-RADS total points: 3.

ACR TI-RADS risk category: TR3 (3 points).

ACR TI-RADS recommendations:

**Given size (>/= 2.5 cm) and appearance, fine needle aspiration of
this mildly suspicious nodule should be considered based on TI-RADS
criteria.

_________________________________________________________

No discrete right thyroid nodules.  No enlarged lymph nodes.
IMPRESSION: 3.0 cm left thyroid nodule. This is a TR3 lesion and meets criteria
for ultrasound-guided biopsy.

The above is in keeping with the ACR TI-RADS recommendations - [HOSPITAL] 8918;[DATE].

## 2018-01-21 ENCOUNTER — Ambulatory Visit (INDEPENDENT_AMBULATORY_CARE_PROVIDER_SITE_OTHER): Payer: Medicare HMO | Admitting: Family Medicine

## 2018-01-21 ENCOUNTER — Encounter: Payer: Self-pay | Admitting: Family Medicine

## 2018-01-21 VITALS — BP 118/77 | HR 67 | Wt 217.0 lb

## 2018-01-21 DIAGNOSIS — M7711 Lateral epicondylitis, right elbow: Secondary | ICD-10-CM | POA: Insufficient documentation

## 2018-01-21 DIAGNOSIS — E119 Type 2 diabetes mellitus without complications: Secondary | ICD-10-CM

## 2018-01-21 LAB — POCT GLYCOSYLATED HEMOGLOBIN (HGB A1C): HEMOGLOBIN A1C: 6.3

## 2018-01-21 MED ORDER — DICLOFENAC SODIUM 1 % TD GEL: 2.0000 g | Freq: Four times a day (QID) | TRANSDERMAL | 11 refills | Status: DC

## 2018-01-21 NOTE — Progress Notes (Signed)
DM eye exam at the [facility] in [place].

## 2018-01-21 NOTE — Patient Instructions (Addendum)
Thank you for coming in today. Please have the [facility] send me you Diabetic eye exam  For the elbow do the wrist up and down slowly.  Use the Teraband torq sticks. You can get them online or likely at a sports store. They should be advertised for tennis elbow.  Use the a counter force brace is really bad.  Use a wrist brace if miserable.  If not better recheck.  Use the gel up 2 4x daily for pain as needed.   Recheck if all is well in September for well adult visit.    Tennis Elbow Tennis elbow (lateral epicondylitis) is inflammation of the outer tendons of your forearm close to your elbow. Your tendons attach your muscles to your bones. The outer tendons of your forearm are used to extend your wrist, and they attach on the outside part of your elbow. Tennis elbow is often found in people who play tennis, but anyone may get the condition from repeatedly extending the wrist or turning the forearm. What are the causes? This condition is caused by repeatedly extending your wrist and using your hands. It can result from sports or work that requires repetitive forearm movements. Tennis elbow may also be caused by an injury. What increases the risk? You have a higher risk of developing tennis elbow if you play tennis or another racquet sport. You also have a higher risk if you frequently use your hands for work. This condition is also more likely to develop in:  Musicians.  Carpenters, painters, and plumbers.  Cooks.  Cashiers.  People who work in [employer].  Architect workers.  Butchers.  People who use computers.  What are the signs or symptoms? Symptoms of this condition include:  Pain and tenderness in your forearm and the outer part of your elbow. You may only feel the pain when you use your arm, or you may feel it even when you are not using your arm.  A burning feeling that runs from your elbow through your arm.  Weak grip in your hands.  How is this diagnosed? This  condition may be diagnosed by medical history and physical exam. You may also have other tests, including:  X-rays.  MRI.  How is this treated? Your health care provider will recommend lifestyle adjustments, such as resting and icing your arm. Treatment may also include:  Medicines for inflammation. This may include shots of cortisone if your pain continues.  Physical therapy. This may include massage or exercises.  An elbow brace.  Surgery may eventually be recommended if your pain does not go away with treatment. Follow these instructions at home: Activity  Rest your elbow and wrist as directed by your health care provider. Try to avoid any activities that caused the problem until your health care provider says that you can do them again.  If a physical therapist teaches you exercises, do all of them as directed.  If you lift an object, lift it with your palm facing upward. This lowers the stress on your elbow. Lifestyle  If your tennis elbow is caused by sports, check your equipment and make sure that: ? You are using it correctly. ? It is the best fit for you.  If your tennis elbow is caused by work, take breaks frequently, if you are able. Talk with your manager about how to best perform tasks in a way that is safe. ? If your tennis elbow is caused by computer use, talk with your manager about any changes  that can be made to your work environment. General instructions  If directed, apply ice to the painful area: ? Put ice in a plastic bag. ? Place a towel between your skin and the bag. ? Leave the ice on for 20 minutes, 2-3 times per day.  Take medicines only as directed by your health care provider.  If you were given a brace, wear it as directed by your health care provider.  Keep all follow-up visits as directed by your health care provider. This is important. Contact a health care provider if:  Your pain does not get better with treatment.  Your pain gets  worse.  You have numbness or weakness in your forearm, hand, or fingers. This information is not intended to replace advice given to you by your health care provider. Make sure you discuss any questions you have with your health care provider. Document Released: 10/22/2005 Document Revised: 06/21/2016 Document Reviewed: 10/18/2014 Elsevier Interactive Patient Education  [employer].

## 2018-01-21 NOTE — Progress Notes (Signed)
Kevin Wood is a 72 y.o. male who presents to [place]: [place] today for complaints of arm pain.   Arm Pain: The patient states that approximately two weeks ago he began having right arm pain on the lateral side of his upper forearm. The patient states that the pain gets worse throughout the day and with use. He denies any weakness in his arm. He denies numbness or tingling in his arm or hand. He states the pain is localized in one area and does not move. He took one Robaxin pill with no relief, and tried electrical stimulation with no relief.  Pain is worse with extension of wrist. He denies any injury.   The patient notes he is about to ride motorcycles from here to [place] in a few days and is worried about this worsening.   Diabetes: Doing well with diet control.  No hyper or hypoglycemic episodes.  No polyuria or polydipsia.  Past Medical History:  Diagnosis Date  . Elevated blood pressure 12/12/2012  . H/O degenerative disc disease 12/17/2012  . History of nephrolithiasis 12/17/2012  . Hyperlipidemia 12/12/2012  . Lumbar vertebral fracture ([place]) [place]  . Type 2 diabetes mellitus ([place]) 12/17/2012  . Unspecified vitamin D deficiency 12/17/2012   Outside Records    Past Surgical History:  Procedure Laterality Date  . c spine fustion  1994  . colon cancer  father  . GALLBLADDER SURGERY  2012   Social History   Tobacco Use  . Smoking status: Never Smoker  . Smokeless tobacco: Never Used  Substance Use Topics  . Alcohol use: Yes    Alcohol/week: 0.5 oz    Types: 1 Standard drinks or equivalent per week   family history includes Cancer in his father.  ROS as above:  Medications: Current Outpatient Medications  Medication Sig Dispense Refill  . lisinopril-hydrochlorothiazide (PRINZIDE,ZESTORETIC) 20-12.5 MG tablet Take 1 tablet by mouth daily.  90 tablet 3  . methocarbamol (ROBAXIN) 500 MG tablet Take 1 tablet (500 mg total) by mouth every 8 (eight) hours as needed for muscle spasms. 90 tablet 0  . simvastatin (ZOCOR) 40 MG tablet TAKE 1 TABLET EVERY DAY 90 tablet 0  . diclofenac sodium (VOLTAREN) 1 % GEL Apply 2 g topically 4 (four) times daily. To affected joint. 100 g 11   No current facility-administered medications for this visit.    No Known Allergies  Health Maintenance Health Maintenance  Topic Date Due  . OPHTHALMOLOGY EXAM  06/05/2017  . HEMOGLOBIN A1C  01/13/2018  . FOOT EXAM  08/12/2018  . Fecal DNA (Cologuard)  01/01/2019  . TETANUS/TDAP  07/26/2019  . INFLUENZA VACCINE  Completed  . Hepatitis C Screening  Completed  . PNA vac Low Risk Adult  Completed     Exam:  BP 118/77   Pulse 67   Wt 217 lb (98.4 kg)   BMI 31.14 kg/m  Gen: Well NAD .  MSK:  Right elbow:  The right elbow is not erythematous and there is no effusion. ROM intact flexion, extension, supination, and pronation  Strength is 5/5 with arm abduction, flexion, extension, pronation and supination  TTP lateral epicondyl.. Pain with resisted wrist extension located in the lateral epicondyle region Pain with resisted supination located in the lateral epicondyles Pulses capillary refill and sensation intact distally   Lab Results  Component Value Date   HGBA1C 6.3 01/21/2018     Assessment and Plan:  72 y.o. male with right arm pain and diabetes.   Pain the lateral epicondyle with resisted wrist extension is very likely lateral epicondylitis.  However this pain is also present with supination which is not typical.. Plan to treat as though lateral epicondylitis with stretching and eccentric wrist exercises.  Additionally will use diclofenac gel.  Caryl Pina discussed the option of a counter force brace and wrist brace.   The patient is going on a cross country motorcycle trip we discussed backup plan if not better.  Diabetes: Well  controlled.  Continue current regimen.  Recheck in 6 months. Sending records request for diabetic eye exam done at the [facility]  Orders Placed This Encounter  Procedures  . POCT HgB A1C   Meds ordered this encounter  Medications  . diclofenac sodium (VOLTAREN) 1 % GEL    Sig: Apply 2 g topically 4 (four) times daily. To affected joint.    Dispense:  100 g    Refill:  11     Discussed warning signs or symptoms. Please see discharge instructions. Patient expresses understanding.

## 2018-01-30 IMAGING — US US FNA BIOPSY THYROID 1ST LESION
1 series · 13 of 18 positions shown · non-contrast
Comparison: Ultrasound on November 21, 2017

MEDICATIONS:
1% lidocaine

COMPLICATIONS:
None immediate.

INDICATION: Indeterminate thyroid nodule

EXAM:
ULTRASOUND GUIDED FINE NEEDLE ASPIRATION OF INDETERMINATE THYROID
NODULE
TECHNIQUE: Informed written consent was obtained from the patient after a
discussion of the risks, benefits and alternatives to treatment.
Questions regarding the procedure were encouraged and answered. A
timeout was performed prior to the initiation of the procedure.

[Series 1: us fna biopsy thyroid 1st lesion · 0.07mm/px · 18 acquisitions, 13 frames shown]
[im 1/18]
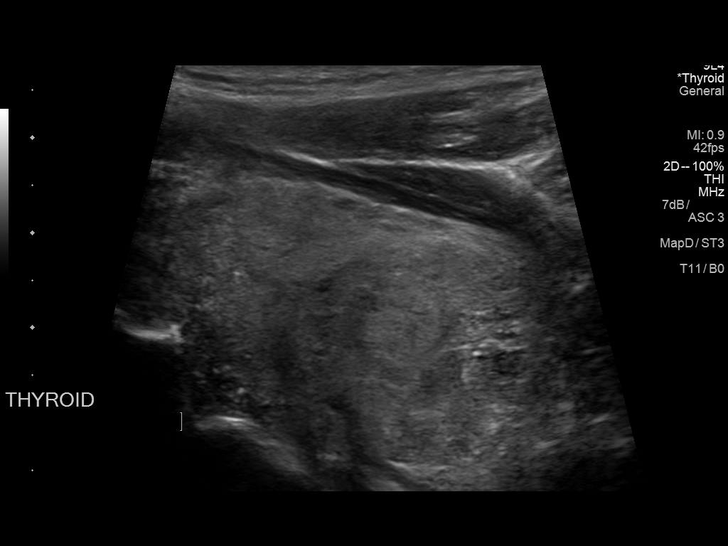
[im 3/18]
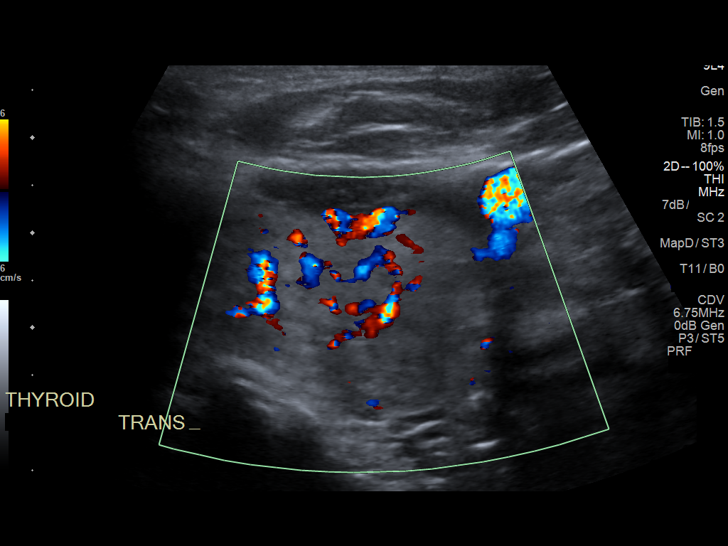
[im 4/18]
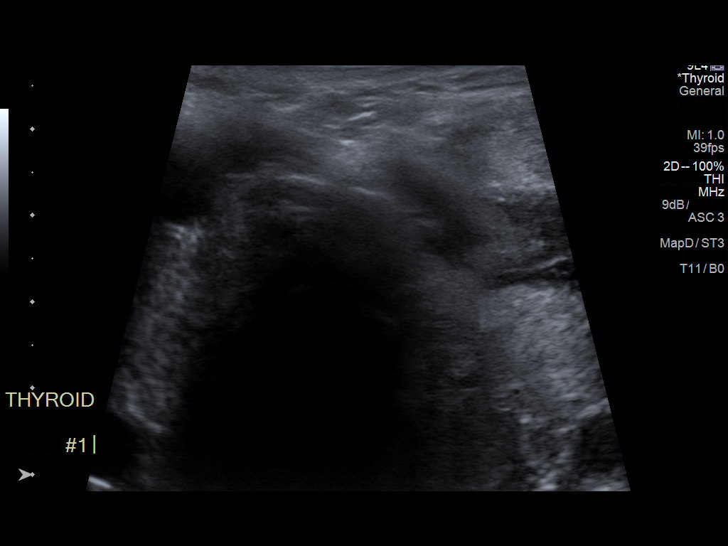
[im 5/18]
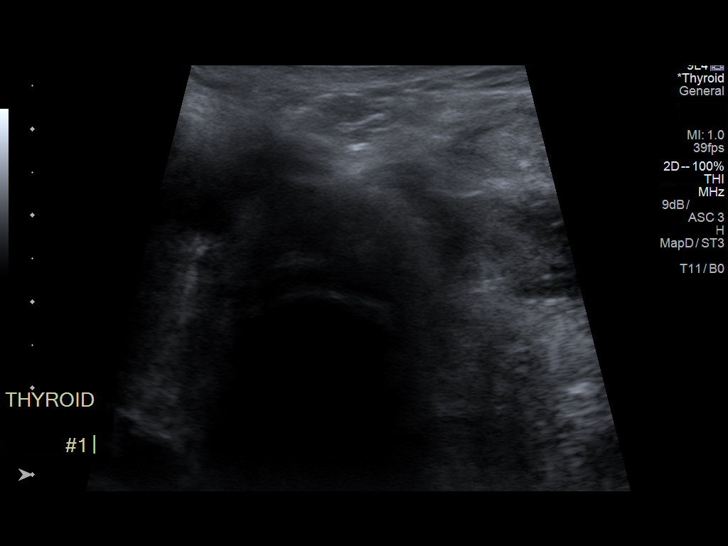
[im 7/18]
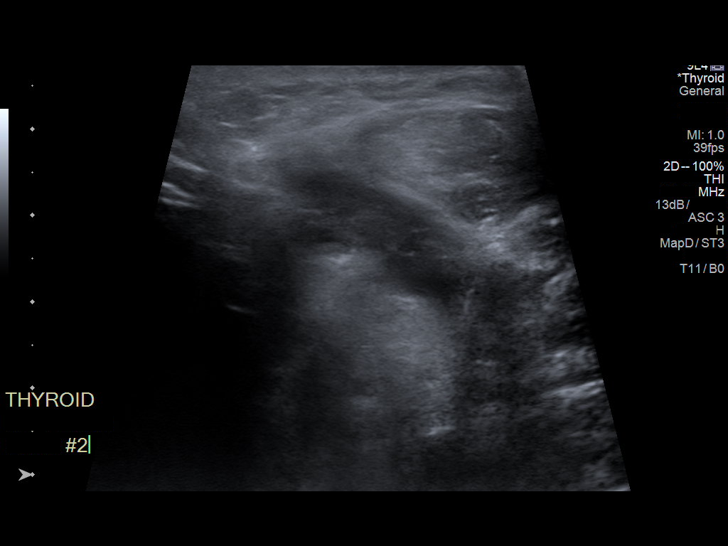
[im 8/18]
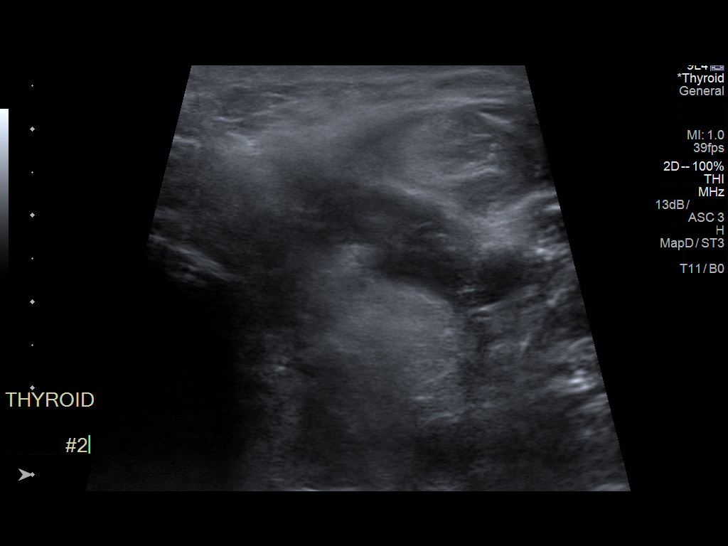
[im 10/18]
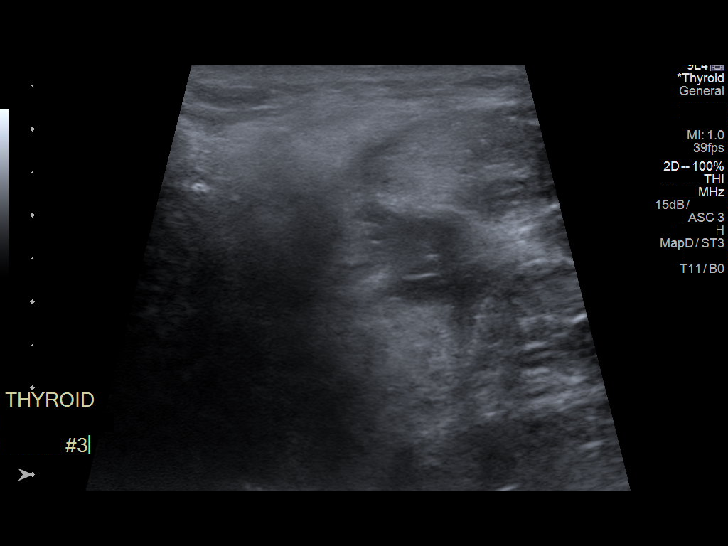
[im 11/18]
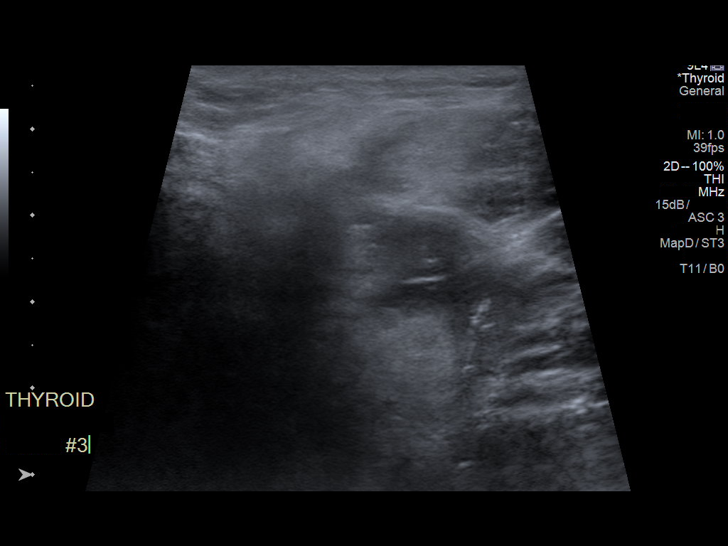
[im 12/18]
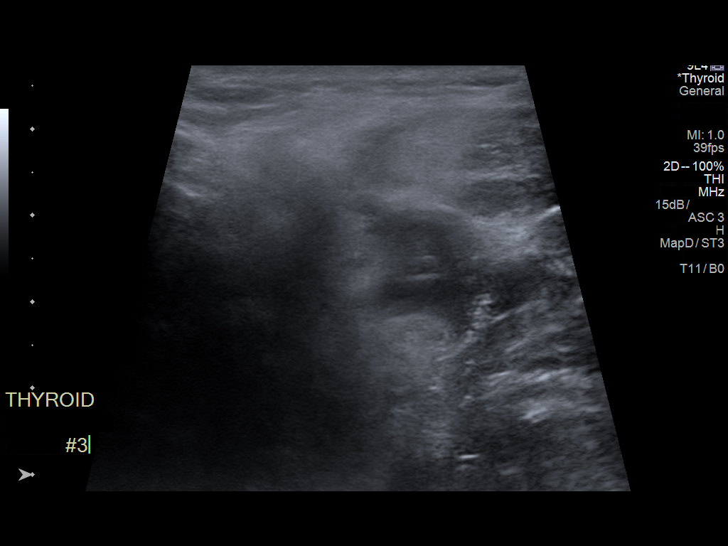
[im 14/18]
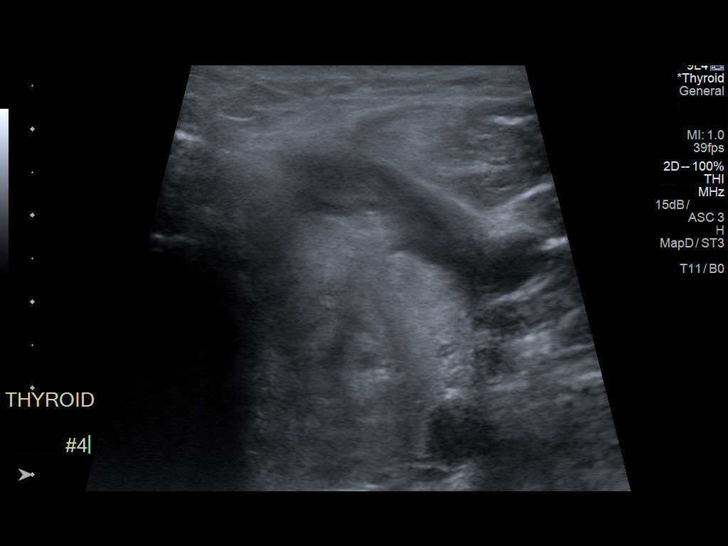
[im 15/18]
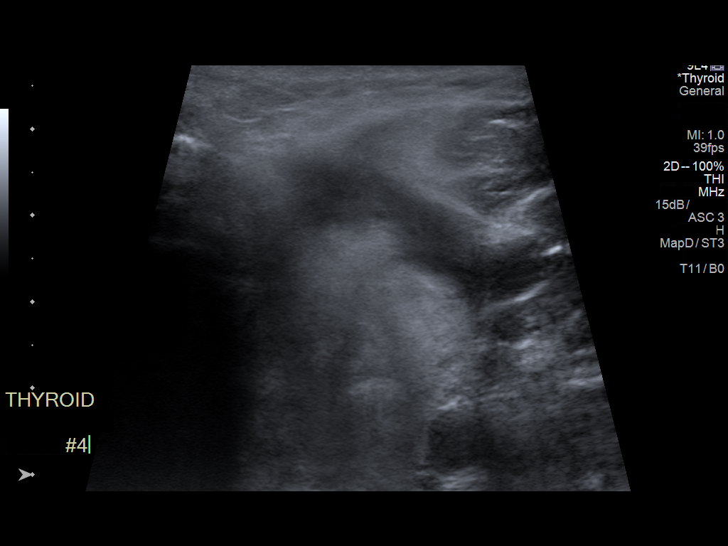
[im 16/18]
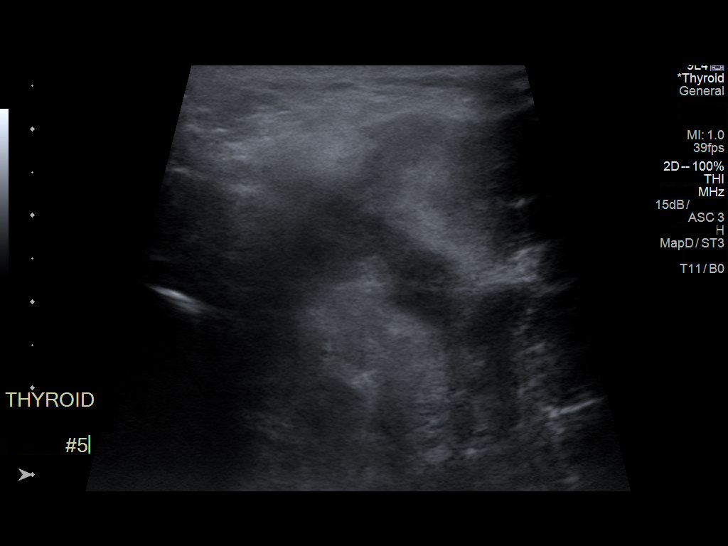
[im 18/18]
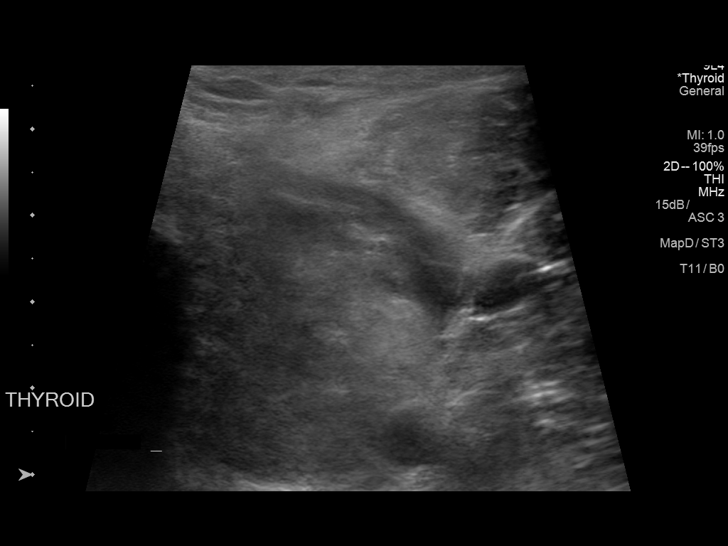

[13 of 18 positions shown; findings below may reference images not displayed]

Pre-procedural ultrasound scanning demonstrated unchanged size and
appearance of the indeterminate nodule within the left inferior
thyroid.

The procedure was planned. The neck was prepped in the usual sterile
fashion, and a sterile drape was applied covering the operative
field. A timeout was performed prior to the initiation of the
procedure. Local anesthesia was provided with 1% lidocaine.

Under direct ultrasound guidance, 3 FNA biopsies were performed of
the left inferior nodule with a 25 gauge needle. Due to minimal
sample, 2 more biopsies were done with a 22 gauge needle. Multiple
ultrasound images were saved for procedural documentation purposes.
The samples were prepared and submitted to pathology.

Limited post procedural scanning was negative for hematoma or
additional complication. Dressings were placed. The patient
tolerated the above procedures procedure well without immediate
postprocedural complication.
FINDINGS: Nodule reference number based on prior diagnostic ultrasound: 1

Maximum size: 3.0 cm

Location: Left; Inferior

ACR TI-RADS risk category: TR3 (3 points)

Reason for biopsy: meets ACR TI-RADS criteria

Ultrasound imaging confirms appropriate placement of the needles
within the thyroid nodule.
IMPRESSION: Technically successful ultrasound guided fine needle aspiration of
nodule 1 within the left inferior aspect of the thyroid.

## 2018-02-04 ENCOUNTER — Encounter: Payer: Self-pay | Admitting: Family Medicine

## 2018-04-02 ENCOUNTER — Encounter: Payer: Self-pay | Admitting: Family Medicine

## 2018-04-06 ENCOUNTER — Other Ambulatory Visit: Payer: Self-pay | Admitting: Family Medicine

## 2018-04-14 LAB — CBC AND DIFFERENTIAL
HCT: 45 (ref 41–53)
HCT: 45 (ref 41–53)
Hemoglobin: 16 (ref 13.5–17.5)
Platelets: 159 (ref 150–399)
WBC: 3.7

## 2018-04-14 LAB — MICROALBUMIN, URINE: Microalb, Ur: 2.23

## 2018-04-14 LAB — BASIC METABOLIC PANEL
BUN: 15 (ref 4–21)
Creatinine: 1.1 (ref 0.6–1.3)
GLUCOSE: 125
POTASSIUM: 3.8 (ref 3.4–5.3)
SODIUM: 141 (ref 137–147)

## 2018-04-14 LAB — VITAMIN D 25 HYDROXY (VIT D DEFICIENCY, FRACTURES): Vit D, 25-Hydroxy: 44.06

## 2018-04-14 LAB — HEPATIC FUNCTION PANEL
ALT: 30 (ref 10–40)
AST: 16 (ref 14–40)
Alkaline Phosphatase: 86 (ref 25–125)
BILIRUBIN, TOTAL: 0.2

## 2018-04-14 LAB — LIPID PANEL
HDL: 44 (ref 35–70)
LDL Cholesterol: 82
Triglycerides: 76 (ref 40–160)

## 2018-04-14 LAB — HEMOGLOBIN A1C: Hemoglobin A1C: 6.6

## 2018-04-18 ENCOUNTER — Encounter: Payer: Self-pay | Admitting: Family Medicine

## 2018-04-18 NOTE — Progress Notes (Signed)
Labs abstracted from [facility] Not significantly changed.  A1c 6.6.  LDL 82 Urine microalbumin negative

## 2018-07-17 ENCOUNTER — Ambulatory Visit (INDEPENDENT_AMBULATORY_CARE_PROVIDER_SITE_OTHER): Payer: Medicare HMO | Admitting: Family Medicine

## 2018-07-17 ENCOUNTER — Encounter: Payer: Self-pay | Admitting: Family Medicine

## 2018-07-17 VITALS — BP 118/77 | HR 64 | Ht 71.0 in | Wt 221.0 lb

## 2018-07-17 DIAGNOSIS — S39012A Strain of muscle, fascia and tendon of lower back, initial encounter: Secondary | ICD-10-CM | POA: Diagnosis not present

## 2018-07-17 DIAGNOSIS — E119 Type 2 diabetes mellitus without complications: Secondary | ICD-10-CM | POA: Diagnosis not present

## 2018-07-17 DIAGNOSIS — M9261 Juvenile osteochondrosis of tarsus, right ankle: Secondary | ICD-10-CM | POA: Diagnosis not present

## 2018-07-17 NOTE — Progress Notes (Signed)
Kevin Wood is a 72 y.o. male who presents to [place]: [place] today for back pain, right heel pain, diabetes.  Back pain.  Kevin Wood notes a 3 week history of right low back pain.  Pain is located in the right lower back worse with activity and when he lays down at night.  He denies any radiating pain weakness or numbness fevers or chills.  He denies any injury.  He is tried some over-the-counter medications which helped a bit.  He feels well otherwise with no fevers chills nausea vomiting or diarrhea.  Additionally he notes swelling in the right posterior heel.  This is been ongoing for months and is not particularly painful.  He notes that he wear shoes with a hard plastic heel cup it will be quite painful.  Sometimes he gets more swollen but for the most part it is not particularly bothersome.  He denies any injury and has not had any significant change in symptoms.  Has not tried much yet either.   Diabetes: Kevin Wood has a history of diet-controlled diabetes.  He eats a careful diet and typically does quite well.  His last A1c was 6.6 when checked at the [facility].  He denies any polyuria or polydipsia.  He notes that he will get his diabetic eye exam next month when he gets checked  at the [facility].  ROS as above:  Exam:  BP 118/77   Pulse 64   Ht 5\' 11"  (1.803 m)   Wt 221 lb (100.2 kg)   BMI 30.82 kg/m  Wt Readings from Last 5 Encounters:  07/17/18 221 lb (100.2 kg)  01/21/18 217 lb (98.4 kg)  11/13/17 224 lb (101.6 kg)  08/12/17 218 lb (98.9 kg)  07/16/17 220 lb (99.8 kg)    Gen: Well NAD HEENT: EOMI,  MMM Lungs: Normal work of breathing. CTABL Heart: RRR no MRG Abd: NABS, Soft. Nondistended, Nontender Exts: Brisk capillary refill, warm and well perfused.  L-spine: Nontender to spinal midline.  Tender palpation right lumbar paraspinal muscle group.  Normal back motion.  Pain  with right rotation and right lateral flexion.  Lower extreme strength reflexes and sensation are equal normal throughout.  Right posterior calcaneus slight swelling at the insertion of the Achilles tendon.  Not particularly tender.  Normal ankle motion and strength.  Lab and Radiology Results Lab Results  Component Value Date   HGBA1C 6.6 04/14/2018      Assessment and Plan: 72 y.o. male with  Back pain: Likely myofascial strain and spasm.  Plan for home exercise program TENS unit and methocarbamol.  Additionally use heating pad.  If not improving next step would be formal physical therapy and/or x-ray.  Heel swelling: Haglund deformity.  Discussed eccentric exercises for Achilles tendinitis.  Also discussed shoe wear and heel cups.  Recheck as needed.  Diabetes: Typically quite well controlled.  Plan for hemoglobin A1c.  Patient will defer flu vaccine today noting that he will get it next month at the [facility].  He will send records when completed.   Orders Placed This Encounter  Procedures  . Hemoglobin A1c   No orders of the defined types were placed in this encounter.   [facility] Kevin Sam[facility] MD Phone: [phone]; Fax: [phone] Historical information moved to improve visibility of documentation.  Past Medical History:  Diagnosis Date  . Elevated blood pressure 12/12/2012  . H/O degenerative disc disease 12/17/2012  . History of  nephrolithiasis 12/17/2012  . Hyperlipidemia 12/12/2012  . Lumbar vertebral fracture ([place]) [place]  . Type 2 diabetes mellitus ([place]) 12/17/2012  . Unspecified vitamin D deficiency 12/17/2012   Outside Records    Past Surgical History:  Procedure Laterality Date  . c spine fustion  1994  . colon cancer  father  . GALLBLADDER SURGERY  2012   Social History   Tobacco Use  . Smoking status: Never Smoker  . Smokeless tobacco: Never Used  Substance Use Topics  . Alcohol use: Yes    Alcohol/week: 1.0 standard drinks     Types: 1 Standard drinks or equivalent per week   family history includes Cancer in his father.  Medications: Current Outpatient Medications  Medication Sig Dispense Refill  . diclofenac sodium (VOLTAREN) 1 % GEL Apply 2 g topically 4 (four) times daily. To affected joint. 100 g 11  . lisinopril-hydrochlorothiazide (PRINZIDE,ZESTORETIC) 20-12.5 MG tablet Take 1 tablet by mouth daily. 90 tablet 3  . methocarbamol (ROBAXIN) 500 MG tablet Take 1 tablet (500 mg total) by mouth every 8 (eight) hours as needed for muscle spasms. 90 tablet 0  . simvastatin (ZOCOR) 40 MG tablet TAKE 1 TABLET EVERY DAY 90 tablet 0   No current facility-administered medications for this visit.    No Known Allergies   Discussed warning signs or symptoms. Please see discharge instructions. Patient expresses understanding.

## 2018-07-17 NOTE — Patient Instructions (Addendum)
Thank you for coming in today. [facility].  Talk to someone like an attorney about medicaid spend-down and protecting your finances.   Recheck in 5 months.  Return sooner if needed.   Haglund deformity heel.  Work on the heel exercises. Go down slowly.   Let me know when you get the flu shot.   Do the home exercises for back pain.   Come back or go to the emergency room if you notice new weakness new numbness problems walking or bowel or bladder problems.   Lumbosacral Strain Lumbosacral strain is an injury that causes pain in the lower back (lumbosacral spine). This injury usually occurs from overstretching the muscles or ligaments along your spine. A strain can affect one or more muscles or cord-like tissues that connect bones to other bones (ligaments). What are the causes? This condition may be caused by:  A hard, direct hit (blow) to the back.  Excessive stretching of the lower back muscles. This may result from: ? A fall. ? Lifting something heavy. ? Repetitive movements such as bending or crouching.  What increases the risk? The following factors may increase your risk of getting this condition:  Participating in sports or activities that involve: ? A sudden twist of the back. ? Pushing or pulling motions.  Being overweight or obese.  Having poor strength and flexibility, especially tight hamstrings or weak muscles in the back or abdomen.  Having too much of a curve in the lower back.  Having a pelvis that is tilted forward.  What are the signs or symptoms? The main symptom of this condition is pain in the lower back, at the site of the strain. Pain may extend (radiate) down one or both legs. How is this diagnosed? This condition is diagnosed based on:  Your symptoms.  Your medical history.  A physical exam. ? Your health care provider may push on certain areas of your back to determine the source of your pain. ? You may be asked to bend  forward, backward, and side to side to assess the severity of your pain and your range of motion.  Imaging tests, such as: ? X-rays. ? MRI.  How is this treated? Treatment for this condition may include:  Putting heat and cold on the affected area.  Medicines to help relieve pain and relax your muscles (muscle relaxants).  NSAIDs to help reduce swelling and discomfort.  When your symptoms improve, it is important to gradually return to your normal routine as soon as possible to reduce pain, avoid stiffness, and avoid loss of muscle strength. Generally, symptoms should improve within 6 weeks of treatment. However, recovery time varies. Follow these instructions at home: Managing pain, stiffness, and swelling   If directed, put ice on the injured area during the first 24 hours after your strain. ? Put ice in a plastic bag. ? Place a towel between your skin and the bag. ? Leave the ice on for 20 minutes, 2-3 times a day.  If directed, put heat on the affected area as often as told by your health care provider. Use the heat source that your health care provider recommends, such as a moist heat pack or a heating pad. ? Place a towel between your skin and the heat source. ? Leave the heat on for 20-30 minutes. ? Remove the heat if your skin turns bright red. This is especially important if you are unable to feel pain, heat, or cold. You may have a greater  risk of getting burned. Activity  Rest and return to your normal activities as told by your health care provider. Ask your health care provider what activities are safe for you.  Avoid activities that take a lot of energy for as long as told by your health care provider. General instructions  Take over-the-counter and prescription medicines only as told by your health care provider.  Donot drive or use heavy machinery while taking prescription pain medicine.  Do not use any products that contain nicotine or tobacco, such as  cigarettes and e-cigarettes. If you need help quitting, ask your health care provider.  Keep all follow-up visits as told by your health care provider. This is important. How is this prevented?  Use correct form when playing sports and lifting heavy objects.  Use good posture when sitting and standing.  Maintain a healthy weight.  Sleep on a mattress with medium firmness to support your back.  Be safe and responsible while being active to avoid falls.  Do at least 150 minutes of moderate-intensity exercise each week, such as brisk walking or water aerobics. Try a form of exercise that takes stress off your back, such as swimming or stationary cycling.  Maintain physical fitness, including: ? Strength. ? Flexibility. ? Cardiovascular fitness. ? Endurance. Contact a health care provider if:  Your back pain does not improve after 6 weeks of treatment.  Your symptoms get worse. Get help right away if:  Your back pain is severe.  You cannot stand or walk.  You have difficulty controlling when you urinate or when you have a bowel movement.  You feel nauseous or you vomit.  Your feet get very cold.  You have numbness, tingling, weakness, or problems using your arms or legs.  You develop any of the following: ? Shortness of breath. ? Dizziness. ? Pain in your legs. ? Weakness in your buttocks or legs. ? Discoloration of the skin on your toes or legs. This information is not intended to replace advice given to you by your health care provider. Make sure you discuss any questions you have with your health care provider. Document Released: 08/01/2005 Document Revised: 05/11/2016 Document Reviewed: 03/25/2016 Elsevier Interactive Patient Education  2018 [employer]   TENS UNIT: This is helpful for muscle pain and spasm.   Search and Purchase a TENS 7000 2nd edition at  www.tenspros.com or www.[employer].com It should be less than $30.     TENS unit instructions: Do not  shower or bathe with the unit on Turn the unit off before removing electrodes or batteries If the electrodes lose stickiness add a drop of water to the electrodes after they are disconnected from the unit and place on plastic sheet. If you continued to have difficulty, call the TENS unit company to purchase more electrodes. Do not apply lotion on the skin area prior to use. Make sure the skin is clean and dry as this will help prolong the life of the electrodes. After use, always check skin for unusual red areas, rash or other skin difficulties. If there are any skin problems, does not apply electrodes to the same area. Never remove the electrodes from the unit by pulling the wires. Do not use the TENS unit or electrodes other than as directed. Do not change electrode placement without consultating your therapist or physician. Keep 2 fingers with between each electrode. Wear time ratio is 2:1, on to off times.    For example on for 30 minutes off for 15  minutes and then on for 30 minutes off for 15 minutes

## 2018-07-21 DIAGNOSIS — E119 Type 2 diabetes mellitus without complications: Secondary | ICD-10-CM | POA: Diagnosis not present

## 2018-07-22 LAB — HEMOGLOBIN A1C
HEMOGLOBIN A1C: 6.9 %{Hb} — AB (ref <5.7)
MEAN PLASMA GLUCOSE: 151 (calc)
eAG (mmol/L): 8.4 (calc)

## 2018-07-24 ENCOUNTER — Ambulatory Visit (INDEPENDENT_AMBULATORY_CARE_PROVIDER_SITE_OTHER): Payer: Medicare HMO | Admitting: Family Medicine

## 2018-07-24 ENCOUNTER — Encounter: Payer: Self-pay | Admitting: Family Medicine

## 2018-07-24 VITALS — BP 102/63 | HR 66 | Ht 71.0 in | Wt 226.0 lb

## 2018-07-24 DIAGNOSIS — Z66 Do not resuscitate: Secondary | ICD-10-CM | POA: Diagnosis not present

## 2018-07-24 DIAGNOSIS — Z0000 Encounter for general adult medical examination without abnormal findings: Secondary | ICD-10-CM | POA: Diagnosis not present

## 2018-07-24 NOTE — Patient Instructions (Addendum)
Thank you for coming in today. Continue current treatment.  Recheck every 6 months or sooner if needed.   Return sooner if needed.

## 2018-07-24 NOTE — Progress Notes (Signed)
HPI: Kevin Wood is a 72 y.o. male  who presents to [place] today, 07/24/18,  for Medicare Annual Wellness Exam  Patient presents for annual physical/Medicare wellness exam. no complaints today.   Past medical, surgical, social and family history reviewed:  Patient Active Problem List   Diagnosis Date Noted  . 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 Lateral epicondylitis of right elbow 01/21/2018  . Thyroid nodule 11/16/2017  . Carotid arterial disease ([place]) 11/14/2017  . Seborrheic keratoses 11/07/2016  . Acute medial meniscus tear of left knee 05/15/2016  . Internal hemorrhoids 04/12/2015  . 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 Essential hypertension 08/11/2013  . Controlled type 2 diabetes mellitus without complication, without long-term current use of insulin ([place]) 12/17/2012  . History of nephrolithiasis 12/17/2012  . Vitamin D deficiency 12/17/2012  . Excessive cerumen in ear canal 12/17/2012  . H/O degenerative disc disease 12/17/2012  . 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 Family history of colon cancer 12/17/2012  . Hyperlipidemia 12/12/2012    Past Surgical History:  Procedure Laterality Date  . c spine fustion  1994  . colon cancer  father  . GALLBLADDER SURGERY  2012    Social History   Socioeconomic History  . Marital status: Married    Spouse name: Not on file  . Number of children: Not on file  . Years of education: Not on file  . Highest education level: Not on file  Occupational History  . Not on file  Social Needs  . Financial resource strain: Not on file  . Food insecurity:    Worry: Not on file    Inability: Not on file  . Transportation needs:    Medical: Not on file    Non-medical: Not on file  Tobacco Use  . Smoking status: Never Smoker  . Smokeless tobacco: Never Used  Substance and Sexual Activity  . Alcohol use: Yes    Alcohol/week: 1.0 standard drinks     Types: 1 Standard drinks or equivalent per week  . Drug use: No  . Sexual activity: Yes  Lifestyle  . Physical activity:    Days per week: Not on file    Minutes per session: Not on file  . Stress: Not on file  Relationships  . Social connections:    Talks on phone: Not on file    Gets together: Not on file    Attends religious service: Not on file    Active member of club or organization: Not on file    Attends meetings of clubs or organizations: Not on file    Relationship status: Not on file  . Intimate partner violence:    Fear of current or ex partner: Not on file    Emotionally abused: Not on file    Physically abused: Not on file    Forced sexual activity: Not on file  Other Topics Concern  . Not on file  Social History Narrative  . Not on file    Family History  Problem Relation Age of Onset  . Cancer Father        colon cancer     Current medication list and allergy/intolerance information reviewed:    Outpatient Encounter Medications as of 07/24/2018  Medication Sig  . lisinopril-hydrochlorothiazide (PRINZIDE,ZESTORETIC) 20-12.5 MG tablet Take 1 tablet by mouth daily.  . methocarbamol (ROBAXIN) 500 MG tablet Take 1 tablet (500 mg total) by mouth every 8 (eight) hours as needed for  muscle spasms.  . simvastatin (ZOCOR) 40 MG tablet TAKE 1 TABLET EVERY DAY  . [DISCONTINUED] diclofenac sodium (VOLTAREN) 1 % GEL Apply 2 g topically 4 (four) times daily. To affected joint.   No facility-administered encounter medications on file as of 07/24/2018.     No Known Allergies     Review of Systems: No headache, visual changes, nausea, vomiting, diarrhea, constipation, dizziness, abdominal pain, skin rash, fevers, chills, night sweats, weight loss, swollen lymph nodes, body aches, joint swelling, muscle aches, chest pain, shortness of breath, mood changes, visual or auditory hallucinations.     Medicare Wellness Questionnaire  Are there smokers in your home (other  than you)? no  Depression screen [facility] 2/9 07/17/2018 07/16/2017 07/12/2016 03/03/2015 01/25/2014  Decreased Interest 0 0 0 0 0  Down, Depressed, Hopeless 0 0 0 0 0  PHQ - 2 Score 0 0 0 0 0        Activities of Daily Living In your present state of health, do you have any difficulty performing the following activities?:  Driving? no Managing money?  no Feeding yourself? no Getting from bed to chair? no Climbing a flight of stairs? no Preparing food and eating?: no Bathing or showering? no Getting dressed: no Getting to the toilet? no Using the toilet: no Moving around from place to place: no In the past year have you fallen or had a near fall?: no  Hearing Difficulties:  Do you often ask people to speak up or repeat themselves? no Do you experience ringing or noises in your ears? yes  Do you have difficulty understanding soft or whispered voices? no  Memory Difficulties:  Do you feel that you have a problem with memory? no  Do you often misplace items? no  Do you feel safe at home?  yes     Risk Factors  Current exercise habits: walking  Dietary issues discussed:yes  Cardiac risk factors: HTN, HLD    Exam:  BP 102/63   Pulse 66   Ht 5\' 11"  (1.803 m)   Wt 226 lb (102.5 kg)   BMI 31.52 kg/m  Vision by Snellen chart: right eye:see nurse notes, left eye:see nurse notes  Constitutional: VS see above. General Appearance: alert, well-developed, well-nourished, NAD  Ears, Nose, Mouth, Throat: MMM  Neck: No masses, trachea midline.   Respiratory: Normal respiratory effort. no wheeze, no rhonchi, no rales  Cardiovascular:No lower extremity edema.   Musculoskeletal: Gait normal. No clubbing/cyanosis of digits.   Neurological: Normal balance/coordination. No tremor. Recalls 3 objects and able to read face of watch with correct time.   Skin: warm, dry, intact. No rash/ulcer.   Psychiatric: Normal judgment/insight. Normal mood and affect. Oriented x3.      ASSESSMENT/PLAN:   Encounter for Medicare annual wellness exam  Advanced directive already filled out at home.  Patient will bring a copy.  Confirm DNR status.  Health maintenance up-to-date.  Health Maintenance Health Maintenance  Topic Date Due  . INFLUENZA VACCINE  03/06/2019 (Originally 06/05/2018)  . OPHTHALMOLOGY EXAM  08/27/2018  . Fecal DNA (Cologuard)  01/01/2019  . HEMOGLOBIN A1C  01/19/2019  . FOOT EXAM  07/18/2019  . TETANUS/TDAP  07/26/2019  . Hepatitis C Screening  Completed  . PNA vac Low Risk Adult  Completed    Immunization History  Administered Date(s) Administered  . Influenza, High Dose Seasonal PF 07/12/2016, 07/16/2017  . Influenza,inj,Quad PF,6+ Mos 07/02/2013  . Influenza-Unspecified 10/11/2011, 08/20/2015, 07/24/2018  . PPD Test 12/01/2014  . Pneumococcal  Conjugate-13 03/03/2015  . Pneumococcal Polysaccharide-23 06/05/2016  . Tdap 07/25/2009  . Zoster 08/24/2013  . Zoster Recombinat (Shingrix) 08/15/2017, 11/28/2017     During the course of the visit the patient was educated and counseled about appropriate screening and preventive services as noted above.   Patient Instructions (the written plan) was given to the patient.  Medicare Attestation I have personally reviewed: The patient's medical and social history Their use of alcohol, tobacco or illicit drugs Their current medications and supplements The patient's functional ability including ADLs,fall risks, home safety risks, cognitive, and hearing and visual impairment Diet and physical activities Evidence for depression or mood disorders  The patient's weight, height, BMI, and visual acuity have been recorded in the chart.  I have made referrals, counseling, and provided education to the patient based on review of the above and I have provided the patient with a written personalized care plan for preventive services.

## 2018-07-28 ENCOUNTER — Ambulatory Visit: Payer: Medicare HMO | Admitting: Osteopathic Medicine

## 2018-08-11 ENCOUNTER — Other Ambulatory Visit: Payer: Self-pay | Admitting: Family Medicine

## 2018-08-22 LAB — HM DIABETES EYE EXAM

## 2018-09-11 ENCOUNTER — Telehealth: Payer: Self-pay | Admitting: Family Medicine

## 2018-09-11 NOTE — Telephone Encounter (Signed)
Diabetic eye exam results received from [place].  No retinopathy.  Date of service was August 26, 2018.  Results will be sent to scan.

## 2018-11-06 ENCOUNTER — Encounter: Payer: Self-pay | Admitting: Family Medicine

## 2018-11-18 ENCOUNTER — Other Ambulatory Visit: Payer: Self-pay

## 2018-11-18 MED ORDER — SIMVASTATIN 40 MG PO TABS: 40.0000 mg | ORAL_TABLET | Freq: Every day | ORAL | 3 refills | Status: DC

## 2018-12-08 ENCOUNTER — Telehealth: Payer: Self-pay

## 2018-12-08 NOTE — Telephone Encounter (Signed)
Patient called wanted to know if you received any records regarding his colonoscopy done by his previous provider who done it. Please advise patient stated that he has an appointment with the [facility]  today and would like to know if those records were received so that he could share with their office. Rhonda Cunningham,CMA

## 2018-12-09 NOTE — Telephone Encounter (Signed)
Patient had it done back about 10 years he said but I found a copy of the report scanned in the patient chart and he is going to come by the office and pick it up and take it to the [place] doctor who is supposed to be doing his coloncopy. Please advise. Rhonda Cunningham,CMA

## 2018-12-09 NOTE — Telephone Encounter (Signed)
I have not received a report regarding colonoscopy recently.  Where and when did you have this test done?

## 2018-12-17 LAB — HM COLONOSCOPY

## 2018-12-23 ENCOUNTER — Telehealth: Payer: Self-pay | Admitting: Family Medicine

## 2018-12-23 NOTE — Telephone Encounter (Signed)
Received colonoscopy report from the [place].  Date of service December 17, 2018.  Plan for repeat colonoscopy in 3 years.

## 2018-12-25 ENCOUNTER — Encounter: Payer: Self-pay | Admitting: Family Medicine

## 2018-12-31 ENCOUNTER — Encounter: Payer: Self-pay | Admitting: Family Medicine

## 2018-12-31 ENCOUNTER — Ambulatory Visit (INDEPENDENT_AMBULATORY_CARE_PROVIDER_SITE_OTHER): Payer: Medicare HMO | Admitting: Family Medicine

## 2018-12-31 VITALS — BP 118/59 | HR 67 | Ht 72.0 in | Wt 219.0 lb

## 2018-12-31 DIAGNOSIS — M542 Cervicalgia: Secondary | ICD-10-CM

## 2018-12-31 DIAGNOSIS — M5030 Other cervical disc degeneration, unspecified cervical region: Secondary | ICD-10-CM

## 2018-12-31 NOTE — Patient Instructions (Signed)
Thank you for coming in today. Use a heating pad.  Use a TENS unit.  Use tylenol arthritis (extended release) Consider  Topical over the counter medicines like Icy Hot, aspercream, capsaicin. Attend PT.   Recheck in 6 weeks or sooner if needed.    Degenerative Disk Disease  Degenerative disk disease is a condition caused by changes that occur in the spinal disks as a person ages. Spinal disks are soft and compressible disks located between the bones of your spine (vertebrae). These disks act like shock absorbers. Degenerative disk disease can affect the whole spine. However, the neck and lower back are most often affected. Many changes can occur in the spinal disks with aging, such as:  The spinal disks may dry and shrink.  Small tears may occur in the tough, outer covering of the disk (annulus).  The disk space may become smaller due to loss of water.  Abnormal growths in the bone (spurs) may occur. This can put pressure on the nerve roots exiting the spinal canal, causing pain.  The spinal canal may become narrowed. What are the causes? This condition may be caused by:  Normal degeneration with age.  Injuries.  Certain activities and sports that cause damage. What increases the risk? The following factors may make you more likely to develop this condition:  Being overweight.  Having a family history of degenerative disk disease.  Smoking.  Sudden injury.  Doing work that requires heavy lifting. What are the signs or symptoms? Symptoms of this condition include:  Pain that varies in intensity. Some people have no pain, while others have severe pain. The location of the pain depends on the part of your backbone that is affected. You may have: ? Pain in your neck or arm if a disk in your neck area is affected. ? Pain in your back, buttocks, or legs if a disk in your lower back is affected.  Pain that becomes worse while bending or reaching up, or with twisting  movements.  Pain that may start gradually and then get worse as time passes. It may also start after a major or minor injury.  Numbness or tingling in the arms or legs. How is this diagnosed? This condition may be diagnosed based on:  Your symptoms and medical history.  A physical exam.  Imaging tests, including: ? An X-ray of the spine. ? MRI. How is this treated? This condition may be treated with:  Medicines.  Rehabilitation exercises. These activities aim to strengthen muscles in your back and abdomen to better support your spine. If treatments do not help to relieve your symptoms or you have severe pain, you may need surgery. Follow these instructions at home: Medicines  Take over-the-counter and prescription medicines only as told by your health care provider.  Do not drive or use heavy machinery while taking prescription pain medicine.  If you are taking prescription pain medicine, take actions to prevent or treat constipation. Your health care provider may recommend that you: ? Drink enough fluid to keep your urine pale yellow. ? Eat foods that are high in fiber, such as fresh fruits and vegetables, whole grains, and beans. ? Limit foods that are high in fat and processed sugars, such as fried or sweet foods. ? Take an over-the-counter or prescription medicine for constipation. Activity  Rest as told by your health care provider.  Ask your health care provider what activities are safe for you. Return to your normal activities as directed.  Avoid sitting  for a long time without moving. Get up to take short walks every 1-2 hours. This is important to improve blood flow and breathing. Ask for help if you feel weak or unsteady.  Perform relaxation exercises as told by your health care provider.  Maintain good posture.  Do not lift anything that is heavier than 10 lb (4.5 kg), or the limit that you are told, until your health care provider says that it is  safe.  Follow proper lifting and walking techniques as told by your health care provider. Managing pain, stiffness, and swelling   If directed, put ice on the painful area. Icing can help to relieve pain. ? Put ice in a plastic bag. ? Place a towel between your skin and the bag. ? Leave the ice on for 20 minutes, 2-3 times a day.  If directed, apply heat to the painful area as often as told by your health care provider. Heat can reduce the stiffness of your muscles. Use the heat source that your health care provider recommends, such as a moist heat pack or a heating pad. ? Place a towel between your skin and the heat source. ? Leave the heat on for 20-30 minutes. ? Remove the heat if your skin turns bright red. This is especially important if you are unable to feel pain, heat, or cold. You may have a greater risk of getting burned. General instructions  Change your sitting, standing, and sleeping habits as told by your health care provider.  Avoid sitting in the same position for long periods of time. Change positions frequently.  Lose weight or maintain a healthy weight as told by your health care provider.  Do not use any products that contain nicotine or tobacco, such as cigarettes and e-cigarettes. If you need help quitting, ask your health care provider.  Wear supportive footwear.  Keep all follow-up visits as told by your health care provider. This is important. This may include visits for physical therapy. Contact a health care provider if you:  Have pain that does not go away within 1-4 weeks.  Lose your appetite.  Lose weight without trying. Get help right away if you:  Have severe pain.  Notice weakness in your arms, hands, or legs.  Begin to lose control of your bladder or bowel movements.  Have fevers or night sweats. Summary  Degenerative disk disease is a condition caused by changes that occur in the spinal disks as a person ages.  Degenerative disk  disease can affect the whole spine. However, the neck and lower back are most often affected.  Take over-the-counter and prescription medicines only as told by your health care provider. This information is not intended to replace advice given to you by your health care provider. Make sure you discuss any questions you have with your health care provider. Document Released: 08/19/2007 Document Revised: 10/17/2017 Document Reviewed: 10/17/2017 Elsevier Interactive Patient Education  2019 [employer].

## 2018-12-31 NOTE — Progress Notes (Signed)
Kevin Wood is a 73 y.o. male who presents to [facility] today for neck pain.  Kevin Wood has a 62-month history of neck pain.  He points to the C6 region posteriorly as the location of most of his pain.  He feels a grinding sensation when he moves his neck.  He notes stiffness especially when he wakes up in the morning.  His pain typically improves by the end of the day.  He is been doing home exercises and some weightlifting which does help.  He notes he is been doing weightlifting and exercises which do help as well.  He denies radiating pain weakness or numbness.  He has a history of C3-C4 fusion in the past.    ROS:  As above  Exam:  BP (!) 118/59   Pulse 67   Ht 6' (1.829 m)   Wt 219 lb (99.3 kg)   BMI 29.70 kg/m  Wt Readings from Last 5 Encounters:  12/31/18 219 lb (99.3 kg)  07/24/18 226 lb (102.5 kg)  07/17/18 221 lb (100.2 kg)  01/21/18 217 lb (98.4 kg)  11/13/17 224 lb (101.6 kg)   General: Well Developed, well nourished, and in no acute distress.  Neuro/Psych: Alert and oriented x3, extra-ocular muscles intact, able to move all 4 extremities, sensation grossly intact. Skin: Warm and dry, no rashes noted.  Respiratory: Not using accessory muscles, speaking in full sentences, trachea midline.  Cardiovascular: Pulses palpable, no extremity edema. Abdomen: Does not appear distended. MSK:  Cervical spine nontender to midline.  Decreased cervical motion. Upper extremity strength equal and normal throughout.    Lab and Radiology Results EXAM: CERVICAL SPINE - COMPLETE 4+ VIEW  COMPARISON:  No prior.  FINDINGS: C3-C4 fusion. Diffuse osteopenia and degenerative change. Degenerative changes are particularly prominent from C5 through C7. Multilevel bilateral neural foraminal narrowing. No evidence of fracture. No acute abnormality. Ligamentous ossification noted. Carotid vascular calcification. Biapical  pleural-parenchymal thickening noted most consistent scarring.  IMPRESSION: 1.  C3-C4 fusion.  2. Diffuse osteopenia and multilevel degenerative change. Degenerative changes are particularly prominent from C5 through C7.  3.  Carotid vascular disease.   Electronically Signed   By: Kevin Wood Kevin Wood  Kevin Wood   On: 11/14/2017 06:22 I personally (independently) visualized and performed the interpretation of the images attached in this note.     Assessment and Plan: 73 y.o. male with 2 months of axial cervical pain.  Very likely degenerative in cause based on C-spine x-ray from January 2019.  Patient does not have weakness or radicular symptoms.  Plan to proceed with physical therapy trial heating pad Tylenol arthritis and over-the-counter topical medications as needed.  Recheck in about 6 weeks.  If not better likely neck step would be MRI for facet injection planning.   PDMP not reviewed this encounter. Orders Placed This Encounter  Procedures  . Ambulatory referral to Physical Therapy    Referral Priority:   Routine    Referral Type:   Physical Medicine    Referral Reason:   Specialty Services Required    Requested Specialty:   Physical Therapy   No orders of the defined types were placed in this encounter.   Historical information moved to improve visibility of documentation.  Past Medical History:  Diagnosis Date  . Elevated blood pressure 12/12/2012  . H/O degenerative disc disease 12/17/2012  . History of nephrolithiasis 12/17/2012  . Hyperlipidemia 12/12/2012  . Lumbar vertebral fracture ([place]) [place]  . Type 2  diabetes mellitus ([place]) 12/17/2012  . Unspecified vitamin D deficiency 12/17/2012   Outside Records    Past Surgical History:  Procedure Laterality Date  . c spine fustion  1994  . colon cancer  father  . GALLBLADDER SURGERY  2012   Social History   Tobacco Use  . Smoking status: Never Smoker  . Smokeless tobacco: Never Used  Substance Use Topics   . Alcohol use: Yes    Alcohol/week: 1.0 standard drinks    Types: 1 Standard drinks or equivalent per week   family history includes Cancer in his father.  Medications: Current Outpatient Medications  Medication Sig Dispense Refill  . lisinopril-hydrochlorothiazide (PRINZIDE,ZESTORETIC) 20-12.5 MG tablet Take 1 tablet by mouth daily. 90 tablet 3  . methocarbamol (ROBAXIN) 500 MG tablet Take 1 tablet (500 mg total) by mouth every 8 (eight) hours as needed for muscle spasms. 90 tablet 0  . simvastatin (ZOCOR) 40 MG tablet Take 1 tablet (40 mg total) by mouth daily. 90 tablet 3   No current facility-administered medications for this visit.    No Known Allergies    Discussed warning signs or symptoms. Please see discharge instructions. Patient expresses understanding.

## 2019-01-07 ENCOUNTER — Encounter: Payer: Self-pay | Admitting: Rehabilitative and Restorative Service Providers"

## 2019-01-07 ENCOUNTER — Other Ambulatory Visit: Payer: Self-pay

## 2019-01-07 ENCOUNTER — Ambulatory Visit: Payer: Medicare HMO | Admitting: Rehabilitative and Restorative Service Providers"

## 2019-01-07 DIAGNOSIS — M542 Cervicalgia: Secondary | ICD-10-CM

## 2019-01-07 DIAGNOSIS — R29898 Other symptoms and signs involving the musculoskeletal system: Secondary | ICD-10-CM | POA: Diagnosis not present

## 2019-01-07 DIAGNOSIS — R293 Abnormal posture: Secondary | ICD-10-CM

## 2019-01-07 NOTE — Therapy (Signed)
[facility] [place] [facility] [place], [place], [address] Phone: [phone]   Fax:  [phone]  Physical Therapy Evaluation  Patient Details  Name: Kevin Wood MRN: [id number] Date of Birth: 1946/06/26 Referring Provider (PT): Dr Kevin Wood    Encounter Date: 01/07/2019  PT End of Session - 01/07/19 1021    Visit Number  1    Number of Visits  12    Date for PT Re-Evaluation  02/18/19    PT Start Time  0938    PT Stop Time  1034    PT Time Calculation (min)  56 min    Activity Tolerance  Patient tolerated treatment well       Past Medical History:  Diagnosis Date  . Elevated blood pressure 12/12/2012  . H/O degenerative disc disease 12/17/2012  . History of nephrolithiasis 12/17/2012  . Hyperlipidemia 12/12/2012  . Lumbar vertebral fracture ([place]) [place]  . Type 2 diabetes mellitus ([place]) 12/17/2012  . Unspecified vitamin D deficiency 12/17/2012   Outside Records     Past Surgical History:  Procedure Laterality Date  . c spine fustion  1994  . colon cancer  father  . GALLBLADDER SURGERY  2012    There were no vitals filed for this visit.   Subjective Assessment - 01/07/19 0948    Subjective  Patient reports significant increase in neck pain in the past two months with no known injury. He is taking care of his wife who has Alzheimer's Disease.     Pertinent History  cervical fusion 2009; arthritis     Diagnostic tests  xrays     Patient Stated Goals  improve pain and be able to turn head and look up     Currently in Pain?  Yes    Pain Score  3     Pain Location  Neck    Pain Orientation  Right;Left;Mid    Pain Descriptors / Indicators  Tightness;Sore;Dull;Aching    Pain Type  Acute pain;Chronic pain    Pain Onset  More than a month ago    Pain Frequency  Constant    Aggravating Factors   looking up; turning head side to side; pushing wheelchairs at [facility]     Pain Relieving Factors  towel behind  neck; meds         [facility] PT Assessment - 01/07/19 0001      Assessment   Medical Diagnosis  Cervical dysfunction     Referring Provider (PT)  Dr Kevin Wood     Onset Date/Surgical Date  10/05/18    Hand Dominance  Left    Next MD Visit  PRN     Prior Therapy  yes here 1/19-2/19       Precautions   Precautions  None      Balance Screen   Has the patient fallen in the past 6 months  No    Has the patient had a decrease in activity level because of a fear of falling?   No    Is the patient reluctant to leave their home because of a fear of falling?   No      Prior Function   Level of Independence  Independent    Vocation  Retired;Volunteer work    Biomedical scientist  volunteering at the [employer]  all household chores; cooking with daughter's help       Observation/Other Assessments  Focus on Therapeutic Outcomes (FOTO)   55% limitation       Sensation   Additional Comments  WFL's per pt report       Posture/Postural Control   Posture Comments  head forward; shoulders rounded and elevated; head of humerus anterior in orientation       AROM   Cervical Flexion  41    Cervical Extension  8 pain     Cervical - Right Side Bend  26 pulling and pain     Cervical - Left Side Bend  17 pulling and pain     Cervical - Right Rotation  42 tightness and discomfort     Cervical - Left Rotation  41 tightness and discomfort       Strength   Overall Strength Comments  WFL's bilat UE's       Palpation   Spinal mobility  hypomobility upper thoracic and lower cervical with CPA mobs - fusion mid cervical spine     Palpation comment  significant muscular tightness noted through the ant/lat/post cervical musculature; pecs; upper trap; leveator' teres bilat                 Objective measurements completed on examination: See above findings.      [facility] Adult PT Treatment/Exercise - 01/07/19 0001      Neuro Re-ed    Neuro Re-ed Details    working on posture and alignment encouraging improved axial extension; engaging posterior shcouler girdle musculature       Shoulder Exercises: Standing   Other Standing Exercises  axial extension 10 sec x 5; scap squeeze 10 sec x 10; L's x 10; W's x 10 with noodle       Shoulder Exercises: Stretch   Other Shoulder Stretches  3 way doorway pec stretch 30 sec x 3 positions to pt tolerance       Moist Heat Therapy   Number Minutes Moist Heat  15 Minutes    Moist Heat Location  Cervical;Shoulder      Electrical Stimulation   Electrical Stimulation Location  bilat cervical     Electrical Stimulation Action  IFC    Electrical Stimulation Parameters  to tolerance    Electrical Stimulation Goals  Pain;Tone             PT Education - 01/07/19 1007    Education Details  HEP DN     Person(s) Educated  Patient    Methods  Explanation;Demonstration;Tactile cues;Verbal cues;Handout    Comprehension  Verbalized understanding;Returned demonstration;Verbal cues required;Tactile cues required          PT Long Term Goals - 01/07/19 1204      PT LONG TERM GOAL #1   Title  Improve posture and alignment with patient to demonstrate more upright posture/position of head over chest/shoudlers allowing improved cervical positioning and improved movement quality thus decreasing pain and improving function 02/18/2019    Time  6    Period  Weeks    Status  New      PT LONG TERM GOAL #2   Title  Increase cervical ROM in lateral flexion and rotation by 5-8 degrees 02/18/2019    Time  6    Period  Weeks    Status  New      PT LONG TERM GOAL #3   Title  Patient to report decreased stiffness with movement and improved tolerance for sitting to 10-15 min with minimal to no increase in pain 02/18/2019  Time  6    Period  Weeks    Status  New      PT LONG TERM GOAL #4   Title  Independent in HEP with good program to improve mobility 02/18/2019    Time  6    Period  Weeks    Status  New       PT LONG TERM GOAL #5   Title  Improve FOTO to </= 42% limitation 02/18/2019    Time  6    Period  Weeks    Status  New             Plan - 01/07/19 1022    Clinical Impression Statement  Kevin Wood presents with c/o 2-3 month history of flare up of chronic cervical dysfunction. He has pain through the center of his lower neck and upper thoracic spine which limits functional activity level and ability to rest. Patient has very poor posture with head forward posture; limited spine mobility; decreased cervical ROM; muscular tightness through the cervical and shoulder girdle musculature; pain; decreased ADLs. He will benefit from PT to address problems identified.     Personal Factors and Comorbidities  Age;Comorbidity 1;Other    Comorbidities  HTN; caring for wife in his home     Stability/Clinical Decision Making  Stable/Uncomplicated    Clinical Decision Making  Low    Rehab Potential  Good    PT Frequency  2x / week    PT Duration  6 weeks    PT Treatment/Interventions  ADLs/Self Care Home Management;Patient/family education;Cryotherapy;Electrical Stimulation;Iontophoresis 4mg /ml Dexamethasone;Moist Heat;Traction;Ultrasound;Neuromuscular re-education;Dry needling;Manual techniques;Therapeutic activities;Therapeutic exercise;Passive range of motion    PT Next Visit Plan  review HEP; continue work on posture and alignment; work on thoracic extension/cervical ROM; manual work ve DN cervical musculature; modalities as indicated     PT Home Exercise Plan  Access Code: [place] and Agree with Plan of Care  Patient       Patient will benefit from skilled therapeutic intervention in order to improve the following deficits and impairments:  Postural dysfunction, Improper body mechanics, Pain, Increased fascial restricitons, Increased muscle spasms, Hypomobility, Decreased range of motion, Decreased mobility, Decreased activity tolerance  Visit Diagnosis: Cervicalgia - Plan: PT plan of  care cert/re-cert  Other symptoms and signs involving the musculoskeletal system - Plan: PT plan of care cert/re-cert  Abnormal posture - Plan: PT plan of care cert/re-cert     Problem List Patient Active Problem List   Diagnosis Date Noted  . DNR (do not resuscitate) 07/24/2018  . 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 Lateral epicondylitis of right elbow 01/21/2018  . Thyroid nodule 11/16/2017  . Carotid arterial disease ([place]) 11/14/2017  . Seborrheic keratoses 11/07/2016  . Acute medial meniscus tear of left knee 05/15/2016  . Internal hemorrhoids 04/12/2015  . 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 Essential hypertension 08/11/2013  . Controlled type 2 diabetes mellitus without complication, without long-term current use of insulin ([place]) 12/17/2012  . History of nephrolithiasis 12/17/2012  . Vitamin D deficiency 12/17/2012  . Excessive cerumen in ear canal 12/17/2012  . H/O degenerative disc disease 12/17/2012  . 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 Family history of colon cancer 12/17/2012  . Hyperlipidemia 12/12/2012    Ellery Meroney Nilda Simmer PT, MPH  01/07/2019, 12:08 PM  [facility] [place], [place], [address] Phone: [phone]   Fax:  [phone]  Name: Kevin  Wood MRN: [id number] Date of Birth: 1946/03/24

## 2019-01-07 NOTE — Patient Instructions (Signed)
Access Code: [email or web address]  URL: [email or web address]  Date: 01/07/2019  Prepared by: Gillermo Murdoch   Exercises  Seated Cervical Retraction - 10 reps - 1 sets - 3x daily - 7x weekly  Standing Scapular Retraction - 10 reps - 1 sets - 10 hold - 3x daily - 7x weekly  Shoulder External Rotation and Scapular Retraction - 10 reps - 1 sets - hold - 3x daily - 7x weekly  Standing Scapular Retraction in Abduction - 10 reps - 1 sets - 3x daily - 7x weekly  Doorway Pec Stretch at 60 Degrees Abduction - 3 reps - 1 sets - 3x daily - 7x weekly  Doorway Pec Stretch at 90 Degrees Abduction - 3 reps - 1 sets - 30 seconds hold - 3x daily - 7x weekly  Doorway Pec Stretch at 120 Degrees Abduction - 3 reps - 1 sets - 30 second hold hold - 3x daily - 7x weekly  Patient Education  Trigger Point Dry Needling

## 2019-01-09 ENCOUNTER — Ambulatory Visit: Payer: Medicare HMO | Admitting: Physical Therapy

## 2019-01-09 ENCOUNTER — Encounter: Payer: Self-pay | Admitting: Physical Therapy

## 2019-01-09 DIAGNOSIS — R29898 Other symptoms and signs involving the musculoskeletal system: Secondary | ICD-10-CM

## 2019-01-09 DIAGNOSIS — M542 Cervicalgia: Secondary | ICD-10-CM | POA: Diagnosis not present

## 2019-01-09 DIAGNOSIS — R293 Abnormal posture: Secondary | ICD-10-CM

## 2019-01-09 NOTE — Therapy (Signed)
[place] [place], [place], [address] Phone: [phone]   Fax:  [phone]  Physical Therapy Treatment  Patient Details  Name: Kevin Wood MRN: [id number] Date of Birth: [birth year] Referring Provider (PT): Dr Lynne Leader    Encounter Date: 01/09/2019  PT End of Session - 01/09/19 0849    Visit Number  2    Number of Visits  12    Date for PT Re-Evaluation  02/18/19    PT Start Time  0845    PT Stop Time  0925    PT Time Calculation (min)  40 min    Activity Tolerance  Patient tolerated treatment well;No increased pain    Behavior During Therapy  WFL for tasks assessed/performed       Past Medical History:  Diagnosis Date  . Elevated blood pressure 12/12/2012  . H/O degenerative disc disease 12/17/2012  . History of nephrolithiasis 12/17/2012  . Hyperlipidemia 12/12/2012  . Lumbar vertebral fracture ([place]) [place]  . Type 2 diabetes mellitus ([place]) 12/17/2012  . Unspecified vitamin D deficiency 12/17/2012   Outside Records     Past Surgical History:  Procedure Laterality Date  . c spine fustion  1994  . colon cancer  father  . GALLBLADDER SURGERY  2012    There were no vitals filed for this visit.  Subjective Assessment - 01/09/19 0849    Subjective  Pt reports no new changes since last visit.  He completed his exercises 3x yesterday. He is still going to gym 4x/wk.     Patient Stated Goals  improve pain and be able to turn head and look up     Currently in Pain?  Yes    Pain Score  3     Pain Location  Neck    Pain Orientation  Right;Left    Pain Descriptors / Indicators  Aching;Tightness         [facility] PT Assessment - 01/09/19 0001      Assessment   Medical Diagnosis  Cervical dysfunction     Referring Provider (PT)  Dr Lynne Leader     Onset Date/Surgical Date  10/05/18    Hand Dominance  Left    Next MD Visit  PRN     Prior Therapy  yes here 1/19-2/19        [facility]  Adult PT Treatment/Exercise - 01/09/19 0001      Shoulder Exercises: Seated   Row  Strengthening;Both;10 reps;Theraband   3 sec hold in retractin   Theraband Level (Shoulder Row)  Level 3 (Green)      Shoulder Exercises: Standing   Other Standing Exercises  W's with 3 sec hold x 10 reps with cues for technique and posture.       Shoulder Exercises: ROM/Strengthening   UBE (Upper Arm Bike)  L3: 2 min forward, 2 min backward.       Shoulder Exercises: Stretch   Other Shoulder Stretches  3 way doorway pec stretch 30 sec x 2 positions to pt tolerance;   Over head stretch x 20 sec x 2 reps       Modalities   Modalities  Electrical Stimulation;Ultrasound      Acupuncturist Location  bilat cervical paraspinals    Electrical Stimulation Action  combo [place]     Electrical Stimulation Parameters  intensity to pt tolerance; pt prone     Electrical Stimulation Goals  Pain;Tone  Ultrasound   Ultrasound Location  see estim    Ultrasound Parameters  combo: 100%, 1.3 w/cm2, 8 min     Ultrasound Goals  Pain;Other (Comment)   tone     Neck Exercises: Stretches   Upper Trapezius Stretch  Right;Left;3 reps;10 seconds    Levator Stretch  Right;Left;2 reps;10 seconds             PT Education - 01/09/19 0941    Education Details  HEP - added row     Person(s) Educated  Patient    Methods  Explanation    Comprehension  Verbalized understanding;Returned demonstration          PT Long Term Goals - 01/07/19 1204      PT LONG TERM GOAL #1   Title  Improve posture and alignment with patient to demonstrate more upright posture/position of head over chest/shoudlers allowing improved cervical positioning and improved movement quality thus decreasing pain and improving function 02/18/2019    Time  6    Period  Weeks    Status  New      PT LONG TERM GOAL #2   Title  Increase cervical ROM in lateral flexion and rotation by 5-8 degrees 02/18/2019    Time   6    Period  Weeks    Status  New      PT LONG TERM GOAL #3   Title  Patient to report decreased stiffness with movement and improved tolerance for sitting to 10-15 min with minimal to no increase in pain 02/18/2019    Time  6    Period  Weeks    Status  New      PT LONG TERM GOAL #4   Title  Independent in HEP with good program to improve mobility 02/18/2019    Time  6    Period  Weeks    Status  New      PT LONG TERM GOAL #5   Title  Improve FOTO to </= 42% limitation 02/18/2019    Time  6    Period  Weeks    Status  New            Plan - 01/09/19 0932    Clinical Impression Statement  Pt tolerated unilateral high position doorway stretch better than bilat.  Added rowing to HEP without difficulty.  Pt reported reduction of pain and tightness after combo [place] (Pt declined estim as he has a TENS unit at home). Goals are ongoing at this time.     Rehab Potential  Good    PT Frequency  2x / week    PT Duration  6 weeks    PT Treatment/Interventions  ADLs/Self Care Home Management;Patient/family education;Cryotherapy;Electrical Stimulation;Iontophoresis 4mg /ml Dexamethasone;Moist Heat;Traction;Ultrasound;Neuromuscular re-education;Dry needling;Manual techniques;Therapeutic activities;Therapeutic exercise;Passive range of motion    PT Next Visit Plan  continue work on posture and alignment; work on thoracic extension/cervical ROM; manual work (including spinal mobs) vs DN cervical musculature.     PT Home Exercise Plan  Access Code: [place]     Consulted and Agree with Plan of Care  Patient       Patient will benefit from skilled therapeutic intervention in order to improve the following deficits and impairments:  Postural dysfunction, Improper body mechanics, Pain, Increased fascial restricitons, Increased muscle spasms, Hypomobility, Decreased range of motion, Decreased mobility, Decreased activity tolerance  Visit Diagnosis: Cervicalgia  Other symptoms and signs involving the  musculoskeletal system  Abnormal posture     Problem  List Patient Active Problem List   Diagnosis Date Noted  . DNR (do not resuscitate) 07/24/2018  . 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 Lateral epicondylitis of right elbow 01/21/2018  . Thyroid nodule 11/16/2017  . Carotid arterial disease ([place]) 11/14/2017  . Seborrheic keratoses 11/07/2016  . Acute medial meniscus tear of left knee 05/15/2016  . Internal hemorrhoids 04/12/2015  . 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 Essential hypertension 08/11/2013  . Controlled type 2 diabetes mellitus without complication, without long-term current use of insulin ([place]) 12/17/2012  . History of nephrolithiasis 12/17/2012  . Vitamin D deficiency 12/17/2012  . Excessive cerumen in ear canal 12/17/2012  . H/O degenerative disc disease 12/17/2012  . 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 Family history of colon cancer 12/17/2012  . Hyperlipidemia 12/12/2012   Kerin Perna, PTA 01/09/19 9:42 AM  [facility] [place], [place], [address] Phone: [phone]   Fax:  [phone]  Name: Darry Kelnhofer MRN: [id number] Date of Birth: 1946/03/22

## 2019-01-13 ENCOUNTER — Ambulatory Visit: Payer: Medicare HMO | Admitting: Rehabilitative and Restorative Service Providers"

## 2019-01-13 ENCOUNTER — Encounter: Payer: Self-pay | Admitting: Rehabilitative and Restorative Service Providers"

## 2019-01-13 DIAGNOSIS — R29898 Other symptoms and signs involving the musculoskeletal system: Secondary | ICD-10-CM | POA: Diagnosis not present

## 2019-01-13 DIAGNOSIS — R293 Abnormal posture: Secondary | ICD-10-CM | POA: Diagnosis not present

## 2019-01-13 DIAGNOSIS — M542 Cervicalgia: Secondary | ICD-10-CM | POA: Diagnosis not present

## 2019-01-13 NOTE — Therapy (Signed)
[place], [place], [address] Phone: [phone]   Fax:  [phone]  Physical Therapy Treatment  Patient Details  Name: Kevin Wood MRN: [id number] Date of Birth: 1946-01-23 Referring Provider (PT): Dr Lynne Leader    Encounter Date: 01/13/2019  PT End of Session - 01/13/19 1107    Visit Number  3    Number of Visits  12    Date for PT Re-Evaluation  02/18/19    PT Start Time  1100    PT Stop Time  1159    PT Time Calculation (min)  59 min    Activity Tolerance  Patient tolerated treatment well       Past Medical History:  Diagnosis Date  . Elevated blood pressure 12/12/2012  . H/O degenerative disc disease 12/17/2012  . History of nephrolithiasis 12/17/2012  . Hyperlipidemia 12/12/2012  . Lumbar vertebral fracture ([place]) [place]  . Type 2 diabetes mellitus ([place]) 12/17/2012  . Unspecified vitamin D deficiency 12/17/2012   Outside Records     Past Surgical History:  Procedure Laterality Date  . c spine fustion  1994  . colon cancer  father  . GALLBLADDER SURGERY  2012    There were no vitals filed for this visit.  Subjective Assessment - 01/13/19 1111    Subjective  Less pain - most of the pain he has is at night. Still notices that he is stiff when turning his head. Feeling better. Working on his exercises 3 times a day and in the gym 4 days a week.     Currently in Pain?  No/denies                       [facility] Adult PT Treatment/Exercise - 01/13/19 0001      Shoulder Exercises: ROM/Strengthening   UBE (Upper Arm Bike)  L3: 2.5 min forward, 2 min backward.       Shoulder Exercises: Stretch   Other Shoulder Stretches  3 way doorway pec stretch 30 sec x 2 positions to pt tolerance;   Over head stretch x 20 sec x 2 reps       Moist Heat Therapy   Number Minutes Moist Heat  20 Minutes    Moist Heat Location  Cervical;Shoulder   thoracic      Electrical  Stimulation   Electrical Stimulation Location  bilat cervical paraspinals    Electrical Stimulation Action  IFC    Electrical Stimulation Parameters  to tolerance    Electrical Stimulation Goals  Pain;Tone      Manual Therapy   Manual therapy comments  pt prone and supine     Joint Mobilization  CPA mobs cervical and thoracic spine; lateral mobs upper thoracic spine     Soft tissue mobilization  deep tissue work posterior cervical spine     Myofascial Release  posterior cervical to upper thoracic wall     Passive ROM  stretch to cervical spine into lateral flexion PT assist; cervical rotation with pillow case and PT assist     Manual Traction  cervical traction 10-20 sec several reps; traction through long arm - UE in exxtension 20-30 sec hold x 1 each UE       Neck Exercises: Stretches   Upper Trapezius Stretch  Right;Left;3 reps;10 seconds    Levator Stretch  Right;Left;2 reps;10 seconds    Chest Stretch  2 reps;30 seconds   T at wall drop  one arm - stepping away from arm on wall                  PT Long Term Goals - 01/07/19 1204      PT LONG TERM GOAL #1   Title  Improve posture and alignment with patient to demonstrate more upright posture/position of head over chest/shoudlers allowing improved cervical positioning and improved movement quality thus decreasing pain and improving function 02/18/2019    Time  6    Period  Weeks    Status  New      PT LONG TERM GOAL #2   Title  Increase cervical ROM in lateral flexion and rotation by 5-8 degrees 02/18/2019    Time  6    Period  Weeks    Status  New      PT LONG TERM GOAL #3   Title  Patient to report decreased stiffness with movement and improved tolerance for sitting to 10-15 min with minimal to no increase in pain 02/18/2019    Time  6    Period  Weeks    Status  New      PT LONG TERM GOAL #4   Title  Independent in HEP with good program to improve mobility 02/18/2019    Time  6    Period  Weeks    Status  New       PT LONG TERM GOAL #5   Title  Improve FOTO to </= 42% limitation 02/18/2019    Time  6    Period  Weeks    Status  New            Plan - 01/13/19 1108    Clinical Impression Statement  Improving with less pain and tightness through the cervical spine. Fawaz is working on exercises at home and going to the gym. Added stretch for anterior chest without difficulty. Added manual work with mobs - patient reports some soreness following manual work - finished with heat and stim. Progressing well towards goals of therapy.     Rehab Potential  Good    PT Frequency  2x / week    PT Duration  6 weeks    PT Treatment/Interventions  ADLs/Self Care Home Management;Patient/family education;Cryotherapy;Electrical Stimulation;Iontophoresis 4mg /ml Dexamethasone;Moist Heat;Traction;Ultrasound;Neuromuscular re-education;Dry needling;Manual techniques;Therapeutic activities;Therapeutic exercise;Passive range of motion    PT Next Visit Plan  continue work on posture and alignment; work on thoracic extension/cervical ROM; manual work (including spinal mobs) vs DN cervical musculature.     PT Home Exercise Plan  Access Code: [place]     Consulted and Agree with Plan of Care  Patient       Patient will benefit from skilled therapeutic intervention in order to improve the following deficits and impairments:  Postural dysfunction, Improper body mechanics, Pain, Increased fascial restricitons, Increased muscle spasms, Hypomobility, Decreased range of motion, Decreased mobility, Decreased activity tolerance  Visit Diagnosis: Cervicalgia  Other symptoms and signs involving the musculoskeletal system  Abnormal posture     Problem List Patient Active Problem List   Diagnosis Date Noted  . DNR (do not resuscitate) 07/24/2018  . 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 Lateral epicondylitis of right elbow 01/21/2018  . Thyroid nodule 11/16/2017  . Carotid arterial disease ([place]) 11/14/2017   . Seborrheic keratoses 11/07/2016  . Acute medial meniscus tear of left knee 05/15/2016  . Internal hemorrhoids 04/12/2015  . 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 Essential hypertension 08/11/2013  . Controlled type  2 diabetes mellitus without complication, without long-term current use of insulin ([place]) 12/17/2012  . History of nephrolithiasis 12/17/2012  . Vitamin D deficiency 12/17/2012  . Excessive cerumen in ear canal 12/17/2012  . H/O degenerative disc disease 12/17/2012  . 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 Family history of colon cancer 12/17/2012  . Hyperlipidemia 12/12/2012    Venisa Frampton Nilda Simmer PT, MPH  01/13/2019, 11:48 AM  [facility] [place], [place], [address] Phone: [phone]   Fax:  [phone]  Name: Erron Wengert MRN: [id number] Date of Birth: 11-10-1945

## 2019-01-15 ENCOUNTER — Other Ambulatory Visit: Payer: Self-pay

## 2019-01-15 ENCOUNTER — Encounter: Payer: Self-pay | Admitting: Physical Therapy

## 2019-01-15 ENCOUNTER — Ambulatory Visit: Payer: Medicare HMO | Admitting: Physical Therapy

## 2019-01-15 DIAGNOSIS — R293 Abnormal posture: Secondary | ICD-10-CM | POA: Diagnosis not present

## 2019-01-15 DIAGNOSIS — R29898 Other symptoms and signs involving the musculoskeletal system: Secondary | ICD-10-CM | POA: Diagnosis not present

## 2019-01-15 DIAGNOSIS — M542 Cervicalgia: Secondary | ICD-10-CM

## 2019-01-15 NOTE — Therapy (Signed)
[place], [place], [address] Phone: [phone]   Fax:  [phone]  Physical Therapy Treatment  Patient Details  Name: Kevin Wood MRN: [id number] Date of Birth: 1945/11/08 Referring Provider (PT): Dr Lynne Leader    Encounter Date: 01/15/2019  PT End of Session - 01/15/19 1407    Visit Number  4    Number of Visits  12    Date for PT Re-Evaluation  02/18/19    PT Start Time  9024    PT Stop Time  1455    PT Time Calculation (min)  50 min    Activity Tolerance  Patient tolerated treatment well    Behavior During Therapy  [facility] for tasks assessed/performed       Past Medical History:  Diagnosis Date  . Elevated blood pressure 12/12/2012  . H/O degenerative disc disease 12/17/2012  . History of nephrolithiasis 12/17/2012  . Hyperlipidemia 12/12/2012  . Lumbar vertebral fracture ([place]) [place]  . Type 2 diabetes mellitus ([place]) 12/17/2012  . Unspecified vitamin D deficiency 12/17/2012   Outside Records     Past Surgical History:  Procedure Laterality Date  . c spine fustion  1994  . colon cancer  father  . GALLBLADDER SURGERY  2012    There were no vitals filed for this visit.  Subjective Assessment - 01/15/19 1407    Subjective  Pt reports he was sore all day yesterday from shoveling dirt, but today he feels good.  He is pleased with his progress so far.      Patient Stated Goals  improve pain and be able to turn head and look up     Currently in Pain?  Yes    Pain Score  2     Pain Location  Neck    Pain Orientation  Right;Left   Rt>Lt   Pain Descriptors / Indicators  Sore;Tightness    Aggravating Factors   turning head side to side     Pain Relieving Factors  heat, massage          [facility] PT Assessment - 01/15/19 0001      Assessment   Medical Diagnosis  Cervical dysfunction     Referring Provider (PT)  Dr Lynne Leader     Onset Date/Surgical Date  10/05/18    Hand  Dominance  Left    Next MD Visit  PRN     Prior Therapy  yes here 1/19-2/19       AROM   Cervical Flexion  40    Cervical Extension  20    Cervical - Right Side Bend  15    Cervical - Left Side Bend  15    Cervical - Right Rotation  38    Cervical - Left Rotation  40       [facility] Adult PT Treatment/Exercise - 01/15/19 0001      Shoulder Exercises: Prone   Extension  Both;10 reps   with scap retraction   Other Prone Exercises  prone row, bilat with scap retraction x 10 reps; trial of scap retraction with goal post arms- unable to tolerate.       Shoulder Exercises: ROM/Strengthening   UBE (Upper Arm Bike)  L3: 1.5 min forward, 1.5 min backward.       Shoulder Exercises: Stretch   Other Shoulder Stretches  thoracic ext over green pool noodle wth head support x 10 sec x 2 reps; repeated trial with  black bolster, unable to tolerate.     Other Shoulder Stretches  3 way doorway pec stretch 30 sec x 2 positions to pt tolerance;   Over head stretch x 20 sec x 2 reps       Moist Heat Therapy   Number Minutes Moist Heat  15 Minutes    Moist Heat Location  Cervical;Shoulder   thoracic      Electrical Stimulation   Electrical Stimulation Location  bilat cervical paraspinals    Electrical Stimulation Action  IFC    Electrical Stimulation Parameters  intensity to pt tolerance x 15 min    Electrical Stimulation Goals  Pain;Tone      Manual Therapy   Joint Mobilization  CPA mobs to thoracic spine; lateral mobs upper thoracic spine    performed by Gillermo Murdoch, PT   Soft tissue mobilization  STM to bilat cervical and thoracic paraspinals.     Myofascial Release  thoracic paraspinals     Passive ROM  cervical rotation Lt    Manual Traction  cervical traction 10-20 sec several reps                  PT Long Term Goals - 01/07/19 1204      PT LONG TERM GOAL #1   Title  Improve posture and alignment with patient to demonstrate more upright posture/position of head over  chest/shoudlers allowing improved cervical positioning and improved movement quality thus decreasing pain and improving function 02/18/2019    Time  6    Period  Weeks    Status  New      PT LONG TERM GOAL #2   Title  Increase cervical ROM in lateral flexion and rotation by 5-8 degrees 02/18/2019    Time  6    Period  Weeks    Status  New      PT LONG TERM GOAL #3   Title  Patient to report decreased stiffness with movement and improved tolerance for sitting to 10-15 min with minimal to no increase in pain 02/18/2019    Time  6    Period  Weeks    Status  New      PT LONG TERM GOAL #4   Title  Independent in HEP with good program to improve mobility 02/18/2019    Time  6    Period  Weeks    Status  New      PT LONG TERM GOAL #5   Title  Improve FOTO to </= 42% limitation 02/18/2019    Time  6    Period  Weeks    Status  New            Plan - 01/15/19 1441    Clinical Impression Statement  Pt's cervical ROM remains limited; improved cervical ext.  Pt reporting overall improvement in symptoms and less pain and tightness. Pt reported some increased stiffness after manual therapy initially; reduced with use of estim/MHP at end of session.  Progressing towards goals of therapy.     Rehab Potential  Good    PT Frequency  2x / week    PT Duration  6 weeks    PT Treatment/Interventions  ADLs/Self Care Home Management;Patient/family education;Cryotherapy;Electrical Stimulation;Iontophoresis 4mg /ml Dexamethasone;Moist Heat;Traction;Ultrasound;Neuromuscular re-education;Dry needling;Manual techniques;Therapeutic activities;Therapeutic exercise;Passive range of motion    PT Next Visit Plan  continue work on posture and alignment; work on thoracic extension/cervical ROM; manual work (including spinal mobs) vs DN cervical musculature.     PT  Home Exercise Plan  Access Code: [place]     Consulted and Agree with Plan of Care  Patient       Patient will benefit from skilled therapeutic  intervention in order to improve the following deficits and impairments:  Postural dysfunction, Improper body mechanics, Pain, Increased fascial restricitons, Increased muscle spasms, Hypomobility, Decreased range of motion, Decreased mobility, Decreased activity tolerance  Visit Diagnosis: Cervicalgia  Other symptoms and signs involving the musculoskeletal system  Abnormal posture     Problem List Patient Active Problem List   Diagnosis Date Noted  . DNR (do not resuscitate) 07/24/2018  . 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 Lateral epicondylitis of right elbow 01/21/2018  . Thyroid nodule 11/16/2017  . Carotid arterial disease ([place] [address]) 11/14/2017  . Seborrheic keratoses 11/07/2016  . Acute medial meniscus tear of left knee 05/15/2016  . Internal hemorrhoids 04/12/2015  . 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 Essential hypertension 08/11/2013  . Controlled type 2 diabetes mellitus without complication, without long-term current use of insulin ([place]) 12/17/2012  . History of nephrolithiasis 12/17/2012  . Vitamin D deficiency 12/17/2012  . Excessive cerumen in ear canal 12/17/2012  . H/O degenerative disc disease 12/17/2012  . 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 Family history of colon cancer 12/17/2012  . Hyperlipidemia 12/12/2012   Kerin Perna, PTA 01/15/19 2:47 PM  Celyn P. Helene Kelp PT, MPH 01/15/19 2:53 PM   [facility] [place], [place], [address] Phone: [phone]   Fax:  [phone]  Name: Kevin Wood MRN: [id number] Date of Birth: 18-Jan-1946

## 2019-01-19 ENCOUNTER — Ambulatory Visit: Payer: Medicare HMO | Admitting: Physical Therapy

## 2019-01-19 ENCOUNTER — Other Ambulatory Visit: Payer: Self-pay

## 2019-01-19 ENCOUNTER — Encounter: Payer: Self-pay | Admitting: Physical Therapy

## 2019-01-19 DIAGNOSIS — M542 Cervicalgia: Secondary | ICD-10-CM | POA: Diagnosis not present

## 2019-01-19 DIAGNOSIS — R293 Abnormal posture: Secondary | ICD-10-CM

## 2019-01-19 DIAGNOSIS — R29898 Other symptoms and signs involving the musculoskeletal system: Secondary | ICD-10-CM | POA: Diagnosis not present

## 2019-01-19 NOTE — Therapy (Signed)
[place], [place], [address] Phone: [phone]   Fax:  [phone]  Physical Therapy Treatment  Patient Details  Name: Kevin Wood MRN: [id number] Date of Birth: [birth year] Referring Provider (PT): Dr Lynne Leader    Encounter Date: 01/19/2019  PT End of Session - 01/19/19 1430    Visit Number  5    Number of Visits  12    Date for PT Re-Evaluation  02/18/19    PT Start Time  2683    PT Stop Time  1450    PT Time Calculation (min)  47 min       Past Medical History:  Diagnosis Date  . Elevated blood pressure 12/12/2012  . H/O degenerative disc disease 12/17/2012  . History of nephrolithiasis 12/17/2012  . Hyperlipidemia 12/12/2012  . Lumbar vertebral fracture ([place]) [place]  . Type 2 diabetes mellitus ([place]) 12/17/2012  . Unspecified vitamin D deficiency 12/17/2012   Outside Records     Past Surgical History:  Procedure Laterality Date  . c spine fustion  1994  . colon cancer  father  . GALLBLADDER SURGERY  2012    There were no vitals filed for this visit.  Subjective Assessment - 01/19/19 1541    Subjective  "I feel pretty good".  Pt reports he wakes up stiff in the morning, but does his exercises and the pain resolves.  He voices interest in having one additional visit after today before hold or d/c.      Currently in Pain?  No/denies    Pain Score  0-No pain         [facility] PT Assessment - 01/19/19 0001      Assessment   Medical Diagnosis  Cervical dysfunction     Referring Provider (PT)  Dr Lynne Leader     Onset Date/Surgical Date  10/05/18    Hand Dominance  Left    Next MD Visit  PRN     Prior Therapy  yes here 1/19-2/19         [facility] Adult PT Treatment/Exercise - 01/19/19 0001      Shoulder Exercises: ROM/Strengthening   UBE (Upper Arm Bike)  L3: 2.5 min forward, 2 min backward.       Moist Heat Therapy   Number Minutes Moist Heat  20 Minutes    Moist  Heat Location  Cervical;Shoulder   thoracic      Electrical Stimulation   Electrical Stimulation Location  bilat cervical paraspinals and upper trap     Electrical Stimulation Action  IFC    Electrical Stimulation Parameters  intensity to pt tolerance x 20 min    Electrical Stimulation Goals  Tone;Pain      Manual Therapy   Manual therapy comments  pt in supine     Soft tissue mobilization  STM to bilat cervical and thoracic paraspinals.  Pin and stretch to Rt scalenes with Lt rotation/ lateral flexion.     Myofascial Release  to Rt scalenes, bilat upper trap. suboccipital release.    Manual Traction  cervical traction 10-20 sec several reps      Neck Exercises: Stretches   Upper Trapezius Stretch  Right;Left;3 reps;10 seconds    Other Neck Stretches  reviewed current HEP verbally; pt reported he had performed exercises prior to session.                   PT Long Term Goals - 01/07/19 1204  PT LONG TERM GOAL #1   Title  Improve posture and alignment with patient to demonstrate more upright posture/position of head over chest/shoudlers allowing improved cervical positioning and improved movement quality thus decreasing pain and improving function 02/18/2019    Time  6    Period  Weeks    Status  New      PT LONG TERM GOAL #2   Title  Increase cervical ROM in lateral flexion and rotation by 5-8 degrees 02/18/2019    Time  6    Period  Weeks    Status  New      PT LONG TERM GOAL #3   Title  Patient to report decreased stiffness with movement and improved tolerance for sitting to 10-15 min with minimal to no increase in pain 02/18/2019    Time  6    Period  Weeks    Status  New      PT LONG TERM GOAL #4   Title  Independent in HEP with good program to improve mobility 02/18/2019    Time  6    Period  Weeks    Status  New      PT LONG TERM GOAL #5   Title  Improve FOTO to </= 42% limitation 02/18/2019    Time  6    Period  Weeks    Status  New             Plan - 01/19/19 1532    Clinical Impression Statement  Pt reported improvement of symptoms, despite limited cervical lateral flexion.  Pt had performed all exercises prior to therapy, so they were verbally reviewed to ensure no changes/modifications needed to be made.  Pt reported some decrease in stiffness after manual therapy and estim/MHP.  Pt is progressing towards established therapy goals.     Rehab Potential  Good    PT Frequency  2x / week    PT Duration  6 weeks    PT Treatment/Interventions  ADLs/Self Care Home Management;Patient/family education;Cryotherapy;Electrical Stimulation;Iontophoresis 4mg /ml Dexamethasone;Moist Heat;Traction;Ultrasound;Neuromuscular re-education;Dry needling;Manual techniques;Therapeutic activities;Therapeutic exercise;Passive range of motion    PT Next Visit Plan  assess goals and readiness to d/c    PT Home Exercise Plan  Access Code: [place] and Agree with Plan of Care  Patient       Patient will benefit from skilled therapeutic intervention in order to improve the following deficits and impairments:  Postural dysfunction, Improper body mechanics, Pain, Increased fascial restricitons, Increased muscle spasms, Hypomobility, Decreased range of motion, Decreased mobility, Decreased activity tolerance  Visit Diagnosis: Cervicalgia  Other symptoms and signs involving the musculoskeletal system  Abnormal posture     Problem List Patient Active Problem List   Diagnosis Date Noted  . DNR (do not resuscitate) 07/24/2018  . 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 Lateral epicondylitis of right elbow 01/21/2018  . Thyroid nodule 11/16/2017  . Carotid arterial disease ([place]) 11/14/2017  . Seborrheic keratoses 11/07/2016  . Acute medial meniscus tear of left knee 05/15/2016  . Internal hemorrhoids 04/12/2015  . 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 Essential hypertension 08/11/2013  . Controlled type 2 diabetes  mellitus without complication, without long-term current use of insulin ([place]) 12/17/2012  . History of nephrolithiasis 12/17/2012  . Vitamin D deficiency 12/17/2012  . Excessive cerumen in ear canal 12/17/2012  . H/O degenerative disc disease 12/17/2012  . 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 Family history of colon cancer  12/17/2012  . Hyperlipidemia 12/12/2012   Kerin Perna, PTA 01/19/19 3:48 PM  [place], [place], [address] Phone: [phone]   Fax:  [phone]  Name: Kevin Wood MRN: [id number] Date of Birth: 01-29-1946

## 2019-01-21 ENCOUNTER — Ambulatory Visit: Payer: Medicare HMO | Admitting: Physical Therapy

## 2019-01-21 ENCOUNTER — Other Ambulatory Visit: Payer: Self-pay

## 2019-01-21 DIAGNOSIS — R29898 Other symptoms and signs involving the musculoskeletal system: Secondary | ICD-10-CM

## 2019-01-21 DIAGNOSIS — M542 Cervicalgia: Secondary | ICD-10-CM | POA: Diagnosis not present

## 2019-01-21 DIAGNOSIS — R293 Abnormal posture: Secondary | ICD-10-CM

## 2019-01-21 NOTE — Therapy (Addendum)
[place], [place], [address] Phone: [phone]   Fax:  [phone]  Physical Therapy Treatment  Patient Details  Name: Kevin Wood MRN: [id number] Date of Birth: 1946-05-20 Referring Provider (PT): Dr Lynne Leader    Encounter Date: 01/21/2019  PT End of Session - 01/21/19 1406    Visit Number  6    Number of Visits  12    Date for PT Re-Evaluation  02/18/19    PT Start Time  0254    PT Stop Time  1445    PT Time Calculation (min)  43 min    Activity Tolerance  Patient tolerated treatment well    Behavior During Therapy  [facility] for tasks assessed/performed       Past Medical History:  Diagnosis Date  . Elevated blood pressure 12/12/2012  . H/O degenerative disc disease 12/17/2012  . History of nephrolithiasis 12/17/2012  . Hyperlipidemia 12/12/2012  . Lumbar vertebral fracture ([place]) [place]  . Type 2 diabetes mellitus ([place]) 12/17/2012  . Unspecified vitamin D deficiency 12/17/2012   Outside Records     Past Surgical History:  Procedure Laterality Date  . c spine fustion  1994  . colon cancer  father  . GALLBLADDER SURGERY  2012    There were no vitals filed for this visit.  Subjective Assessment - 01/21/19 1407    Subjective  "I'm pain free now".  Pt reports he has stiffness when he woke up, but it resolved with moving around (ADLs).  He voices interest in putting therapy on hold for 1 month while he works on [employer].     Patient Stated Goals  improve pain and be able to turn head and look up     Currently in Pain?  No/denies    Pain Score  0-No pain         [facility] PT Assessment - 01/21/19 0001      Observation/Other Assessments   Focus on Therapeutic Outcomes (FOTO)   33% limitation      AROM   Cervical Flexion  44    Cervical Extension  30    Cervical - Right Side Bend  21    Cervical - Left Side Bend  15    Cervical - Right Rotation  40    Cervical - Left Rotation   44      [facility] Adult PT Treatment/Exercise - 01/21/19 0001      Exercises   Exercises  Shoulder      Shoulder Exercises: Seated   External Rotation  Both;20 reps;Theraband    Theraband Level (Shoulder External Rotation)  Level 2 (Red)    Other Seated Exercises  thoracic ext over back of chair x 5 sec x 5 reps      Shoulder Exercises: Sidelying   Other Sidelying Exercises  thoracic rotation with red band x 10 reps (open book) each side      Shoulder Exercises: ROM/Strengthening   UBE (Upper Arm Bike)  L3: 2.5 min forward, 2 min backward.       Shoulder Exercises: Stretch   Other Shoulder Stretches  mid-level doorway stretch x 2 reps; low doorway stretch with elbows straight x 30 sec x 2 reps       Electrical Stimulation   Electrical Stimulation Location  bilat cervical paraspinals and upper trap     Electrical Stimulation Action  IFC    Electrical Stimulation Parameters  intensity to pt tolerance x  20 min    Electrical Stimulation Goals  Tone;Pain      Neck Exercises: Stretches   Upper Trapezius Stretch  Right;Left;3 reps;10 seconds    Other Neck Stretches  rotation Lt/Rt x 5 reps each side.                   PT Long Term Goals - 01/21/19 1537      PT LONG TERM GOAL #1   Title  Improve posture and alignment with patient to demonstrate more upright posture/position of head over chest/shoudlers allowing improved cervical positioning and improved movement quality thus decreasing pain and improving function 02/18/2019    Time  6    Period  Weeks    Status  Partially Met      PT LONG TERM GOAL #2   Title  Increase cervical ROM in lateral flexion and rotation by 5-8 degrees 02/18/2019    Time  6    Period  Weeks    Status  Partially Met      PT LONG TERM GOAL #3   Title  Patient to report decreased stiffness with movement and improved tolerance for sitting to 10-15 min with minimal to no increase in pain 02/18/2019    Time  6    Period  Weeks    Status  Achieved       PT LONG TERM GOAL #4   Title  Independent in HEP with good program to improve mobility 02/18/2019    Time  6    Period  Weeks    Status  Achieved      PT LONG TERM GOAL #5   Title  Improve FOTO to </= 42% limitation 02/18/2019    Time  6    Period  Weeks    Status  Achieved            Plan - 01/21/19 1538    Clinical Impression Statement  Pt's cervical ROM continues to be limited, but is no longer painful. He has improved cervical ext ROM.  Pt tolerated all exercises well, with min to no increase in discomfort.  Pt has partially met her goals and requests to hold therapy while he works on his HEP.      Rehab Potential  Good    PT Frequency  2x / week    PT Duration  6 weeks    PT Treatment/Interventions  ADLs/Self Care Home Management;Patient/family education;Cryotherapy;Electrical Stimulation;Iontophoresis 50m/ml Dexamethasone;Moist Heat;Traction;Ultrasound;Neuromuscular re-education;Dry needling;Manual techniques;Therapeutic activities;Therapeutic exercise;Passive range of motion    PT Next Visit Plan  spoke to supervising PT; will hold until 02/19/19 and will d/c then if pt doesn't return.     PT Home Exercise Plan  Access Code: [email or web address]    Consulted and Agree with Plan of Care  Patient       Patient will benefit from skilled therapeutic intervention in order to improve the following deficits and impairments:  Postural dysfunction, Improper body mechanics, Pain, Increased fascial restricitons, Increased muscle spasms, Hypomobility, Decreased range of motion, Decreased mobility, Decreased activity tolerance  Visit Diagnosis: Cervicalgia  Other symptoms and signs involving the musculoskeletal system  Abnormal posture     Problem List Patient Active Problem List   Diagnosis Date Noted  . DNR (do not resuscitate) 07/24/2018  . 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 Lateral epicondylitis of right elbow 01/21/2018  . Thyroid nodule 11/16/2017  . Carotid  arterial disease ([place] 11/14/2017  . Seborrheic keratoses 11/07/2016  . Acute  medial meniscus tear of left knee 05/15/2016  . Internal hemorrhoids 04/12/2015  . 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 Essential hypertension 08/11/2013  . Controlled type 2 diabetes mellitus without complication, without long-term current use of insulin ([place]) 12/17/2012  . History of nephrolithiasis 12/17/2012  . Vitamin D deficiency 12/17/2012  . Excessive cerumen in ear canal 12/17/2012  . H/O degenerative disc disease 12/17/2012  . 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 Family history of colon cancer 12/17/2012  . Hyperlipidemia 12/12/2012   Kerin Perna, PTA 01/21/19 3:43 PM  [place], [place], [address] Phone: [phone]   Fax:  [phone]  Name: Elizabeth Haff MRN: [id number] Date of Birth: 1945-12-02  PHYSICAL THERAPY DISCHARGE SUMMARY  Visits from Start of Care: 6  Current functional level related to goals / functional outcomes: See progress note for discharge status. Kevin Wood was doing well at last visit and reported that he had returned to all normal functional activities with no pain or limitations.   Remaining deficits: No known deficits    Education / Equipment: HEP  Plan: Patient agrees to discharge.  Patient goals were met. Patient is being discharged due to meeting the stated rehab goals.  ?????    [place], PT, MPH  Acute Rehab [phone]

## 2019-01-22 ENCOUNTER — Ambulatory Visit: Payer: Medicare HMO | Admitting: Family Medicine

## 2019-03-18 ENCOUNTER — Telehealth: Payer: Self-pay | Admitting: Family Medicine

## 2019-03-18 DIAGNOSIS — M545 Low back pain, unspecified: Secondary | ICD-10-CM

## 2019-03-18 DIAGNOSIS — M5030 Other cervical disc degeneration, unspecified cervical region: Secondary | ICD-10-CM

## 2019-03-18 DIAGNOSIS — M4306 Spondylolysis, lumbar region: Secondary | ICD-10-CM

## 2019-03-18 DIAGNOSIS — M542 Cervicalgia: Secondary | ICD-10-CM

## 2019-03-18 NOTE — Telephone Encounter (Signed)
Patient notes he is having relapse of neck and back pain and would like a referral to physical therapy.  He has had benefit with this in the past.  Patient is already been tentatively scheduled for tomorrow and referral has been placed now.

## 2019-03-19 ENCOUNTER — Encounter: Payer: Self-pay | Admitting: Rehabilitative and Restorative Service Providers"

## 2019-03-19 ENCOUNTER — Ambulatory Visit: Payer: Medicare HMO | Admitting: Rehabilitative and Restorative Service Providers"

## 2019-03-19 ENCOUNTER — Other Ambulatory Visit: Payer: Self-pay

## 2019-03-19 DIAGNOSIS — R293 Abnormal posture: Secondary | ICD-10-CM

## 2019-03-19 DIAGNOSIS — R29898 Other symptoms and signs involving the musculoskeletal system: Secondary | ICD-10-CM

## 2019-03-19 DIAGNOSIS — M542 Cervicalgia: Secondary | ICD-10-CM | POA: Diagnosis not present

## 2019-03-19 NOTE — Patient Instructions (Signed)
Access Code: [email or web address]  URL: [email or web address]  Date: 03/19/2019  Prepared by: Gillermo Murdoch   Exercises  Seated Cervical Retraction - 10 reps - 1 sets - 3x daily - 7x weekly  Standing Scapular Retraction - 10 reps - 1 sets - 10 hold - 3x daily - 7x weekly  Shoulder External Rotation and Scapular Retraction - 10 reps - 1 sets - hold - 3x daily - 7x weekly  Standing Scapular Retraction in Abduction - 10 reps - 1 sets - 3x daily - 7x weekly  Doorway Pec Stretch at 60 Degrees Abduction - 3 reps - 1 sets - 3x daily - 7x weekly  Doorway Pec Stretch at 90 Degrees Abduction - 3 reps - 1 sets - 30 seconds hold - 3x daily - 7x weekly  Doorway Pec Stretch at 120 Degrees Abduction - 3 reps - 1 sets - 30 second hold hold - 3x daily - 7x weekly

## 2019-03-19 NOTE — Therapy (Signed)
[place], [place], [address] Phone: [phone]   Fax:  [phone]  Physical Therapy Evaluation  Patient Details  Name: Kevin Wood MRN: [id number] Date of Birth: 05-May-1946 Referring Provider (PT): Dr Kevin Wood    Encounter Date: 03/19/2019    Past Medical History:  Diagnosis Date  . Elevated blood pressure 12/12/2012  . H/O degenerative disc disease 12/17/2012  . History of nephrolithiasis 12/17/2012  . Hyperlipidemia 12/12/2012  . Lumbar vertebral fracture ([place]) [place]  . Type 2 diabetes mellitus ([place]) 12/17/2012  . Unspecified vitamin D deficiency 12/17/2012   Outside Records     Past Surgical History:  Procedure Laterality Date  . c spine fustion  1994  . colon cancer  father  . GALLBLADDER SURGERY  2012    There were no vitals filed for this visit.   Subjective Assessment - 03/19/19 0817    Subjective  Has had pain in the neck and back for the past 3-4 weeks. He feels symptoms have increased due to decreased activity with the gym being closed. Scheduled for MRI for lumbar spine Monday 03/24/19    Pertinent History  AODM; DDD; arthritis    Diagnostic tests  Xrays     Patient Stated Goals  improve pain and be able to turn head     Currently in Pain?  Yes    Pain Score  5     Pain Location  Neck    Pain Orientation  Right    Pain Descriptors / Indicators  Sharp    Pain Type  Chronic pain;Acute pain    Pain Onset  1 to 4 weeks ago    Pain Frequency  Constant    Aggravating Factors   worse at night; lying down; turning head to side; looking up     Pain Relieving Factors  meds         [facility] PT Assessment - 03/19/19 0001      Assessment   Medical Diagnosis  Cervical dysfunction     Referring Provider (PT)  Dr Kevin Wood     Onset Date/Surgical Date  02/18/19    Hand Dominance  Left    Next MD Visit  PRN     Prior Therapy  yes here 1/19-2/19; 2/20-3/20       Precautions   Precautions  None      Balance Screen   Has the patient fallen in the past 6 months  No    Has the patient had a decrease in activity level because of a fear of falling?   No    Is the patient reluctant to leave their home because of a fear of falling?   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     Leisure  was in the gym 4x/wk; walking 1 mile a day; cooking; cleaning; laundry      Observation/Other Assessments   Focus on Therapeutic Outcomes (FOTO)   56% limitation       Sensation   Additional Comments  WFL's per pt report       Posture/Postural Control   Posture Comments  head forward; shoulders rounded and elevated; increased thoracic kyphosis      AROM   Cervical Flexion  42    Cervical Extension  23 pain Rt     Cervical - Right Side Bend  10 pain Rt     Cervical - Left Side Bend  15 pain Rt     Cervical - Right Rotation  21 pain Rt    Cervical - Left Rotation  19 pain Rt       Strength   Overall Strength Comments  WFL's bilat UE's       Palpation   Spinal mobility  hypomobility upper thoracic and lower cervical with CPA mobs - fusion mid cervical spine     Palpation comment  muscular tightness thorugh the ant/lat/post cervical musculature; pecs; upper trap; leveator; teres; cervical and thoracic paraspinals                 Objective measurements completed on examination: See above findings.      [facility] Adult PT Treatment/Exercise - 03/19/19 0001      Shoulder Exercises: Standing   Other Standing Exercises  axial extension 10 sec x 5; scap squeeze 10 sec x 5; L's x 10; W's x 10 with noodle       Shoulder Exercises: Stretch   Other Shoulder Stretches  3 position doorway stretch 15-20 sec x 2 reps each position       Moist Heat Therapy   Number Minutes Moist Heat  15 Minutes    Moist Heat Location   Cervical;Shoulder      Electrical Stimulation   Electrical Stimulation Location  Rt cervical paraspinals and upper trap     Electrical Stimulation Action  IFC    Electrical Stimulation Parameters  to tolerance    Electrical Stimulation Goals  Tone;Pain      Manual Therapy   Manual therapy comments  pt in prone     Soft tissue mobilization  STM to bilat cervical and thoracic paraspinals; upper traps; leveator    Myofascial Release  posterior cervical and thoracic              PT Education - 03/19/19 0834    Education Details  HEP; POC    Person(s) Educated  Patient    Methods  Explanation;Demonstration;Tactile cues;Verbal cues;Handout    Comprehension  Verbalized understanding;Returned demonstration;Verbal cues required;Tactile cues required          PT Long Term Goals - 03/19/19 0841      PT LONG TERM GOAL #1   Title  Improve posture and alignment with patient to demonstrate more upright posture/position of head over chest/shoudlers allowing improved cervical positioning and improved movement quality thus decreasing pain and improving function 04/29/2019    Time  6    Period  Weeks    Status  New      PT LONG TERM GOAL #2   Title  Increase cervical ROM in lateral flexion and rotation by 5-8 degrees 04/29/2019    Time  6    Period  Weeks    Status  New      PT LONG TERM GOAL #3   Title  Patient to report decreased stiffness with movement and improved tolerance for sitting to 10-15 min with minimal to no increase in pain 04/29/2019    Time  6    Period  Weeks    Status  New      PT LONG TERM GOAL #4   Title  Independent in HEP with good program to improve mobility 04/29/2019    Time  6    Period  Weeks    Status  New      PT LONG TERM GOAL #5   Title  Improve FOTO to </= 43% limitation 02/18/2019    Time  6    Period  Weeks    Status  New             Plan - 03/19/19 5409    Clinical Impression Statement  Kevin Wood presents with cervical pain and  dysfunction with flare up of symptoms in the past 3-4 weeks which is likely due to decreased activity level including no longer exercising. Patient has poor posture and alignment; limited thoracic and cervical ROM; muscular tightness to palpation through cervical and thoracic spine and Rt > Lt upper quarter. Patient will benefit from PT to address problems identified.     Rehab Potential  Good    PT Frequency  2x / week    PT Duration  6 weeks    PT Treatment/Interventions  ADLs/Self Care Home Management;Patient/family education;Cryotherapy;Electrical Stimulation;Iontophoresis 4mg /ml Dexamethasone;Moist Heat;Traction;Ultrasound;Neuromuscular re-education;Dry needling;Manual techniques;Therapeutic activities;Therapeutic exercise;Passive range of motion    PT Next Visit Plan  review HEP; work on thoracic extension; spinal mobs; manual work thoracic and cervical spine; posterior shoulder girdle strengthening; modaliites as indicated     PT Home Exercise Plan  Access Code: [email or web address]     Consulted and Agree with Plan of Care  Patient       Patient will benefit from skilled therapeutic intervention in order to improve the following deficits and impairments:  Postural dysfunction, Improper body mechanics, Pain, Increased fascial restricitons, Increased muscle spasms, Hypomobility, Decreased range of motion, Decreased mobility, Decreased activity tolerance  Visit Diagnosis: Cervicalgia - Plan: PT plan of care cert/re-cert  Other symptoms and signs involving the musculoskeletal system - Plan: PT plan of care cert/re-cert  Abnormal posture - Plan: PT plan of care cert/re-cert     Problem List Patient Active Problem List   Diagnosis Date Noted  . DNR (do not resuscitate) 07/24/2018  . 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 Lateral epicondylitis of right elbow 01/21/2018  . Thyroid nodule 11/16/2017  . Carotid arterial disease ([place]) 11/14/2017  . Seborrheic keratoses 11/07/2016  . Acute  medial meniscus tear of left knee 05/15/2016  . Internal hemorrhoids 04/12/2015  . 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 Essential hypertension 08/11/2013  . Controlled type 2 diabetes mellitus without complication, without long-term current use of insulin ([place]) 12/17/2012  . History of nephrolithiasis 12/17/2012  . Vitamin D deficiency 12/17/2012  . Excessive cerumen in ear canal 12/17/2012  . H/O degenerative disc disease 12/17/2012  . 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 Family history of colon cancer 12/17/2012  . Hyperlipidemia 12/12/2012    Kevin Wood Nilda Simmer PT, MPH  03/19/2019, 11:50 AM  [facility] [place], [place], [address] Phone: [phone]   Fax:  [phone]  Name: Kevin Wood MRN: [id number] Date of Birth: 20-Jan-1946

## 2019-03-23 ENCOUNTER — Telehealth: Payer: Self-pay

## 2019-03-23 ENCOUNTER — Telehealth: Payer: Self-pay | Admitting: Family Medicine

## 2019-03-23 NOTE — Telephone Encounter (Signed)
If he has an acute hemorrhoid that just popped up we can do it now.  However if it is a chronic issue then we should get him to a Education officer, environmental.  Please inquire.  If acute hemorrhoid that has thrombosed we can treat it right now.

## 2019-03-23 NOTE — Telephone Encounter (Signed)
Patient states this is chronic and current hemorrhoid is very bothersome to him and bleeding a lot. States he will discuss further with provider at his appt tomorrow

## 2019-03-23 NOTE — Telephone Encounter (Signed)
Patient states he has been feeling okay usually, but having spell of getting sweaty and exhausted quickly. Patient states he think it could be his blood pressure, but reading this AM was 135/50.   Patient scheduled appt for tomorrow at 10:10.

## 2019-03-23 NOTE — Telephone Encounter (Signed)
Pt called. He states he is feeling weak and clammy.. Thanks

## 2019-03-23 NOTE — Telephone Encounter (Signed)
Pt called requesting referral be placed for local provider in [place] that can remove hemorrhoid for him. ( it appears he was referred to Dr Tora Duck in 2016).

## 2019-03-24 ENCOUNTER — Ambulatory Visit (INDEPENDENT_AMBULATORY_CARE_PROVIDER_SITE_OTHER): Payer: Medicare HMO | Admitting: Family Medicine

## 2019-03-24 ENCOUNTER — Other Ambulatory Visit: Payer: Self-pay

## 2019-03-24 ENCOUNTER — Encounter: Payer: Self-pay | Admitting: Family Medicine

## 2019-03-24 ENCOUNTER — Encounter: Payer: Self-pay | Admitting: Rehabilitative and Restorative Service Providers"

## 2019-03-24 ENCOUNTER — Ambulatory Visit: Payer: Medicare HMO | Admitting: Rehabilitative and Restorative Service Providers"

## 2019-03-24 VITALS — BP 103/66 | HR 74 | Ht 71.0 in | Wt 215.0 lb

## 2019-03-24 DIAGNOSIS — R42 Dizziness and giddiness: Secondary | ICD-10-CM | POA: Diagnosis not present

## 2019-03-24 DIAGNOSIS — I779 Disorder of arteries and arterioles, unspecified: Secondary | ICD-10-CM

## 2019-03-24 DIAGNOSIS — R5383 Other fatigue: Secondary | ICD-10-CM

## 2019-03-24 DIAGNOSIS — K644 Residual hemorrhoidal skin tags: Secondary | ICD-10-CM | POA: Diagnosis not present

## 2019-03-24 DIAGNOSIS — E119 Type 2 diabetes mellitus without complications: Secondary | ICD-10-CM

## 2019-03-24 DIAGNOSIS — I739 Peripheral vascular disease, unspecified: Secondary | ICD-10-CM | POA: Diagnosis not present

## 2019-03-24 DIAGNOSIS — R293 Abnormal posture: Secondary | ICD-10-CM | POA: Diagnosis not present

## 2019-03-24 DIAGNOSIS — N400 Enlarged prostate without lower urinary tract symptoms: Secondary | ICD-10-CM

## 2019-03-24 DIAGNOSIS — R29898 Other symptoms and signs involving the musculoskeletal system: Secondary | ICD-10-CM | POA: Diagnosis not present

## 2019-03-24 DIAGNOSIS — M542 Cervicalgia: Secondary | ICD-10-CM

## 2019-03-24 DIAGNOSIS — E782 Mixed hyperlipidemia: Secondary | ICD-10-CM | POA: Diagnosis not present

## 2019-03-24 DIAGNOSIS — I10 Essential (primary) hypertension: Secondary | ICD-10-CM | POA: Diagnosis not present

## 2019-03-24 DIAGNOSIS — E041 Nontoxic single thyroid nodule: Secondary | ICD-10-CM

## 2019-03-24 LAB — POCT GLYCOSYLATED HEMOGLOBIN (HGB A1C): Hemoglobin A1C: 6.9 % — AB (ref 4.0–5.6)

## 2019-03-24 NOTE — Patient Instructions (Addendum)
Thank you for coming in today. Get labs now.  Keep track of blood pressure and sugar during episodes.  Likely next step is referral to cardiology.  I will await lab results.  Keep me updated.   Call or go to the emergency room if you get worse, have trouble breathing, have chest pains, or palpitations.   You should hear about gastroenterology soon.    Palpitations Palpitations are feelings that your heartbeat is irregular or is faster than normal. It may feel like your heart is fluttering or skipping a beat. Palpitations are usually not a serious problem. They may be caused by many things, including smoking, caffeine, alcohol, stress, and certain medicines or drugs. Most causes of palpitations are not serious. However, some palpitations can be a sign of a serious problem. You may need further tests to rule out serious medical problems. Follow these instructions at home:     Pay attention to any changes in your condition. Take these actions to help manage your symptoms: Eating and drinking  Avoid foods and drinks that may cause palpitations. These may include: ? Caffeinated coffee, tea, soft drinks, diet pills, and energy drinks. ? Chocolate. ? Alcohol. Lifestyle  Take steps to reduce your stress and anxiety. Things that can help you relax include: ? Yoga. ? Mind-body activities, such as deep breathing, meditation, or using words and images to create positive thoughts (guided imagery). ? Physical activity, such as swimming, jogging, or walking. Tell your health care provider if your palpitations increase with activity. If you have chest pain or shortness of breath with activity, do not continue the activity until you are seen by your health care provider. ? Biofeedback. This is a method that helps you learn to use your mind to control things in your body, such as your heartbeat.  Do not use drugs, including cocaine or ecstasy. Do not use marijuana.  Get plenty of rest and sleep.  Keep a regular bed time. General instructions  Take over-the-counter and prescription medicines only as told by your health care provider.  Do not use any products that contain nicotine or tobacco, such as cigarettes and e-cigarettes. If you need help quitting, ask your health care provider.  Keep all follow-up visits as told by your health care provider. This is important. These may include visits for further testing if palpitations do not go away or get worse. Contact a health care provider if you:  Continue to have a fast or irregular heartbeat after 24 hours.  Notice that your palpitations occur more often. Get help right away if you:  Have chest pain or shortness of breath.  Have a severe headache.  Feel dizzy or you faint. Summary  Palpitations are feelings that your heartbeat is irregular or is faster than normal. It may feel like your heart is fluttering or skipping a beat.  Palpitations may be caused by many things, including smoking, caffeine, alcohol, stress, certain medicines, and drugs.  Although most causes of palpitations are not serious, some causes can be a sign of a serious medical problem.  Get help right away if you faint or have chest pain, shortness of breath, a severe headache, or dizziness. This information is not intended to replace advice given to you by your health care provider. Make sure you discuss any questions you have with your health care provider. Document Released: 10/19/2000 Document Revised: 12/04/2017 Document Reviewed: 12/04/2017 Elsevier Interactive Patient Education  2019 [employer].

## 2019-03-24 NOTE — Progress Notes (Signed)
Kevin Wood is a 73 y.o. male who presents to [facility] today for fatigue.  Patient developed lightheadedness and fatigue associated with sweating and dizziness with exertion.  This happened about a week and a half ago.  He felt better after resting.  2 days ago it happened again this time at rest.  He denies palpitations during this time vomiting or diarrhea.  He notes his blood pressure has been typically pretty well controlled at home 120s to 130s over 60s and 70s.  He notes his blood sugar is typically also pretty well controlled with no low episodes.  Denies any medication changes.  He feels pretty well otherwise.  Patient also notes hemorrhoids.  He has had ongoing hemorrhoids in the past.  He has had banded internal hemorrhoids in the past as well as some external hemorrhoids.  He notes he is having recurrent internal hemorrhoids and sometimes external hemorrhoids now.  He like to refer back to gastroenterology for further evaluation and management of this issue if possible.  ROS as above:  Exam:  BP 103/66   Pulse 74   Ht 5\' 11"  (1.803 m)   Wt 215 lb (97.5 kg)   SpO2 99%   BMI 29.99 kg/m   Orthostatic VS for the past 24 hrs:  BP- Lying Pulse- Lying BP- Sitting Pulse- Sitting BP- Standing at 0 minutes Pulse- Standing at 0 minutes  03/24/19 1023 116/75 68 104/70 70 128/68 78    Wt Readings from Last 5 Encounters:  03/24/19 215 lb (97.5 kg)  12/31/18 219 lb (99.3 kg)  07/24/18 226 lb (102.5 kg)  07/17/18 221 lb (100.2 kg)  01/21/18 217 lb (98.4 kg)    Gen: Well NAD nontoxic appearing HEENT: EOMI,  MMM Lungs: Normal work of breathing. CTABL Heart: RRR no MRG Abd: NABS, Soft. Nondistended, Nontender Exts: Brisk capillary refill, warm and well perfused.     Lab and Radiology Results Results for orders placed or performed in visit on 03/24/19 (from  the past 72 hour(s))  POCT HgB A1C     Status: Abnormal   Collection Time: 03/24/19  9:49 AM  Result Value Ref Range   Hemoglobin A1C 6.9 (A) 4.0 - 5.6 %   HbA1c POC (<> result, manual entry)     HbA1c, POC (prediabetic range)     HbA1c, POC (controlled diabetic range)     Twelve-lead EKG shows normal sinus rhythm at 66 bpm.  No ST segment elevation or depression.  Normal EKG.    Assessment and Plan: 73 y.o. male with  Fatigue.  Associated with exertion.  Fundamental etiology is unclear.  Plan for metabolic work-up listed below.  EKG normal.  Following lab results probably will refer back to cardiology.  May benefit from stress test and potential Holter monitor.  Additionally patient has persistent recurrent hemorrhoid.  Plan for referral to gastroenterology for reevaluation of hemorrhoids.  Patient has benefited from banding in the past.  PDMP not reviewed this encounter. Orders Placed This Encounter  Procedures  . CBC  . COMPLETE METABOLIC PANEL WITH GFR  . LDL cholesterol, direct  . PSA  . TSH  . Ambulatory referral to Gastroenterology    Referral Priority:   Routine    Referral Type:   Consultation    Referral Reason:   Specialty Services Required    Number of Visits Requested:   1  . POCT HgB A1C  . EKG 12-Lead  No orders of the defined types were placed in this encounter.    Historical information moved to improve visibility of documentation.  Past Medical History:  Diagnosis Date  . Elevated blood pressure 12/12/2012  . H/O degenerative disc disease 12/17/2012  . History of nephrolithiasis 12/17/2012  . Hyperlipidemia 12/12/2012  . Lumbar vertebral fracture ([place]) [place]  . Type 2 diabetes mellitus ([place]) 12/17/2012  . Unspecified vitamin D deficiency 12/17/2012   Outside Records    Past Surgical History:  Procedure Laterality Date  . c spine fustion  1994  . colon cancer  father  . GALLBLADDER SURGERY  2012   Social History   Tobacco Use  .  Smoking status: Never Smoker  . Smokeless tobacco: Never Used  Substance Use Topics  . Alcohol use: Yes    Alcohol/week: 1.0 standard drinks    Types: 1 Standard drinks or equivalent per week   family history includes Cancer in his father.  Medications: Current Outpatient Medications  Medication Sig Dispense Refill  . lisinopril-hydrochlorothiazide (PRINZIDE,ZESTORETIC) 20-12.5 MG tablet Take 1 tablet by mouth daily. 90 tablet 3  . methocarbamol (ROBAXIN) 500 MG tablet Take 1 tablet (500 mg total) by mouth every 8 (eight) hours as needed for muscle spasms. 90 tablet 0  . simvastatin (ZOCOR) 40 MG tablet Take 1 tablet (40 mg total) by mouth daily. 90 tablet 3   No current facility-administered medications for this visit.    No Known Allergies   Discussed warning signs or symptoms. Please see discharge instructions. Patient expresses understanding.

## 2019-03-24 NOTE — Therapy (Signed)
[place], [place], [address] Phone: [phone]   Fax:  [phone]  Physical Therapy Treatment  Patient Details  Name: Kevin Wood MRN: [id number] Date of Birth: 1946/04/02 Referring Provider (PT): Dr Lynne Leader    Encounter Date: 03/24/2019  PT End of Session - 03/24/19 0803    Visit Number  2    Number of Visits  12    Date for PT Re-Evaluation  04/30/19    PT Start Time  0802    PT Stop Time  0903    PT Time Calculation (min)  61 min    Activity Tolerance  Patient tolerated treatment well       Past Medical History:  Diagnosis Date  . Elevated blood pressure 12/12/2012  . H/O degenerative disc disease 12/17/2012  . History of nephrolithiasis 12/17/2012  . Hyperlipidemia 12/12/2012  . Lumbar vertebral fracture ([place]) [place]  . Type 2 diabetes mellitus ([place]) 12/17/2012  . Unspecified vitamin D deficiency 12/17/2012   Outside Records     Past Surgical History:  Procedure Laterality Date  . c spine fustion  1994  . colon cancer  father  . GALLBLADDER SURGERY  2012    There were no vitals filed for this visit.  Subjective Assessment - 03/24/19 0806    Subjective  Has been working on his exercises four times a day - still hurts.     Currently in Pain?  Yes    Pain Score  7     Pain Location  Neck    Pain Orientation  Right    Pain Descriptors / Indicators  Sharp    Pain Type  Chronic pain;Acute pain                       [facility] Adult PT Treatment/Exercise - 03/24/19 0001      Shoulder Exercises: Prone   Other Prone Exercises  axial extension 2-3 sec x 5 reps       Shoulder Exercises: Standing   Other Standing Exercises  axial extension 10 sec x 5; scap squeeze 10 sec x 5; L's x 10; W's x 10 with noodle       Shoulder Exercises: ROM/Strengthening   UBE (Upper Arm Bike)  L2 x 3 min (1.5 fwd/1.5 back)       Shoulder Exercises: Stretch   Other  Shoulder Stretches  3 position doorway stretch 15-20 sec x 2 reps each position       Moist Heat Therapy   Number Minutes Moist Heat  15 Minutes    Moist Heat Location  Cervical;Shoulder      Electrical Stimulation   Electrical Stimulation Location  Rt cervical paraspinals and upper trap     Electrical Stimulation Action  IFC    Electrical Stimulation Parameters  to tolerance    Electrical Stimulation Goals  Tone;Pain      Manual Therapy   Manual therapy comments  pt in prone     Soft tissue mobilization  STM to bilat cervical and thoracic paraspinals; upper traps; leveator    Myofascial Release  posterior cervical and thoracic        Trigger Point Dry Needling - 03/24/19 0001    Consent Given?  Yes    Education Handout Provided  Yes    Muscles Treated Head and Neck  Upper trapezius;Scalenes;Splenius capitus;Cervical multifidi    Dry Needling Comments  pt prone Rt side needled  Upper Trapezius Response  Palpable increased muscle length    Scalenes Response  Palpable increased muscle length    Splenius capitus Response  Palpable increased muscle length    Cervical multifidi Response  Palpable increased muscle length           PT Education - 03/24/19 0817    Education Details  HEP DN     Person(s) Educated  Patient    Methods  Explanation;Verbal cues;Handout;Tactile cues    Comprehension  Verbalized understanding;Returned demonstration;Verbal cues required;Tactile cues required          PT Long Term Goals - 03/19/19 0841      PT LONG TERM GOAL #1   Title  Improve posture and alignment with patient to demonstrate more upright posture/position of head over chest/shoudlers allowing improved cervical positioning and improved movement quality thus decreasing pain and improving function 04/29/2019    Time  6    Period  Weeks    Status  New      PT LONG TERM GOAL #2   Title  Increase cervical ROM in lateral flexion and rotation by 5-8 degrees 04/29/2019    Time  6     Period  Weeks    Status  New      PT LONG TERM GOAL #3   Title  Patient to report decreased stiffness with movement and improved tolerance for sitting to 10-15 min with minimal to no increase in pain 04/29/2019    Time  6    Period  Weeks    Status  New      PT LONG TERM GOAL #4   Title  Independent in HEP with good program to improve mobility 04/29/2019    Time  6    Period  Weeks    Status  New      PT LONG TERM GOAL #5   Title  Improve FOTO to </= 43% limitation 02/18/2019    Time  6    Period  Weeks    Status  New            Plan - 03/24/19 0806    Clinical Impression Statement  Persistent neck and back pain - discussed at length sitting positions and recliners specifically. Patient spends a good bit of time in the recliner looking to the right to see TV. Patient tolerated DN well with some decrease in muscular tightness following treatment.     Rehab Potential  Good    PT Frequency  2x / week    PT Duration  6 weeks    PT Treatment/Interventions  ADLs/Self Care Home Management;Patient/family education;Cryotherapy;Electrical Stimulation;Iontophoresis 4mg /ml Dexamethasone;Moist Heat;Traction;Ultrasound;Neuromuscular re-education;Dry needling;Manual techniques;Therapeutic activities;Therapeutic exercise;Passive range of motion    PT Next Visit Plan  review HEP; work on thoracic extension; spinal mobs; manual work thoracic and cervical spine; posterior shoulder girdle strengthening; modaliites as indicated - assess response to DN     PT Home Exercise Plan  Access Code: [email or web address]     Consulted and Agree with Plan of Care  Patient       Patient will benefit from skilled therapeutic intervention in order to improve the following deficits and impairments:  Postural dysfunction, Improper body mechanics, Pain, Increased fascial restricitons, Increased muscle spasms, Hypomobility, Decreased range of motion, Decreased mobility, Decreased activity tolerance  Visit  Diagnosis: Cervicalgia  Other symptoms and signs involving the musculoskeletal system  Abnormal posture     Problem List Patient Active Problem List   Diagnosis Date Noted  .  DNR (do not resuscitate) 07/24/2018  . 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 Lateral epicondylitis of right elbow 01/21/2018  . Thyroid nodule 11/16/2017  . Carotid arterial disease ([place]) 11/14/2017  . Seborrheic keratoses 11/07/2016  . Acute medial meniscus tear of left knee 05/15/2016  . Internal hemorrhoids 04/12/2015  . 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 Essential hypertension 08/11/2013  . Controlled type 2 diabetes mellitus without complication, without long-term current use of insulin ([place]) 12/17/2012  . History of nephrolithiasis 12/17/2012  . Vitamin D deficiency 12/17/2012  . Excessive cerumen in ear canal 12/17/2012  . H/O degenerative disc disease 12/17/2012  . 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 Family history of colon cancer 12/17/2012  . Hyperlipidemia 12/12/2012    Geanette Buonocore Nilda Simmer PT, MPH  03/24/2019, 8:55 AM  [facility] [place], [place], [address] Phone: [phone]   Fax:  [phone]  Name: Kevin Wood MRN: [id number] Date of Birth: 09-Dec-1945

## 2019-03-24 NOTE — Patient Instructions (Addendum)
Trigger Point Dry Needling  . What is Trigger Point Dry Needling (DN)? o DN is a physical therapy technique used to treat muscle pain and dysfunction. Specifically, DN helps deactivate muscle trigger points (muscle knots).  o A thin filiform needle is used to penetrate the skin and stimulate the underlying trigger point. The goal is for a local twitch response (LTR) to occur and for the trigger point to relax. No medication of any kind is injected during the procedure.   . What Does Trigger Point Dry Needling Feel Like?  o The procedure feels different for each individual patient. Some patients report that they do not actually feel the needle enter the skin and overall the process is not painful. Very mild bleeding may occur. However, many patients feel a deep cramping in the muscle in which the needle was inserted. This is the local twitch response.   Marland Kitchen How Will I feel after the treatment? o Soreness is normal, and the onset of soreness may not occur for a few hours. Typically this soreness does not last longer than two days.  o Bruising is uncommon, however; ice can be used to decrease any possible bruising.  o In rare cases feeling tired or nauseous after the treatment is normal. In addition, your symptoms may get worse before they get better, this period will typically not last longer than 24 hours.   . What Can I do After My Treatment? o Increase your hydration by drinking more water for the next 24 hours. o You may place ice or heat on the areas treated that have become sore, however, do not use heat on inflamed or bruised areas. Heat often brings more relief post needling. o You can continue your regular activities, but vigorous activity is not recommended initially after the treatment for 24 hours. o DN is best combined with other physical therapy such as strengthening, stretching, and other therapies.    Shoulder Blade Squeeze: Arms at Sides    Arms at sides, parallel, elbows  straight, palms up. Press pelvis down. Squeeze backbone with shoulder blades, raising front of shoulders, chest, and arms. Keep head and neck neutral. Hold _2-3__ seconds. Relax. Repeat _10__ times.

## 2019-03-25 LAB — COMPLETE METABOLIC PANEL WITH GFR
AG Ratio: 2 (calc) (ref 1.0–2.5)
ALT: 23 U/L (ref 9–46)
AST: 19 U/L (ref 10–35)
Albumin: 4.3 g/dL (ref 3.6–5.1)
Alkaline phosphatase (APISO): 64 U/L (ref 35–144)
BUN: 16 mg/dL (ref 7–25)
CO2: 28 mmol/L (ref 20–32)
Calcium: 9.1 mg/dL (ref 8.6–10.3)
Chloride: 101 mmol/L (ref 98–110)
Creat: 0.93 mg/dL (ref 0.70–1.18)
GFR, Est African American: 95 mL/min/{1.73_m2} (ref >=60)
GFR, Est Non African American: 82 mL/min/{1.73_m2} (ref >=60)
Globulin: 2.1 g/dL (calc) (ref 1.9–3.7)
Glucose, Bld: 156 mg/dL — ABNORMAL HIGH (ref 65–99)
Potassium: 3.8 mmol/L (ref 3.5–5.3)
Sodium: 139 mmol/L (ref 135–146)
Total Bilirubin: 0.5 mg/dL (ref 0.2–1.2)
Total Protein: 6.4 g/dL (ref 6.1–8.1)

## 2019-03-25 LAB — CBC
HCT: 40.7 % (ref 38.5–50.0)
Hemoglobin: 13.9 g/dL (ref 13.2–17.1)
MCH: 29.4 pg (ref 27.0–33.0)
MCHC: 34.2 g/dL (ref 32.0–36.0)
MCV: 86 fL (ref 80.0–100.0)
MPV: 11.3 fL (ref 7.5–12.5)
Platelets: 170 10*3/uL (ref 140–400)
RBC: 4.73 10*6/uL (ref 4.20–5.80)
RDW: 13.2 % (ref 11.0–15.0)
WBC: 4.4 10*3/uL (ref 3.8–10.8)

## 2019-03-25 LAB — PSA: PSA: 0.6 ng/mL (ref <=4.0)

## 2019-03-25 LAB — LDL CHOLESTEROL, DIRECT: Direct LDL: 105 mg/dL — ABNORMAL HIGH (ref <100)

## 2019-03-25 LAB — TSH: TSH: 0.73 mIU/L (ref 0.40–4.50)

## 2019-03-26 ENCOUNTER — Other Ambulatory Visit: Payer: Self-pay

## 2019-03-26 ENCOUNTER — Ambulatory Visit: Payer: Medicare HMO | Admitting: Rehabilitative and Restorative Service Providers"

## 2019-03-26 ENCOUNTER — Encounter: Payer: Self-pay | Admitting: Rehabilitative and Restorative Service Providers"

## 2019-03-26 DIAGNOSIS — R29898 Other symptoms and signs involving the musculoskeletal system: Secondary | ICD-10-CM

## 2019-03-26 DIAGNOSIS — M542 Cervicalgia: Secondary | ICD-10-CM | POA: Diagnosis not present

## 2019-03-26 DIAGNOSIS — R293 Abnormal posture: Secondary | ICD-10-CM

## 2019-03-26 NOTE — Patient Instructions (Signed)
Resisted External Rotation: in Neutral - Bilateral   PALMS UP Sit or stand, tubing in both hands, elbows at sides, bent to 90, forearms forward. Pinch shoulder blades together and rotate forearms out. Keep elbows at sides. Repeat __10__ times per set. Do _2-3___ sets per session. Do _2-3___ sessions per day.   Low Row: Standing   Face anchor, feet shoulder width apart. Palms up, pull arms back, squeezing shoulder blades together. Repeat 10__ times per set. Do 2-3__ sets per session. Do 1-2_ sessions per day   Strengthening: Resisted Extension   Hold tubing in right hand, arm forward. Pull arm back, elbow straight. Repeat _10___ times per set. Do 2-3____ sets per session. Do 1-2___ sessions per day.

## 2019-03-26 NOTE — Therapy (Signed)
[place], [place], [address] Phone: [phone]   Fax:  [phone]  Physical Therapy Treatment  Patient Details  Name: Kevin Wood MRN: [id number] Date of Birth: 1946-07-27 Referring Provider (PT): Dr Lynne Leader    Encounter Date: 03/26/2019  PT End of Session - 03/26/19 0813    Visit Number  2    Number of Visits  12    Date for PT Re-Evaluation  04/30/19    PT Start Time  0801    PT Stop Time  0858    PT Time Calculation (min)  57 min    Activity Tolerance  Patient tolerated treatment well       Past Medical History:  Diagnosis Date  . Elevated blood pressure 12/12/2012  . H/O degenerative disc disease 12/17/2012  . History of nephrolithiasis 12/17/2012  . Hyperlipidemia 12/12/2012  . Lumbar vertebral fracture ([place]) [place]  . Type 2 diabetes mellitus ([place]) 12/17/2012  . Unspecified vitamin D deficiency 12/17/2012   Outside Records     Past Surgical History:  Procedure Laterality Date  . c spine fustion  1994  . colon cancer  father  . GALLBLADDER SURGERY  2012    There were no vitals filed for this visit.  Subjective Assessment - 03/26/19 0813    Subjective  No neck pain - neck is stiff. Modified his recliner at home. Still having LBP and hip pain(chronic in nature)    Currently in Pain?  No/denies    Pain Score  0-No pain    Pain Orientation  Right    Pain Descriptors / Indicators  Tightness    Pain Type  Chronic pain;Acute pain    Pain Frequency  Intermittent    Aggravating Factors   turning head side to side; looking up; neck is better at night     Pain Relieving Factors  meds; DN                        [facility] Adult PT Treatment/Exercise - 03/26/19 0001      Shoulder Exercises: Seated   Other Seated Exercises  lateral cervical flexion stretch with chin tuckes 10 sec x 3 each side       Shoulder Exercises: Prone   Other Prone Exercises  axial  extension 2-3 sec x 5 reps       Shoulder Exercises: Standing   Extension  Strengthening;Both;15 reps;Theraband    Theraband Level (Shoulder Extension)  Level 2 (Red)    Row  Strengthening;Both;15 reps;Theraband    Theraband Level (Shoulder Row)  Level 2 (Red)    Retraction  Strengthening;Both;15 reps;Theraband    Theraband Level (Shoulder Retraction)  Level 2 (Red)    Other Standing Exercises  axial extension 10 sec x 5; scap squeeze 10 sec x 5; L's x 10; W's x 10 with noodle       Shoulder Exercises: ROM/Strengthening   UBE (Upper Arm Bike)  L2 x 4 min (3 fwd/1 back)       Shoulder Exercises: Stretch   Other Shoulder Stretches  3 position doorway stretch 15-20 sec x 2 reps each position       Moist Heat Therapy   Number Minutes Moist Heat  15 Minutes    Moist Heat Location  Cervical;Shoulder;Lumbar Spine      Electrical Stimulation   Electrical Stimulation Location  Rt/Lt cervical paraspinals and Rt upper trap  Electrical Stimulation Action  IFC     Electrical Stimulation Parameters  to tolerance    Electrical Stimulation Goals  Tone;Pain      Manual Therapy   Manual therapy comments  pt in prone     Joint Mobilization  CPA and lateral glides through thoracic spine     Soft tissue mobilization  STM to bilat cervical and thoracic paraspinals; upper traps; leveator    Myofascial Release  posterior cervical and thoracic        Trigger Point Dry Needling - 03/26/19 0001    Consent Given?  Yes    Muscles Treated Head and Neck  Upper trapezius;Scalenes;Splenius capitus;Cervical multifidi    Dry Needling Comments  pt prone Rt side needled     Upper Trapezius Response  Palpable increased muscle length    Scalenes Response  Palpable increased muscle length    Splenius capitus Response  Palpable increased muscle length    Cervical multifidi Response  Palpable increased muscle length           PT Education - 03/26/19 0822    Education Details  HEP     Person(s) Educated   Patient    Methods  Explanation;Demonstration;Tactile cues;Verbal cues;Handout    Comprehension  Verbalized understanding;Returned demonstration;Verbal cues required;Tactile cues required          PT Long Term Goals - 03/19/19 0841      PT LONG TERM GOAL #1   Title  Improve posture and alignment with patient to demonstrate more upright posture/position of head over chest/shoudlers allowing improved cervical positioning and improved movement quality thus decreasing pain and improving function 04/29/2019    Time  6    Period  Weeks    Status  New      PT LONG TERM GOAL #2   Title  Increase cervical ROM in lateral flexion and rotation by 5-8 degrees 04/29/2019    Time  6    Period  Weeks    Status  New      PT LONG TERM GOAL #3   Title  Patient to report decreased stiffness with movement and improved tolerance for sitting to 10-15 min with minimal to no increase in pain 04/29/2019    Time  6    Period  Weeks    Status  New      PT LONG TERM GOAL #4   Title  Independent in HEP with good program to improve mobility 04/29/2019    Time  6    Period  Weeks    Status  New      PT LONG TERM GOAL #5   Title  Improve FOTO to </= 43% limitation 02/18/2019    Time  6    Period  Weeks    Status  New            Plan - 03/26/19 0813    Clinical Impression Statement  Good response to DN with resolution of neck pain - has continued stiffness. Added posterior shoulder girdle strengthening without difficulty. Great response to treatment. Progressing well toward goals.     Rehab Potential  Good    PT Frequency  2x / week    PT Duration  6 weeks    PT Treatment/Interventions  ADLs/Self Care Home Management;Patient/family education;Cryotherapy;Electrical Stimulation;Iontophoresis 4mg /ml Dexamethasone;Moist Heat;Traction;Ultrasound;Neuromuscular re-education;Dry needling;Manual techniques;Therapeutic activities;Therapeutic exercise;Passive range of motion    PT Next Visit Plan  review HEP;  work on thoracic extension; spinal mobs; manual work thoracic and cervical  spine; posterior shoulder girdle strengthening; modaliites as indicated - assess response to DN     PT Home Exercise Plan  Access Code: [email or web address]     Consulted and Agree with Plan of Care  Patient       Patient will benefit from skilled therapeutic intervention in order to improve the following deficits and impairments:  Postural dysfunction, Improper body mechanics, Pain, Increased fascial restricitons, Increased muscle spasms, Hypomobility, Decreased range of motion, Decreased mobility, Decreased activity tolerance  Visit Diagnosis: Cervicalgia  Other symptoms and signs involving the musculoskeletal system  Abnormal posture     Problem List Patient Active Problem List   Diagnosis Date Noted  . DNR (do not resuscitate) 07/24/2018  . 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 Lateral epicondylitis of right elbow 01/21/2018  . Thyroid nodule 11/16/2017  . Carotid arterial disease ([place]) 11/14/2017  . Seborrheic keratoses 11/07/2016  . Acute medial meniscus tear of left knee 05/15/2016  . Internal hemorrhoids 04/12/2015  . 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 Essential hypertension 08/11/2013  . Controlled type 2 diabetes mellitus without complication, without long-term current use of insulin ([place]) 12/17/2012  . History of nephrolithiasis 12/17/2012  . Vitamin D deficiency 12/17/2012  . Excessive cerumen in ear canal 12/17/2012  . H/O degenerative disc disease 12/17/2012  . 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 Family history of colon cancer 12/17/2012  . Hyperlipidemia 12/12/2012     Nilda Simmer PT, MPH  03/26/2019, 8:49 AM  [facility] [place], [place], [address] Phone: [phone]   Fax:  [phone]  Name: Kevin Wood MRN: [id number] Date of Birth: 1946-08-09

## 2019-03-31 ENCOUNTER — Other Ambulatory Visit: Payer: Self-pay

## 2019-03-31 ENCOUNTER — Encounter: Payer: Self-pay | Admitting: Rehabilitative and Restorative Service Providers"

## 2019-03-31 ENCOUNTER — Ambulatory Visit: Payer: Medicare HMO | Admitting: Rehabilitative and Restorative Service Providers"

## 2019-03-31 DIAGNOSIS — R29898 Other symptoms and signs involving the musculoskeletal system: Secondary | ICD-10-CM | POA: Diagnosis not present

## 2019-03-31 DIAGNOSIS — R293 Abnormal posture: Secondary | ICD-10-CM

## 2019-03-31 DIAGNOSIS — M542 Cervicalgia: Secondary | ICD-10-CM | POA: Diagnosis not present

## 2019-03-31 NOTE — Therapy (Signed)
[facility] [place], [place], [address] Phone: [phone]   Fax:  [phone]  Physical Therapy Treatment  Patient Details  Name: Kevin Wood MRN: [id number] Date of Birth: 02/29/1946 Referring Provider (PT): Dr Lynne Leader    Encounter Date: 03/31/2019  PT End of Session - 03/31/19 1402    Visit Number  4    Number of Visits  12    Date for PT Re-Evaluation  04/30/19    PT Start Time  7124    PT Stop Time  1451    PT Time Calculation (min)  49 min    Activity Tolerance  Patient tolerated treatment well       Past Medical History:  Diagnosis Date  . Elevated blood pressure 12/12/2012  . H/O degenerative disc disease 12/17/2012  . History of nephrolithiasis 12/17/2012  . Hyperlipidemia 12/12/2012  . Lumbar vertebral fracture ([place]) [place]  . Type 2 diabetes mellitus ([place]) 12/17/2012  . Unspecified vitamin D deficiency 12/17/2012   Outside Records     Past Surgical History:  Procedure Laterality Date  . c spine fustion  1994  . colon cancer  father  . GALLBLADDER SURGERY  2012    There were no vitals filed for this visit.  Subjective Assessment - 03/31/19 1403    Subjective  Neck is looser - no longer stiff and can turn his head now. He has some soreness in the neck from DN. Exercising 4 times a day. Back still hurts.     Currently in Pain?  Yes    Pain Score  2     Pain Location  Neck    Pain Orientation  Right    Pain Descriptors / Indicators  Tightness    Pain Type  Chronic pain;Acute pain    Pain Onset  More than a month ago    Pain Frequency  Intermittent         [facility] PT Assessment - 03/31/19 0001      Assessment   Medical Diagnosis  Cervical dysfunction     Referring Provider (PT)  Dr Lynne Leader     Onset Date/Surgical Date  02/18/19    Hand Dominance  Left    Next MD Visit  PRN     Prior Therapy  yes here 1/19-2/19; 2/20-3/20       AROM   Cervical Flexion  50     Cervical Extension  39     Cervical - Right Side Bend  17 tight    Cervical - Left Side Bend  17 tight    Cervical - Right Rotation  49    Cervical - Left Rotation  48      Palpation   Spinal mobility  hypomobility upper thoracic and lower cervical with CPA mobs - fusion mid cervical spine     Palpation comment  muscular tightness thorugh the ant/lat/post cervical musculature; pecs; upper trap; leveator; teres; cervical and thoracic paraspinals                    [facility] Adult PT Treatment/Exercise - 03/31/19 0001      Shoulder Exercises: Seated   Other Seated Exercises  lateral cervical flexion stretch with chin tuckes 15 sec x 3 each side     Other Seated Exercises  shoulder rollw fwd/back; lateral cervical flexion w/axial extension with overpresure 10 sec x 2 each direction       Shoulder Exercises: Prone  Other Prone Exercises  axial extension 2-3 sec x 5 reps       Shoulder Exercises: Standing   Extension  Strengthening;Both;15 reps;Theraband    Theraband Level (Shoulder Extension)  Level 3 (Green)    Row  Strengthening;Both;15 reps;Theraband    Theraband Level (Shoulder Row)  Level 3 (Green)    Row Limitations  bow and arrow 10 reps each side green TB     Retraction  Strengthening;Both;15 reps;Theraband    Theraband Level (Shoulder Retraction)  Level 3 (Green)    Other Standing Exercises  axial extension 10 sec x 5; scap squeeze 10 sec x 5; L's x 10; W's x 10 with noodle       Shoulder Exercises: Pulleys   Flexion  --   10 sec hold x 5   Scaption  --   10 sec hold x 5      Shoulder Exercises: Therapy Ball   Flexion Limitations  rolling ball on wall and stepping under to stretch into shoulder flexion 10 sec hold x 5 reps       Shoulder Exercises: ROM/Strengthening   UBE (Upper Arm Bike)  L2 x 4 min (fwd/1 back)       Shoulder Exercises: Stretch   Other Shoulder Stretches  3 position doorway stretch 15-20 sec x 2 reps each position       Moist Heat  Therapy   Number Minutes Moist Heat  15 Minutes    Moist Heat Location  Cervical;Shoulder;Lumbar Spine      Electrical Stimulation   Electrical Stimulation Location  Rt/Lt cervical paraspinals and Rt upper trap     Electrical Stimulation Action  IFC    Electrical Stimulation Parameters  to tolerance    Electrical Stimulation Goals  Tone;Pain                  PT Long Term Goals - 03/19/19 0841      PT LONG TERM GOAL #1   Title  Improve posture and alignment with patient to demonstrate more upright posture/position of head over chest/shoudlers allowing improved cervical positioning and improved movement quality thus decreasing pain and improving function 04/29/2019    Time  6    Period  Weeks    Status  New      PT LONG TERM GOAL #2   Title  Increase cervical ROM in lateral flexion and rotation by 5-8 degrees 04/29/2019    Time  6    Period  Weeks    Status  New      PT LONG TERM GOAL #3   Title  Patient to report decreased stiffness with movement and improved tolerance for sitting to 10-15 min with minimal to no increase in pain 04/29/2019    Time  6    Period  Weeks    Status  New      PT LONG TERM GOAL #4   Title  Independent in HEP with good program to improve mobility 04/29/2019    Time  6    Period  Weeks    Status  New      PT LONG TERM GOAL #5   Title  Improve FOTO to </= 43% limitation 02/18/2019    Time  6    Period  Weeks    Status  New            Plan - 03/31/19 1403    Clinical Impression Statement  Decreased pain to no pain in the cervical spine.  Remains stiff with decreased mobility/ROM but demonatrates great gains in ROM with measurement today. Patient added postural strengthening and AAROM exercises without difficulty today. Progressing well toward rehab goals.     Rehab Potential  Good    PT Frequency  2x / week    PT Duration  6 weeks    PT Treatment/Interventions  ADLs/Self Care Home Management;Patient/family  education;Cryotherapy;Electrical Stimulation;Iontophoresis 4mg /ml Dexamethasone;Moist Heat;Traction;Ultrasound;Neuromuscular re-education;Dry needling;Manual techniques;Therapeutic activities;Therapeutic exercise;Passive range of motion    PT Next Visit Plan  review HEP; trial of IASTM in sitting; work on thoracic extension; spinal mobs; manual work thoracic and cervical spine; posterior shoulder girdle strengthening; modaliites as indicated - assess response to DN     PT Home Exercise Plan  Access Code: [email or web address]     Consulted and Agree with Plan of Care  Patient       Patient will benefit from skilled therapeutic intervention in order to improve the following deficits and impairments:  Postural dysfunction, Improper body mechanics, Pain, Increased fascial restricitons, Increased muscle spasms, Hypomobility, Decreased range of motion, Decreased mobility, Decreased activity tolerance  Visit Diagnosis: Cervicalgia  Other symptoms and signs involving the musculoskeletal system  Abnormal posture     Problem List Patient Active Problem List   Diagnosis Date Noted  . DNR (do not resuscitate) 07/24/2018  . 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 Lateral epicondylitis of right elbow 01/21/2018  . Thyroid nodule 11/16/2017  . Carotid arterial disease ([place]) 11/14/2017  . Seborrheic keratoses 11/07/2016  . Acute medial meniscus tear of left knee 05/15/2016  . Internal hemorrhoids 04/12/2015  . 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 Essential hypertension 08/11/2013  . Controlled type 2 diabetes mellitus without complication, without long-term current use of insulin ([place]) 12/17/2012  . History of nephrolithiasis 12/17/2012  . Vitamin D deficiency 12/17/2012  . Excessive cerumen in ear canal 12/17/2012  . H/O degenerative disc disease 12/17/2012  . 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 Family history of colon cancer 12/17/2012  . Hyperlipidemia 12/12/2012     Holten Spano Nilda Simmer PT, MPH  03/31/2019, 2:49 PM  [facility] [place], [place], [address] Phone: [phone]   Fax:  [phone]  Name: Sopheap Boehle MRN: [id number] Date of Birth: 18-Feb-1946

## 2019-04-01 ENCOUNTER — Encounter: Payer: Self-pay | Admitting: Family Medicine

## 2019-04-01 ENCOUNTER — Telehealth (INDEPENDENT_AMBULATORY_CARE_PROVIDER_SITE_OTHER): Payer: Medicare HMO | Admitting: Family Medicine

## 2019-04-01 VITALS — BP 136/72 | HR 82 | Temp 97.7°F | Wt 215.0 lb

## 2019-04-01 DIAGNOSIS — S39012A Strain of muscle, fascia and tendon of lower back, initial encounter: Secondary | ICD-10-CM

## 2019-04-01 DIAGNOSIS — N281 Cyst of kidney, acquired: Secondary | ICD-10-CM | POA: Diagnosis not present

## 2019-04-01 NOTE — Progress Notes (Addendum)
Virtual Visit  via Video Note  I connected with      Kevin Wood by a video enabled telemedicine application and verified that I am speaking with the correct person using two identifiers.   I discussed the limitations of evaluation and management by telemedicine and the availability of in person appointments. The patient expressed understanding and agreed to proceed.  History of Present Illness: Kevin Wood is a 73 y.o. male who would like to discuss back pain.  Patient has ongoing back pain present for months.  He has had some work-up previous to this already at the [place].  He had an MRI at the [place] a few days ago and was able to get the results himself.  His provider who ordered the MRI has not contacted him yet and he like some interpretation of the MRI results as well as a treatment plan.  He notes he has pain in the left lower back.  This is been ongoing for 2 months and occurs mostly with back motion and leg motion.  He notes he does not have any pain radiating down his leg.  No weakness or numbness or loss of function.  He denies any bowel bladder dysfunction or blood in the urine.   He is currently getting physical therapy treatment for his neck and would be interested in PT for his back as well.  Observations/Objective: BP 136/72   Pulse 82   Temp 97.7 F (36.5 C) (Oral)   Wt 215 lb (97.5 kg)   BMI 29.99 kg/m  Wt Readings from Last 5 Encounters:  04/01/19 215 lb (97.5 kg)  03/24/19 215 lb (97.5 kg)  12/31/18 219 lb (99.3 kg)  07/24/18 226 lb (102.5 kg)  07/17/18 221 lb (100.2 kg)   Exam: Appearance nontoxic no acute distress Normal Speech.    Lab and Radiology Results MRI L-spine results from [place] date of service Mar 15, 2019 reviewed.  Description:  MR lumbar spine   HISTORY: Low back pain   Technique: Multiplanar MR imaging with T1 and T2 weighted spin echo  imaging sequences. . Radiographic correlation from Mar 09, 2019 confirming  5 lumbar vertebrae and  revealing chronic L1 compression fracture stable  from at least June 16, 2013.   Findings:   Vertebrae - mild degenerative subluxations of the T12, L3 and L4  vertebrae. Old L1 compression fracture stable from at least June 16, 2013. No acute fracture. Discogenic endplate changes at multiple levels.  No evidence of osseous destruction.   Spinal cord - conus tip at L1/L2 level. No thickened filum or spinal  lipoma.   Retroperitoneum - abdominal aorta incompletely imaged. Incompletely  imaged T2 hyperintense structure in the right kidney possibly cyst.  Unusual right renal morphology along the coronal localizer series 201,  image 1.   L5/S1: Mild DDD with annular bulge, central posterior annular fissure and  facet arthrosis with osseous hypertrophy produce mild bilateral foraminal  stenosis   L4/L5: Moderate DDD with annular bulge and mild facet arthrosis produce  moderate left lateral recess and foraminal stenosis   L3/L4: Moderate DDD with annular bulge and severe facet arthrosis with  osseous hypertrophy produce mild central spinal stenosis with moderate  left lateral recess stenosis and foraminal stenosis.   L2/L3: Mild DDD and facet arthrosis   L1/L2: Moderate DDD with annular bulge and facet arthrosis   T12/L1: Moderate DDD and mild facet arthrosis     Impression:  1. Moderate lumbar spondylosis  2. Mild central spinal stenosis L3/L4 with moderate left lateral recess  and foraminal stenosis   3. Moderate left lateral recess and foraminal stenosis L4/L5   4. Incompletely imaged T2 hyperintense lesion in the right kidney with  usual but incompletely imaged renal morphologies. Renal sonography  recommended.   5. Mild L1 compression fracture stable from at least June 16, 2013   Electronically Signed By: Aundra Dubin Electronically Signed On: 03/23/2019  12:09 PM  I personally (independently) visualized and performed the interpretation of the images attached  in this note via patient provided CD of images.  US Renal  Result Date: 04/03/2019 CLINICAL DATA:  Acquired right renal cyst. EXAM: RENAL / URINARY TRACT ULTRASOUND COMPLETE COMPARISON:  None. FINDINGS: Right Kidney: Renal measurements: 12.8 x 6.5 x 6.2 cm = volume: 270 mL. 3.3 cm simple cyst is noted in midpole. Echogenicity within normal limits. No mass or hydronephrosis visualized. Left Kidney: Renal measurements: 14.6 x 6.3 x 5.3 cm = volume: 256 mL. Echogenicity within normal limits. No mass or hydronephrosis visualized. Bladder: Appears normal for degree of bladder distention. Calculated prevoid volume of 64 mL. Calculated postvoid volume of 22 mL. Right ureteral jet is visualized. Left ureteral jet is not visualized. Mild prostatic enlargement is noted. IMPRESSION: 3.3 cm simple right renal cyst is noted. No other renal abnormality is noted. Mild prostatic enlargement. Electronically Signed   By: Marijo Conception M.D.   On: 04/03/2019 09:38     Assessment and Plan: 73 y.o. male with back pain.  Patient has degenerative changes of his facet joints throughout lumbar spine.  His pain is mostly confined to his left lower back.  Based on that I think the left L4-L5 and L5-S1 facets are probably the most likely target.  However with his pain worse with motion I think it is more likely myofascial and musculoskeletal.  Plan to proceed with trial physical therapy if not improving neck step would be facet injection at L4-L5 and L5-S1.  Additionally on MRI patient had incidentally noted partially visualized right renal cyst.  Radiologist recommends ultrasound follow-up for this.  We will proceed with renal ultrasound to evaluate the cyst.  Addendum: Ultrasound shows simple cyst.  No need for further follow-up for this issue.  PDMP not reviewed this encounter. Orders Placed This Encounter  Procedures  . US Renal    Standing Status:   Future    Standing Expiration Date:   05/31/2020    Order Specific  Question:   Reason for Exam (SYMPTOM  OR DIAGNOSIS REQUIRED)    Answer:   renal cyst seen on MRI lspine at [place].    Order Specific Question:   Preferred imaging location?    Answer:   [facility]  . Ambulatory referral to Physical Therapy    Referral Priority:   Routine    Referral Type:   Physical Medicine    Referral Reason:   Specialty Services Required    Requested Specialty:   Physical Therapy   No orders of the defined types were placed in this encounter.   Follow Up Instructions:    I discussed the assessment and treatment plan with the patient. The patient was provided an opportunity to ask questions and all were answered. The patient agreed with the plan and demonstrated an understanding of the instructions.   The patient was advised to call back or seek an in-person evaluation if the symptoms worsen or if the condition fails to improve as anticipated.  Time: 25  minutes of intraservice time, with >39 minutes of total time during today's visit.     CC: [facility]: Dr Clerance Lav   Historical information moved to improve visibility of documentation.  Past Medical History:  Diagnosis Date  . Elevated blood pressure 12/12/2012  . H/O degenerative disc disease 12/17/2012  . History of nephrolithiasis 12/17/2012  . Hyperlipidemia 12/12/2012  . Lumbar vertebral fracture ([place]) [place]  . Type 2 diabetes mellitus ([place]) 12/17/2012  . Unspecified vitamin D deficiency 12/17/2012   Outside Records    Past Surgical History:  Procedure Laterality Date  . c spine fustion  1994  . colon cancer  father  . GALLBLADDER SURGERY  2012   Social History   Tobacco Use  . Smoking status: Never Smoker  . Smokeless tobacco: Never Used  Substance Use Topics  . Alcohol use: Yes    Alcohol/week: 1.0 standard drinks    Types: 1 Standard drinks or equivalent per week   family history includes Cancer in his father.  Medications: Current Outpatient Medications  Medication Sig  Dispense Refill  . lisinopril-hydrochlorothiazide (PRINZIDE,ZESTORETIC) 20-12.5 MG tablet Take 1 tablet by mouth daily. 90 tablet 3  . methocarbamol (ROBAXIN) 500 MG tablet Take 1 tablet (500 mg total) by mouth every 8 (eight) hours as needed for muscle spasms. 90 tablet 0  . simvastatin (ZOCOR) 40 MG tablet Take 1 tablet (40 mg total) by mouth daily. 90 tablet 3   No current facility-administered medications for this visit.    No Known Allergies

## 2019-04-01 NOTE — Patient Instructions (Signed)
Thank you for coming in today. You should be able to add back treat to exciting neck PT.  You should alas hear about kidney ultrasound soon.  Keep me updated.  If not better next step is left L4-L5 and  L5-S1 facet injection.    Facet Syndrome  Facet syndrome is a condition in which joints (facet joints) that connect the bones of the spine (vertebrae) become damaged. Facet joints help the spine move, and they usually wear down (degenerate) or become inflamed as you age. This can cause pain and stiffness in the neck (cervical facet syndrome) or in the lower back (lumbar facet syndrome). When a facet joint becomes damaged, a vertebra may slip forward, out of its normal place in the spine. Damage to a facet joint can also damage nerves near the spine, which can cause tingling or weakness in the arms or legs. Facet syndrome can make it difficult to turn the head or bend backward without pain. This condition typically gets worse over time. What are the causes? Common causes of this condition include:  Age-related inflammation of the facet joints (arthritis) that may create extra bone on the joint surface (bone spurs).  Age-related decrease in space between the vertebrae (disk degeneration and cartilage degeneration).  Repetitive stress on the spine, such as repetitive twisting of the back.  Injury (trauma) to the back or neck. What increases the risk? The following factors may make you more likely to develop this condition:  Playing contact sports.  Doing activities or sports that involve repetitive twisting motions or repetitive heavy lifting.  Having poor back strength and flexibility.  Having another back or spine condition, such as scoliosis. What are the signs or symptoms? Symptoms of facet syndrome may include:  An ache in the neck or lower back. This may get worse when you twist or arch your back, or when you look up.  Stiffness in the neck or lower back.  Numbness, tingling,  or weakness in the arms or legs. Symptoms of cervical facet syndrome may include:  Headache.  Pain at the back of the head.  Pain in the shoulder blades. Symptoms of lumbar facet syndrome may include pain in any of the following areas:  Groin.  Thighs.  Lower back.  Buttocks.  Hips. How is this diagnosed? This condition may be diagnosed based on:  Your symptoms.  Your medical history.  A physical exam.  Imaging tests, such as: ? X-rays. ? MRI.  A procedure in which medicines to numb the area (local anesthetic) and medicines to reduce inflammation (steroids) are injected into your affected joint (facet joint block). How is this treated? Treatment for this condition may include:  Stopping or modifying activities that make your condition worse.  Medicines that help reduce pain and inflammation.  Steroid injections to help reduce severe pain.  Physical therapy.  Radiofrequency ablation. This is a surgical procedure that uses high-frequency radio waves to block signals from affected nerves.  Surgery to stabilize your spine or to take pressure off your nerves. This is rare. Follow these instructions at home: Activity  Rest your neck and back as told by your health care provider.  Return to your normal activities as told by your health care provider. Ask your health care provider what activities are safe for you.  If physical therapy was prescribed, do exercises as told by your health care provider. General instructions  Take over-the-counter and prescription medicines only as told by your health care provider.  Do not  drive or operate heavy machinery while taking prescription pain medicines.  Do not use any tobacco products, such as cigarettes, chewing tobacco, and e-cigarettes. Tobacco can delay bone healing. If you need help quitting, ask your health care provider.  Use good posture throughout your daily activities. Good posture means that your spine is in  its natural S-curve position (your spine is neutral), your shoulders are pulled back slightly, and your head is not forward.  Keep all follow-up visits as told by your health care provider. This is important. Contact a health care provider if:  You have symptoms that get worse or do not improve in 2-4 weeks of treatment.  You have numbness or weakness in any part of your body.  You lose control over your bladder or bowel function. This information is not intended to replace advice given to you by your health care provider. Make sure you discuss any questions you have with your health care provider. Document Released: 10/22/2005 Document Revised: 10/31/2017 Document Reviewed: 07/04/2015 Elsevier Interactive Patient Education  2019 [employer].

## 2019-04-02 ENCOUNTER — Ambulatory Visit: Payer: Medicare HMO | Admitting: Rehabilitative and Restorative Service Providers"

## 2019-04-02 ENCOUNTER — Other Ambulatory Visit: Payer: Self-pay

## 2019-04-02 ENCOUNTER — Encounter: Payer: Self-pay | Admitting: Rehabilitative and Restorative Service Providers"

## 2019-04-02 DIAGNOSIS — R293 Abnormal posture: Secondary | ICD-10-CM | POA: Diagnosis not present

## 2019-04-02 DIAGNOSIS — M542 Cervicalgia: Secondary | ICD-10-CM | POA: Diagnosis not present

## 2019-04-02 DIAGNOSIS — G8929 Other chronic pain: Secondary | ICD-10-CM | POA: Diagnosis not present

## 2019-04-02 DIAGNOSIS — R29898 Other symptoms and signs involving the musculoskeletal system: Secondary | ICD-10-CM

## 2019-04-02 DIAGNOSIS — M545 Low back pain: Secondary | ICD-10-CM

## 2019-04-02 NOTE — Therapy (Signed)
[facility] [place] [place], [place], [address] Phone: [phone]   Fax:  [phone]  Physical Therapy Treatment  Patient Details  Name: Kevin Wood MRN: [id number] Date of Birth: 1946/09/22 Referring Provider (PT): Dr Lynne Leader    Encounter Date: 04/02/2019  PT End of Session - 04/02/19 0806    Visit Number  5    Number of Visits  20    Date for PT Re-Evaluation  05/13/19    PT Start Time  0803    PT Stop Time  0910    PT Time Calculation (min)  67 min    Activity Tolerance  Patient tolerated treatment well       Past Medical History:  Diagnosis Date  . Elevated blood pressure 12/12/2012  . H/O degenerative disc disease 12/17/2012  . History of nephrolithiasis 12/17/2012  . Hyperlipidemia 12/12/2012  . Lumbar vertebral fracture ([place]) [place]  . Type 2 diabetes mellitus ([place]) 12/17/2012  . Unspecified vitamin D deficiency 12/17/2012   Outside Records     Past Surgical History:  Procedure Laterality Date  . c spine fustion  1994  . colon cancer  father  . GALLBLADDER SURGERY  2012    There were no vitals filed for this visit.  Subjective Assessment - 04/02/19 0808    Subjective  Neck is stiff today - must have slept wrong. Loosening up now. Neck is not painful. Telehealth visit yesterday with Dr Georgina Snell. Now has an order for his back. Has had back pain for the past two months with no known injury. He has had pain intermittently for years - this is the worst it has been. Aching and stabbing. Some aching in the Lt LE in top of thigh.     Pertinent History  AODM; DDD; arthritis; history of LBP since 1968 with episodic flare ups     Currently in Pain?  No/denies    Pain Score  4    Pain Location  Back    Pain Orientation  Left   sometimes Rt    Pain Descriptors / Indicators  Aching;Stabbing    Pain Type  Chronic pain;Acute pain    Pain Radiating Towards  anterior Lt thigh     Pain Onset  More  than a month ago    Pain Frequency  Intermittent    Aggravating Factors   with lying and getting up from sitting or lying down     Pain Relieving Factors  lying on the floor flat         [facility] PT Assessment - 04/02/19 0001      Assessment   Medical Diagnosis  Cervical dysfunction     Referring Provider (PT)  Dr Lynne Leader     Onset Date/Surgical Date  02/18/19    Hand Dominance  Left    Next MD Visit  PRN     Prior Therapy  yes here 1/19-2/19; 2/20-3/20       AROM   Right/Left Hip  --   significant tightness Lt > Rt ext; rotation    Lumbar Flexion  50% pain in mid low back    Lumbar Extension  20% discomfort mid low back    Lumbar - Right Side Bend  20% discomfort LB    Lumbar - Left Side Bend  15% discomfort LB    Lumbar - Right Rotation  35% tight    Lumbar - Left Rotation  30% tight  Strength   Overall Strength Comments  WFL's bilat LE's; UE's       Flexibility   Hamstrings  Rt 70 deg; Lt 75 deg     Quadriceps  tight Lt > Rt     ITB  tight Lt > Rt     Piriformis  tight Lt >>Rt       Palpation   Spinal mobility  hypomobility lumbar spine as well as upper thoracic and lower cervical with CPA mobs - fusion mid cervical spine     Palpation comment  muscular tightness thorugh the psoas; QL; lumbar paraspinals; lats bilt as well as ant/lat/post cervical musculature; pecs; upper trap; leveator; teres; cervical and thoracic paraspinals                    [facility] Adult PT Treatment/Exercise - 04/02/19 0001      Lumbar Exercises: Stretches   Passive Hamstring Stretch  Right;Left;3 reps;30 seconds   supine with strap    Lower Trunk Rotation  3 reps;20 seconds    Hip Flexor Stretch  Right;Left;3 reps;30 seconds   seated    Quad Stretch  Right;Left;3 reps;30 seconds   prone with strap    ITB Stretch  Right;Left;3 reps;30 seconds   supine with strap    Piriformis Stretch  Right;Left;3 reps;30 seconds   supine travell      Shoulder Exercises:  ROM/Strengthening   UBE (Upper Arm Bike)  L3 x 4 min (fwd/back)       Shoulder Exercises: Stretch   Other Shoulder Stretches  3 position doorway stretch 15-20 sec x 2 reps each position       Moist Heat Therapy   Number Minutes Moist Heat  15 Minutes    Moist Heat Location  Lumbar Spine;Cervical   thoracic spine      Electrical Stimulation   Electrical Stimulation Location  bilat lumbar     Electrical Stimulation Action  IFC    Electrical Stimulation Parameters  to tolerance    Electrical Stimulation Goals  Tone;Pain      Manual Therapy   Manual therapy comments  pt in prone     Joint Mobilization  CPA mobs lumbar spine; PA mobs through SI and hip Lt              PT Education - 04/02/19 3154    Education Details  HEP LB     Person(s) Educated  Patient    Methods  Explanation;Demonstration;Tactile cues;Verbal cues;Handout    Comprehension  Verbalized understanding;Returned demonstration;Verbal cues required;Tactile cues required          PT Long Term Goals - 04/02/19 0839      PT LONG TERM GOAL #1   Title  Improve posture and alignment with patient to demonstrate more upright posture/position of head over chest/shoudlers allowing improved cervical positioning and improved movement quality thus decreasing pain and improving function 05/14/2019    Time  8    Period  Weeks    Status  Revised      PT LONG TERM GOAL #2   Title  Increase cervical ROM in lateral flexion and rotation by 5-8 degrees 05/14/2019    Time  8    Period  Weeks    Status  Revised      PT LONG TERM GOAL #3   Title  Patient to report decreased stiffness with movement and improved tolerance for sitting to 10-15 min with minimal to no increase in pain 05/14/2019  Time  8    Period  Weeks    Status  Revised      PT LONG TERM GOAL #4   Title  Independent in HEP with good program to improve mobility 05/14/2019    Time  8    Period  Weeks    Status  Revised      PT LONG TERM GOAL #5   Title   Improve FOTO to </= 43% limitation 05/14/2019    Time  8    Period  Weeks    Status  Revised      PT LONG TERM GOAL #6   Title  Improve trunk and LE mobility to allow patient to perform functional activities with less pain and greater ease - (ex - putting shoes on) 05/14/2019    Time  6    Period  Weeks    Status  New      PT LONG TERM GOAL #7   Title  Patient to report ability to get up for lying down or sitting with 50-75% less pain 05/14/2019    Time  6    Period  Weeks    Status  New            Plan - 04/02/19 0807    Clinical Impression Statement  Neck continues to be pain free with some stiffness. Back pain has been present for the past two months. Symptoms are worse with lying down and moving from lying or sitting to standing.  He has limited ROM and mobility through the lumbar spine and bilat hips Lt > Rt. He has significant tightness and pain with palpation through the Lt > Rt psoas musculature. Tightness through psoas and hip flexors contribute to LBP. Patient will benefit from PT to address problems identified.     Rehab Potential  Good    PT Frequency  2x / week    PT Duration  6 weeks    PT Treatment/Interventions  ADLs/Self Care Home Management;Patient/family education;Cryotherapy;Electrical Stimulation;Iontophoresis 4mg /ml Dexamethasone;Moist Heat;Traction;Ultrasound;Neuromuscular re-education;Dry needling;Manual techniques;Therapeutic activities;Therapeutic exercise;Passive range of motion    PT Next Visit Plan  review HEP; address LBP with exercise, manual work and modalities as indicated; trial of IASTM in sitting for cervical tightness; work on thoracic extension; spinal mobs; manual work thoracic and cervical spine; posterior shoulder girdle strengthening; modaliites as indicated - assess response to DN     PT Home Exercise Plan  Access Code: [email or web address]     Consulted and Agree with Plan of Care  Patient       Patient will benefit from skilled therapeutic intervention  in order to improve the following deficits and impairments:  Postural dysfunction, Improper body mechanics, Pain, Increased fascial restricitons, Increased muscle spasms, Hypomobility, Decreased range of motion, Decreased mobility, Decreased activity tolerance  Visit Diagnosis: Cervicalgia - Plan: CANCELED: PT plan of care cert/re-cert  Other symptoms and signs involving the musculoskeletal system - Plan: CANCELED: PT plan of care cert/re-cert  Abnormal posture - Plan: CANCELED: PT plan of care cert/re-cert  Chronic right-sided low back pain without sciatica - Plan: CANCELED: PT plan of care cert/re-cert     Problem List Patient Active Problem List   Diagnosis Date Noted  . DNR (do not resuscitate) 07/24/2018  . 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 Lateral epicondylitis of right elbow 01/21/2018  . Thyroid nodule 11/16/2017  . Carotid arterial disease ([place]) 11/14/2017  . Seborrheic keratoses 11/07/2016  . Acute medial meniscus tear of left knee 05/15/2016  .  Internal hemorrhoids 04/12/2015  . 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 Essential hypertension 08/11/2013  . Controlled type 2 diabetes mellitus without complication, without long-term current use of insulin ([place]) 12/17/2012  . History of nephrolithiasis 12/17/2012  . Vitamin D deficiency 12/17/2012  . Excessive cerumen in ear canal 12/17/2012  . H/O degenerative disc disease 12/17/2012  . 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 Family history of colon cancer 12/17/2012  . Hyperlipidemia 12/12/2012    Cabella Kimm Nilda Simmer PT, MPH  04/02/2019, 9:19 AM  [facility] [place], [place], [address] Phone: [phone]   Fax:  [phone]  Name: Kevin Wood MRN: [id number] Date of Birth: 11/28/1945

## 2019-04-02 NOTE — Patient Instructions (Signed)
Access Code: [email or web address]  URL: [email or web address]  Date: 04/02/2019  Prepared by: Gillermo Murdoch   Exercises  Seated Cervical Retraction - 10 reps - 1 sets - 3x daily - 7x weekly  Standing Scapular Retraction - 10 reps - 1 sets - 10 hold - 3x daily - 7x weekly  Shoulder External Rotation and Scapular Retraction - 10 reps - 1 sets - hold - 3x daily - 7x weekly  Standing Scapular Retraction in Abduction - 10 reps - 1 sets - 3x daily - 7x weekly  Doorway Pec Stretch at 60 Degrees Abduction - 3 reps - 1 sets - 3x daily - 7x weekly  Doorway Pec Stretch at 90 Degrees Abduction - 3 reps - 1 sets - 30 seconds hold - 3x daily - 7x weekly  Doorway Pec Stretch at 120 Degrees Abduction - 3 reps - 1 sets - 30 second hold hold - 3x daily - 7x weekly   today Supine Hamstring Stretch with Strap - 10 reps - 1 sets - 30 seconds hold - 2x daily - 7x weekly  Supine ITB Stretch with Strap - 3 reps - 1 sets - 30 seconds hold - 2x daily - 7x weekly  Supine Piriformis Stretch with Leg Straight - 3 reps - 1 sets - 30 seconds hold - 2x daily - 7x weekly  Prone Quadriceps Stretch with Strap - 3 reps - 1 sets - 30 seconds hold - 2x daily - 7x weekly  Seated Hip Flexor Stretch - 3 reps - 1 sets - 30 seconds hold - 2x daily - 7x weekly  Supine Lower Trunk Rotation - 3 reps - 1 sets - 30 sec hold - 2x daily - 7x weekly

## 2019-04-03 ENCOUNTER — Ambulatory Visit (INDEPENDENT_AMBULATORY_CARE_PROVIDER_SITE_OTHER): Payer: Medicare HMO

## 2019-04-03 DIAGNOSIS — N281 Cyst of kidney, acquired: Secondary | ICD-10-CM | POA: Diagnosis not present

## 2019-04-07 ENCOUNTER — Encounter: Payer: Self-pay | Admitting: Rehabilitative and Restorative Service Providers"

## 2019-04-07 ENCOUNTER — Ambulatory Visit: Payer: Medicare HMO | Admitting: Rehabilitative and Restorative Service Providers"

## 2019-04-07 ENCOUNTER — Other Ambulatory Visit: Payer: Self-pay

## 2019-04-07 DIAGNOSIS — G8929 Other chronic pain: Secondary | ICD-10-CM | POA: Diagnosis not present

## 2019-04-07 DIAGNOSIS — M542 Cervicalgia: Secondary | ICD-10-CM

## 2019-04-07 DIAGNOSIS — M545 Low back pain, unspecified: Secondary | ICD-10-CM

## 2019-04-07 DIAGNOSIS — R293 Abnormal posture: Secondary | ICD-10-CM | POA: Diagnosis not present

## 2019-04-07 DIAGNOSIS — R29898 Other symptoms and signs involving the musculoskeletal system: Secondary | ICD-10-CM

## 2019-04-07 NOTE — Patient Instructions (Signed)
Abdominal Bracing With Pelvic Floor (Hook-Lying)    With neutral spine, tighten pelvic floor; suck belly button to back bone; tighten muscles in the low back at waist. Exhale slowly to the count of 10  Repeat __10_ times. Do _several__ times a day. Progress to do this in sitting; standing; walking and with functional activities.

## 2019-04-07 NOTE — Therapy (Signed)
[facility] [place], [place], [address] Phone: [phone]   Fax:  [phone]  Physical Therapy Treatment  Patient Details  Name: Kevin Wood MRN: [id number] Date of Birth: [birth year] Referring Provider (PT): Dr Lynne Leader    Encounter Date: 04/07/2019  PT End of Session - 04/07/19 1204    Visit Number  6    Number of Visits  20    Date for PT Re-Evaluation  05/13/19    PT Start Time  1200    PT Stop Time  1259    PT Time Calculation (min)  59 min    Activity Tolerance  Patient tolerated treatment well       Past Medical History:  Diagnosis Date  . Elevated blood pressure 12/12/2012  . H/O degenerative disc disease 12/17/2012  . History of nephrolithiasis 12/17/2012  . Hyperlipidemia 12/12/2012  . Lumbar vertebral fracture ([place]) [place]  . Type 2 diabetes mellitus ([place]) 12/17/2012  . Unspecified vitamin D deficiency 12/17/2012   Outside Records     Past Surgical History:  Procedure Laterality Date  . c spine fustion  1994  . colon cancer  father  . GALLBLADDER SURGERY  2012    There were no vitals filed for this visit.  Subjective Assessment - 04/07/19 1204    Subjective  Neck is doing "pretty good" - still stiff. Back is not bad today - was really bad yesterday. He got up this morning and did his exercises then went for his mile walk.     Currently in Pain?  No/denies    Pain Score  0-No pain    Pain Score  3    Pain Location  Back    Pain Orientation  Left;Right    Pain Descriptors / Indicators  Aching;Stabbing    Pain Type  Chronic pain;Acute pain                       [facility] Adult PT Treatment/Exercise - 04/07/19 0001      Lumbar Exercises: Stretches   Passive Hamstring Stretch  Right;Left;30 seconds;2 reps   supine with strap    Lower Trunk Rotation  --   hold in clinc and at home due to pain    Quad Stretch  Right;Left;30 seconds;2 reps   prone with  strap    ITB Stretch  Right;Left;30 seconds;2 reps   supine with strap    Piriformis Stretch  Right;Left;3 reps;30 seconds   supine travell      Lumbar Exercises: Aerobic   Nustep  L5 x 5 min       Moist Heat Therapy   Number Minutes Moist Heat  15 Minutes    Moist Heat Location  Lumbar Spine;Cervical   thoracic spine      Electrical Stimulation   Electrical Stimulation Location  bilat lumbar     Electrical Stimulation Action  IFC    Electrical Stimulation Parameters  to tolerance    Electrical Stimulation Goals  Tone;Pain      Manual Therapy   Manual therapy comments  pt in prone     Joint Mobilization  CPA mobs lumbar spine; PA mobs through SI and hip Lt     Soft tissue mobilization  bilat lumbar paraspinals to Rt posterior hip     Myofascial Release  posterior lumbar to Rt posterior hip     Passive ROM  IR/ER in hip extension; knee flexion pt prone  w/ counter pressure through posterior hip several repas - tightner Rt than Lt     Kinesiotex  Inhibit Muscle;Facilitate Muscle   X pattern with regular tape over Rt SI joint             PT Education - 04/07/19 1217    Education Details  HEP     Person(s) Educated  Patient    Methods  Explanation;Demonstration;Tactile cues;Verbal cues;Handout    Comprehension  Verbalized understanding;Returned demonstration;Verbal cues required;Tactile cues required          PT Long Term Goals - 04/02/19 0839      PT LONG TERM GOAL #1   Title  Improve posture and alignment with patient to demonstrate more upright posture/position of head over chest/shoudlers allowing improved cervical positioning and improved movement quality thus decreasing pain and improving function 05/14/2019    Time  8    Period  Weeks    Status  Revised      PT LONG TERM GOAL #2   Title  Increase cervical ROM in lateral flexion and rotation by 5-8 degrees 05/14/2019    Time  8    Period  Weeks    Status  Revised      PT LONG TERM GOAL #3   Title  Patient  to report decreased stiffness with movement and improved tolerance for sitting to 10-15 min with minimal to no increase in pain 05/14/2019    Time  8    Period  Weeks    Status  Revised      PT LONG TERM GOAL #4   Title  Independent in HEP with good program to improve mobility 05/14/2019    Time  8    Period  Weeks    Status  Revised      PT LONG TERM GOAL #5   Title  Improve FOTO to </= 43% limitation 05/14/2019    Time  8    Period  Weeks    Status  Revised      PT LONG TERM GOAL #6   Title  Improve trunk and LE mobility to allow patient to perform functional activities with less pain and greater ease - (ex - putting shoes on) 05/14/2019    Time  6    Period  Weeks    Status  New      PT LONG TERM GOAL #7   Title  Patient to report ability to get up for lying down or sitting with 50-75% less pain 05/14/2019    Time  6    Period  Weeks    Status  New            Plan - 04/07/19 1204    Clinical Impression Statement  Some stiffness in cervical spine but no pain. Pain persists in the Rt > Lt lumbar spine to Rt SI region. Patient tolerates manual work well. Trial of X kinesotaping Rt SI area. Added 4 part core with some discomfort with contraction of multifi - suggested patient continue with core exercise and modify the intensity of multifi contraction.     Rehab Potential  Good    PT Frequency  2x / week    PT Duration  6 weeks    PT Treatment/Interventions  ADLs/Self Care Home Management;Patient/family education;Cryotherapy;Electrical Stimulation;Iontophoresis 4mg /ml Dexamethasone;Moist Heat;Traction;Ultrasound;Neuromuscular re-education;Dry needling;Manual techniques;Therapeutic activities;Therapeutic exercise;Passive range of motion    PT Next Visit Plan  review HEP; address LBP with exercise, manual work and modalities as indicated; trial of  kineso taping for Rt SI area; work on thoracic extension; spinal mobs; manual work thoracic and cervical spine; posterior shoulder girdle  strengthening; modaliites as indicated - not a fan of DN for lumbar spine     PT Home Exercise Plan  Access Code: [email or web address]     Consulted and Agree with Plan of Care  Patient       Patient will benefit from skilled therapeutic intervention in order to improve the following deficits and impairments:  Postural dysfunction, Improper body mechanics, Pain, Increased fascial restricitons, Increased muscle spasms, Hypomobility, Decreased range of motion, Decreased mobility, Decreased activity tolerance  Visit Diagnosis: Cervicalgia  Other symptoms and signs involving the musculoskeletal system  Abnormal posture  Chronic right-sided low back pain without sciatica     Problem List Patient Active Problem List   Diagnosis Date Noted  . DNR (do not resuscitate) 07/24/2018  . 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 Lateral epicondylitis of right elbow 01/21/2018  . Thyroid nodule 11/16/2017  . Carotid arterial disease ([place]) 11/14/2017  . Seborrheic keratoses 11/07/2016  . Acute medial meniscus tear of left knee 05/15/2016  . Internal hemorrhoids 04/12/2015  . 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 Essential hypertension 08/11/2013  . Controlled type 2 diabetes mellitus without complication, without long-term current use of insulin ([place]) 12/17/2012  . History of nephrolithiasis 12/17/2012  . Vitamin D deficiency 12/17/2012  . Excessive cerumen in ear canal 12/17/2012  . H/O degenerative disc disease 12/17/2012  . 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 Family history of colon cancer 12/17/2012  . Hyperlipidemia 12/12/2012    Jenavee Laguardia Nilda Simmer PT, MPH  04/07/2019, 12:55 PM  [facility] [place], [place], [address] Phone: [phone]   Fax:  [phone]  Name: Kevin Wood MRN: [id number] Date of Birth: 28-Jan-1946

## 2019-04-09 ENCOUNTER — Other Ambulatory Visit: Payer: Self-pay

## 2019-04-09 ENCOUNTER — Encounter: Payer: Self-pay | Admitting: Rehabilitative and Restorative Service Providers"

## 2019-04-09 ENCOUNTER — Ambulatory Visit: Payer: Medicare HMO | Admitting: Rehabilitative and Restorative Service Providers"

## 2019-04-09 DIAGNOSIS — M545 Low back pain, unspecified: Secondary | ICD-10-CM

## 2019-04-09 DIAGNOSIS — M542 Cervicalgia: Secondary | ICD-10-CM | POA: Diagnosis not present

## 2019-04-09 DIAGNOSIS — R29898 Other symptoms and signs involving the musculoskeletal system: Secondary | ICD-10-CM | POA: Diagnosis not present

## 2019-04-09 DIAGNOSIS — R293 Abnormal posture: Secondary | ICD-10-CM | POA: Diagnosis not present

## 2019-04-09 DIAGNOSIS — G8929 Other chronic pain: Secondary | ICD-10-CM | POA: Diagnosis not present

## 2019-04-09 NOTE — Therapy (Signed)
[facility] [place]  [place], [place], [address] Phone: [phone]   Fax:  [phone]  Physical Therapy Treatment  Patient Details  Name: Kevin Wood MRN: [id number] Date of Birth: 23-Nov-1945 Referring Provider (PT): Dr Lynne Leader    Encounter Date: 04/09/2019  PT End of Session - 04/09/19 1158    Visit Number  7    Number of Visits  20    Date for PT Re-Evaluation  05/13/19    PT Start Time  3382    PT Stop Time  1256    PT Time Calculation (min)  58 min    Activity Tolerance  Patient tolerated treatment well       Past Medical History:  Diagnosis Date  . Elevated blood pressure 12/12/2012  . H/O degenerative disc disease 12/17/2012  . History of nephrolithiasis 12/17/2012  . Hyperlipidemia 12/12/2012  . Lumbar vertebral fracture ([place]) [place]  . Type 2 diabetes mellitus ([place]) 12/17/2012  . Unspecified vitamin D deficiency 12/17/2012   Outside Records     Past Surgical History:  Procedure Laterality Date  . c spine fustion  1994  . colon cancer  father  . GALLBLADDER SURGERY  2012    There were no vitals filed for this visit.  Subjective Assessment - 04/09/19 1201    Subjective  Neck is good - back still hurts. Some days are worse than others. Yesterday was a bad day - today not so much. Doing his exercises at home.     Currently in Pain?  No/denies    Pain Location  Neck    Pain Score  4    Pain Location  Back    Pain Orientation  Left;Right    Pain Descriptors / Indicators  Aching;Stabbing   stabbing when getting out of the floor    Pain Type  Chronic pain;Acute pain                       [facility] Adult PT Treatment/Exercise - 04/09/19 0001      Lumbar Exercises: Stretches   Passive Hamstring Stretch  Right;Left;30 seconds;2 reps   supine with strap    Single Knee to Chest Stretch Limitations  diagonal knee to chest 30 sec x 2 each LE     ITB Stretch  Right;Left;30  seconds;2 reps   supine with strap    Piriformis Stretch  Right;Left;3 reps;30 seconds   supine travell      Lumbar Exercises: Aerobic   Nustep  L5 x 5 min UE (11)       Lumbar Exercises: Supine   Bridge  10 reps;5 seconds   encouraged pt to tighten core      Moist Heat Therapy   Number Minutes Moist Heat  15 Minutes    Moist Heat Location  Lumbar Spine;Cervical   thoracic spine      Electrical Stimulation   Electrical Stimulation Location  bilat lumbar to Rt SI    Electrical Stimulation Action  IFC    Electrical Stimulation Parameters  to tolerance    Electrical Stimulation Goals  Tone;Pain      Manual Therapy   Manual therapy comments  pt in supine and prone     Joint Mobilization  CPA mobs lumbar spine; PA mobs through Rt SI and hip; AP glides Rt hip with PT supporting Rt LE in 90 deg hip and knee flexion applying force through the femur posteriorly toward sacrum  Passive ROM  IR/ER in hip flexion with hip and knee 90/90 deg; extension; knee flexion pt prone w/ counter pressure through posterior hip several repas - tightner Rt than Lt     Kinesiotex  Inhibit Muscle;Facilitate Muscle   star pattern with regular tape over Rt SI joint                  PT Long Term Goals - 04/02/19 0839      PT LONG TERM GOAL #1   Title  Improve posture and alignment with patient to demonstrate more upright posture/position of head over chest/shoudlers allowing improved cervical positioning and improved movement quality thus decreasing pain and improving function 05/14/2019    Time  8    Period  Weeks    Status  Revised      PT LONG TERM GOAL #2   Title  Increase cervical ROM in lateral flexion and rotation by 5-8 degrees 05/14/2019    Time  8    Period  Weeks    Status  Revised      PT LONG TERM GOAL #3   Title  Patient to report decreased stiffness with movement and improved tolerance for sitting to 10-15 min with minimal to no increase in pain 05/14/2019    Time  8    Period   Weeks    Status  Revised      PT LONG TERM GOAL #4   Title  Independent in HEP with good program to improve mobility 05/14/2019    Time  8    Period  Weeks    Status  Revised      PT LONG TERM GOAL #5   Title  Improve FOTO to </= 43% limitation 05/14/2019    Time  8    Period  Weeks    Status  Revised      PT LONG TERM GOAL #6   Title  Improve trunk and LE mobility to allow patient to perform functional activities with less pain and greater ease - (ex - putting shoes on) 05/14/2019    Time  6    Period  Weeks    Status  New      PT LONG TERM GOAL #7   Title  Patient to report ability to get up for lying down or sitting with 50-75% less pain 05/14/2019    Time  6    Period  Weeks    Status  New            Plan - 04/09/19 1201    Clinical Impression Statement  Intermittent pain in Rt > Lt LB. Waunita Schooner has limited trunk and hip mobilty and ROM. Working today on spinal mobs and mobs for Rt hip into Rt SI area with pt in supine and prone. Tolerated well. Added exercise without difficulty. Pt pain free post treatment.     Rehab Potential  Good    PT Frequency  2x / week    PT Duration  6 weeks    PT Treatment/Interventions  ADLs/Self Care Home Management;Patient/family education;Cryotherapy;Electrical Stimulation;Iontophoresis 4mg /ml Dexamethasone;Moist Heat;Traction;Ultrasound;Neuromuscular re-education;Dry needling;Manual techniques;Therapeutic activities;Therapeutic exercise;Passive range of motion    PT Next Visit Plan  review HEP; address LBP with exercise, manual work and modalities as indicated; trial of kineso taping for Rt SI area; work on thoracic extension; spinal mobs; manual work thoracic and cervical spine; posterior shoulder girdle strengthening; modaliites as indicated - not a fan of DN for lumbar spine  PT Home Exercise Plan  Access Code: [email or web address]     Consulted and Agree with Plan of Care  Patient       Patient will benefit from skilled therapeutic intervention in  order to improve the following deficits and impairments:  Postural dysfunction, Improper body mechanics, Pain, Increased fascial restricitons, Increased muscle spasms, Hypomobility, Decreased range of motion, Decreased mobility, Decreased activity tolerance  Visit Diagnosis: Cervicalgia  Other symptoms and signs involving the musculoskeletal system  Abnormal posture  Chronic right-sided low back pain without sciatica     Problem List Patient Active Problem List   Diagnosis Date Noted  . DNR (do not resuscitate) 07/24/2018  . 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 Lateral epicondylitis of right elbow 01/21/2018  . Thyroid nodule 11/16/2017  . Carotid arterial disease ([place]) 11/14/2017  . Seborrheic keratoses 11/07/2016  . Acute medial meniscus tear of left knee 05/15/2016  . Internal hemorrhoids 04/12/2015  . 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 Essential hypertension 08/11/2013  . Controlled type 2 diabetes mellitus without complication, without long-term current use of insulin ([place]) 12/17/2012  . History of nephrolithiasis 12/17/2012  . Vitamin D deficiency 12/17/2012  . Excessive cerumen in ear canal 12/17/2012  . H/O degenerative disc disease 12/17/2012  . 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 Family history of colon cancer 12/17/2012  . Hyperlipidemia 12/12/2012    Patria Warzecha Nilda Simmer PT, MPH  04/09/2019, 1:14 PM  [facility] [place], [place], [address] Phone: [phone]   Fax:  [phone]  Name: Dierks Wach MRN: [id number] Date of Birth: [birth year]

## 2019-04-14 ENCOUNTER — Encounter: Payer: Self-pay | Admitting: Rehabilitative and Restorative Service Providers"

## 2019-04-14 ENCOUNTER — Ambulatory Visit: Payer: Medicare HMO | Admitting: Rehabilitative and Restorative Service Providers"

## 2019-04-14 ENCOUNTER — Other Ambulatory Visit: Payer: Self-pay

## 2019-04-14 DIAGNOSIS — M545 Low back pain: Secondary | ICD-10-CM

## 2019-04-14 DIAGNOSIS — R29898 Other symptoms and signs involving the musculoskeletal system: Secondary | ICD-10-CM | POA: Diagnosis not present

## 2019-04-14 DIAGNOSIS — G8929 Other chronic pain: Secondary | ICD-10-CM | POA: Diagnosis not present

## 2019-04-14 DIAGNOSIS — M542 Cervicalgia: Secondary | ICD-10-CM | POA: Diagnosis not present

## 2019-04-14 DIAGNOSIS — R293 Abnormal posture: Secondary | ICD-10-CM

## 2019-04-14 NOTE — Patient Instructions (Addendum)
   Side Bend, Sitting can have pillows under left side to lie over as you stretch to the left to stretch the right side Sit with your feet flat on the floor     Sit with feet flat on floor. Bend to the left side, touching elbow to chair Hold __60_ seconds. To increase stretch, raise other arm above head.  Repeat _3__ times per session. Do _2-3__ sessions per day.  Access Code: [email or web address]  URL: [email or web address]  Date: 04/14/2019  Prepared by: Gillermo Murdoch   Exercises  Seated Cervical Retraction - 10 reps - 1 sets - 3x daily - 7x weekly  Standing Scapular Retraction - 10 reps - 1 sets - 10 hold - 3x daily - 7x weekly  Shoulder External Rotation and Scapular Retraction - 10 reps - 1 sets - hold - 3x daily - 7x weekly  Standing Scapular Retraction in Abduction - 10 reps - 1 sets - 3x daily - 7x weekly  Doorway Pec Stretch at 60 Degrees Abduction - 3 reps - 1 sets - 3x daily - 7x weekly  Doorway Pec Stretch at 90 Degrees Abduction - 3 reps - 1 sets - 30 seconds hold - 3x daily - 7x weekly  Doorway Pec Stretch at 120 Degrees Abduction - 3 reps - 1 sets - 30 second hold hold - 3x daily - 7x weekly  Supine Hamstring Stretch with Strap - 10 reps - 1 sets - 30 seconds hold - 2x daily - 7x weekly  Supine ITB Stretch with Strap - 3 reps - 1 sets - 30 seconds hold - 2x daily - 7x weekly  Supine Piriformis Stretch with Leg Straight - 3 reps - 1 sets - 30 seconds hold - 2x daily - 7x weekly  Prone Quadriceps Stretch with Strap - 3 reps - 1 sets - 30 seconds hold - 2x daily - 7x weekly  Seated Hip Flexor Stretch - 3 reps - 1 sets - 30 seconds hold - 2x daily - 7x weekly  Supine Lower Trunk Rotation - 3 reps - 1 sets - 30 sec hold - 2x daily - 7x weekly   Today  Hooklying Isometric Clamshell - 10 reps - 1-2 sets - 2-3 sec hold - 2x daily - 7x weekly  Seated Anterior Pelvic Tilt - 10-15 reps - 1 sets - 2-3 sec hold - 2x daily - 7x weekly

## 2019-04-14 NOTE — Therapy (Signed)
[facility] [place], [place], [address] Phone: [phone]   Fax:  [phone]  Physical Therapy Treatment  Patient Details  Name: Kevin Wood MRN: [id number] Date of Birth: 16-Jun-1946 Referring Provider (PT): Dr Lynne Leader    Encounter Date: 04/14/2019  PT End of Session - 04/14/19 1015    Visit Number  8    Number of Visits  20    Date for PT Re-Evaluation  05/13/19    PT Start Time  1000    PT Stop Time  1104    PT Time Calculation (min)  64 min    Activity Tolerance  Patient tolerated treatment well       Past Medical History:  Diagnosis Date  . Elevated blood pressure 12/12/2012  . H/O degenerative disc disease 12/17/2012  . History of nephrolithiasis 12/17/2012  . Hyperlipidemia 12/12/2012  . Lumbar vertebral fracture ([place]) [place]  . Type 2 diabetes mellitus ([place]) 12/17/2012  . Unspecified vitamin D deficiency 12/17/2012   Outside Records     Past Surgical History:  Procedure Laterality Date  . c spine fustion  1994  . colon cancer  father  . GALLBLADDER SURGERY  2012    There were no vitals filed for this visit.  Subjective Assessment - 04/14/19 1016    Subjective  Pain was severe over the weekeknd. Really bad in the mornings for several hours. He was in the pool over the weekend. Lies on the floor and symptoms subside. This morning was "pretty good" Always has pain in the Rt LB with sitting and with rolling to the Rt     Currently in Pain?  Yes    Pain Score  3     Pain Location  Back    Pain Orientation  Right    Pain Descriptors / Indicators  Tightness    Pain Type  Chronic pain    Pain Onset  More than a month ago    Pain Frequency  Intermittent    Aggravating Factors   unknown     Pain Relieving Factors  lying on bck; TENS; heat     Pain Score  0    Pain Location  Neck    Pain Orientation  Left    Pain Descriptors / Indicators  Aching    Pain Type  Acute pain    Pain Radiating Towards  none    Pain Onset  More than a month ago    Pain Frequency  Intermittent    Aggravating Factors   looking up     Pain Relieving Factors  heat exercise          [facility] PT Assessment - 04/14/19 0001      Assessment   Medical Diagnosis  Cervical dysfunction     Referring Provider (PT)  Dr Lynne Leader     Onset Date/Surgical Date  02/18/19    Hand Dominance  Left    Next MD Visit  PRN     Prior Therapy  yes here 1/19-2/19; 2/20-3/20       AROM   Lumbar Flexion  50% mild pain in Rt low back    Lumbar Extension  25%    Lumbar - Right Side Bend  20% pain Rt LB     Lumbar - Left Side Bend  25% no pain     Lumbar - Right Rotation  35% tight    Lumbar - Left Rotation  30%  tight       Palpation   Spinal mobility  hypomobility lumbar spine as well as upper thoracic and lower cervical with CPA mobs - fusion mid cervical spine     Palpation comment  muscular tightness thorugh the psoas; QL; lumbar paraspinals; lats bilt as well as ant/lat/post cervical musculature; pecs; upper trap; leveator; teres; cervical and thoracic paraspinals                    [facility] Adult PT Treatment/Exercise - 04/14/19 0001      Self-Care   Self-Care  --   suggested pt try folded towel Lt side w/ Lt sidelying sleep      Lumbar Exercises: Stretches   Other Lumbar Stretch Exercise  supine lat stretch with PT assist 20-45 sec hold x 3 reps     Other Lumbar Stretch Exercise  seated lateral trunk flexion stretch over bolster and pillow arm overhead 30-45 sec x 3       Lumbar Exercises: Aerobic   Nustep  L5 x 10 min UE (11)       Lumbar Exercises: Seated   Other Seated Lumbar Exercises  anterior/posterior pelvic tilt cues to keep shoulders still - seated on table x 15-20 reps limited lumbar movement noted; repeated on dynacisc with some increase in movement in lumbar spine - some discomfort per pt report  - in the area he typically has pain in       Lumbar Exercises: Supine    Clam  15 reps;2 seconds   alternating LE's blue TB    Bridge  10 reps;5 seconds   encouraged pt to tighten core    Other Supine Lumbar Exercises  4 part core 10 sec x 10 - some pain in the LB       Moist Heat Therapy   Number Minutes Moist Heat  20 Minutes    Moist Heat Location  Lumbar Spine;Cervical   thoracic spine      Electrical Stimulation   Electrical Stimulation Location  Rt lumbar/SI     Electrical Stimulation Action  IFC    Electrical Stimulation Parameters  to tolerance    Electrical Stimulation Goals  Tone;Pain      Manual Therapy   Passive ROM  assisted lat stretch in supine with PT providing overpressure into hip and knee flexion - produced stretch into the area of familiar pain Rt LB/lumbar area                   PT Long Term Goals - 04/02/19 0839      PT LONG TERM GOAL #1   Title  Improve posture and alignment with patient to demonstrate more upright posture/position of head over chest/shoudlers allowing improved cervical positioning and improved movement quality thus decreasing pain and improving function 05/14/2019    Time  8    Period  Weeks    Status  Revised      PT LONG TERM GOAL #2   Title  Increase cervical ROM in lateral flexion and rotation by 5-8 degrees 05/14/2019    Time  8    Period  Weeks    Status  Revised      PT LONG TERM GOAL #3   Title  Patient to report decreased stiffness with movement and improved tolerance for sitting to 10-15 min with minimal to no increase in pain 05/14/2019    Time  8    Period  Weeks    Status  Revised      PT LONG TERM GOAL #4   Title  Independent in HEP with good program to improve mobility 05/14/2019    Time  8    Period  Weeks    Status  Revised      PT LONG TERM GOAL #5   Title  Improve FOTO to </= 43% limitation 05/14/2019    Time  8    Period  Weeks    Status  Revised      PT LONG TERM GOAL #6   Title  Improve trunk and LE mobility to allow patient to perform functional activities with less  pain and greater ease - (ex - putting shoes on) 05/14/2019    Time  6    Period  Weeks    Status  New      PT LONG TERM GOAL #7   Title  Patient to report ability to get up for lying down or sitting with 50-75% less pain 05/14/2019    Time  6    Period  Weeks    Status  New            Plan - 04/14/19 1016    Clinical Impression Statement  Persistent Rt LBP intense at times. Trunk mobility remains limited. Patient does not have difficulty with exercises and added core stabilization exercises with some discomfort and familiar pain requiring PT assist with verbal and tactile cues. Patient felt stretch/pain in the area where he normally feels pain in the morning. Will continue to address lumbar tightness Rt QL/lats     Rehab Potential  Good    PT Frequency  2x / week    PT Duration  6 weeks    PT Treatment/Interventions  ADLs/Self Care Home Management;Patient/family education;Cryotherapy;Electrical Stimulation;Iontophoresis 4mg /ml Dexamethasone;Moist Heat;Traction;Ultrasound;Neuromuscular re-education;Dry needling;Manual techniques;Therapeutic activities;Therapeutic exercise;Passive range of motion    PT Next Visit Plan  review HEP; continue treatment of LBP with exercise, manual work and modalities as indicated; work on thoracic extension; spinal mobs; manual work thoracic and cervical spine; posterior shoulder girdle strengthening; modaliites as indicated - not a fan of DN for lumbar spine     PT Home Exercise Plan  Access Code: [email or web address]     Consulted and Agree with Plan of Care  Patient       Patient will benefit from skilled therapeutic intervention in order to improve the following deficits and impairments:  Postural dysfunction, Improper body mechanics, Pain, Increased fascial restricitons, Increased muscle spasms, Hypomobility, Decreased range of motion, Decreased mobility, Decreased activity tolerance  Visit Diagnosis: Cervicalgia  Other symptoms and signs involving the  musculoskeletal system  Abnormal posture  Chronic right-sided low back pain without sciatica     Problem List Patient Active Problem List   Diagnosis Date Noted  . DNR (do not resuscitate) 07/24/2018  . 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 Lateral epicondylitis of right elbow 01/21/2018  . Thyroid nodule 11/16/2017  . Carotid arterial disease ([place]) 11/14/2017  . Seborrheic keratoses 11/07/2016  . Acute medial meniscus tear of left knee 05/15/2016  . Internal hemorrhoids 04/12/2015  . 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 Essential hypertension 08/11/2013  . Controlled type 2 diabetes mellitus without complication, without long-term current use of insulin ([place]) 12/17/2012  . History of nephrolithiasis 12/17/2012  . Vitamin D deficiency 12/17/2012  . Excessive cerumen in ear canal 12/17/2012  . H/O degenerative disc disease 12/17/2012  . 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 Family history of  colon cancer 12/17/2012  . Hyperlipidemia 12/12/2012    Celyn Nilda Simmer PT, MPH  04/14/2019, 11:14 AM  [facility] [place], [place], [address] Phone: [phone]   Fax:  [phone]  Name: Kevin Wood MRN: [id number] Date of Birth: 04-11-1946

## 2019-04-16 ENCOUNTER — Ambulatory Visit: Payer: Medicare HMO | Admitting: Rehabilitative and Restorative Service Providers"

## 2019-04-16 ENCOUNTER — Other Ambulatory Visit: Payer: Self-pay

## 2019-04-16 ENCOUNTER — Encounter: Payer: Self-pay | Admitting: Rehabilitative and Restorative Service Providers"

## 2019-04-16 DIAGNOSIS — M545 Low back pain, unspecified: Secondary | ICD-10-CM

## 2019-04-16 DIAGNOSIS — M542 Cervicalgia: Secondary | ICD-10-CM | POA: Diagnosis not present

## 2019-04-16 DIAGNOSIS — R293 Abnormal posture: Secondary | ICD-10-CM | POA: Diagnosis not present

## 2019-04-16 DIAGNOSIS — R29898 Other symptoms and signs involving the musculoskeletal system: Secondary | ICD-10-CM | POA: Diagnosis not present

## 2019-04-16 DIAGNOSIS — G8929 Other chronic pain: Secondary | ICD-10-CM | POA: Diagnosis not present

## 2019-04-16 NOTE — Patient Instructions (Signed)
Access Code: [email or web address]  URL: [email or web address]  Date: 04/16/2019  Prepared by: Gillermo Murdoch   Exercises  Seated Cervical Retraction - 10 reps - 1 sets - 3x daily - 7x weekly  Standing Scapular Retraction - 10 reps - 1 sets - 10 hold - 3x daily - 7x weekly  Shoulder External Rotation and Scapular Retraction - 10 reps - 1 sets - hold - 3x daily - 7x weekly  Standing Scapular Retraction in Abduction - 10 reps - 1 sets - 3x daily - 7x weekly  Doorway Pec Stretch at 60 Degrees Abduction - 3 reps - 1 sets - 3x daily - 7x weekly  Doorway Pec Stretch at 90 Degrees Abduction - 3 reps - 1 sets - 30 seconds hold - 3x daily - 7x weekly  Doorway Pec Stretch at 120 Degrees Abduction - 3 reps - 1 sets - 30 second hold hold - 3x daily - 7x weekly  Supine Hamstring Stretch with Strap - 10 reps - 1 sets - 30 seconds hold - 2x daily - 7x weekly  Supine ITB Stretch with Strap - 3 reps - 1 sets - 30 seconds hold - 2x daily - 7x weekly  Supine Piriformis Stretch with Leg Straight - 3 reps - 1 sets - 30 seconds hold - 2x daily - 7x weekly  Prone Quadriceps Stretch with Strap - 3 reps - 1 sets - 30 seconds hold - 2x daily - 7x weekly  Seated Hip Flexor Stretch - 3 reps - 1 sets - 30 seconds hold - 2x daily - 7x weekly  Supine Lower Trunk Rotation - 3 reps - 1 sets - 30 sec hold - 2x daily - 7x weekly  Hooklying Isometric Clamshell - 10 reps - 1-2 sets - 2-3 sec hold - 2x daily - 7x weekly  Seated Anterior Pelvic Tilt - 10-15 reps - 1 sets - 2-3 sec hold - 2x daily - 7x weekly  Cat Cow - 5-10 reps - 1 sets - 2-3 sec hold - 2x daily - 7x weekly  Childs Pose - 3 reps - 1 sets - 20-30 sec hold - 2x daily - 7x weekly

## 2019-04-16 NOTE — Therapy (Signed)
[place], [place], [address] Phone: [phone]   Fax:  [phone]  Physical Therapy Treatment  Patient Details  Name: Kevin Wood MRN: [id number] Date of Birth: [birth year] Referring Provider (PT): Dr Lynne Leader    Encounter Date: 04/16/2019  PT End of Session - 04/16/19 1000    Visit Number  9    Number of Visits  20    Date for PT Re-Evaluation  05/13/19    PT Start Time  0957    PT Stop Time  1055    PT Time Calculation (min)  58 min    Activity Tolerance  Patient tolerated treatment well       Past Medical History:  Diagnosis Date  . Elevated blood pressure 12/12/2012  . H/O degenerative disc disease 12/17/2012  . History of nephrolithiasis 12/17/2012  . Hyperlipidemia 12/12/2012  . Lumbar vertebral fracture ([place]) [place]  . Type 2 diabetes mellitus ([place]) 12/17/2012  . Unspecified vitamin D deficiency 12/17/2012   Outside Records     Past Surgical History:  Procedure Laterality Date  . c spine fustion  1994  . colon cancer  father  . GALLBLADDER SURGERY  2012    There were no vitals filed for this visit.  Subjective Assessment - 04/16/19 1002    Subjective  Much better - has awoken the past two mornings without pain - does have some soreness from the last exercises but no pain. Can put his shoes on without difficulty.    Currently in Pain?  No/denies                       [facility] Adult PT Treatment/Exercise - 04/16/19 0001      Lumbar Exercises: Stretches   Other Lumbar Stretch Exercise  seated lateral trunk flexion stretch over bolster and pillow arm overhead 30-45 sec x 3       Lumbar Exercises: Seated   Other Seated Lumbar Exercises  anterior/posterior pelvic tilt cues to keep shoulders still - seated on table x 15-20 reps limited lumbar movement noted; repeated on dynacisc with some increase in movement in lumbar spine - some soreness no discomfort        Lumbar Exercises: Supine   Other Supine Lumbar Exercises  4 part core 10 sec x 10 - some pain in the LB       Lumbar Exercises: Quadruped   [place]/Old Horse  10 reps   knees resting on pillow on table - VC/TC required   Other Quadruped Lumbar Exercises  child's pose 5 reps x 20-30 sec hold       Shoulder Exercises: ROM/Strengthening   UBE (Upper Arm Bike)  L3 x 4 min (fwd/back)       Shoulder Exercises: Stretch   Other Shoulder Stretches  3 position doorway stretch 15-20 sec x 2 reps each position       Moist Heat Therapy   Number Minutes Moist Heat  20 Minutes    Moist Heat Location  Lumbar Spine;Cervical   thoracic spine      Electrical Stimulation   Electrical Stimulation Location  bilat lumbar/SI     Electrical Stimulation Action  IFC    Electrical Stimulation Parameters  to tolerance    Electrical Stimulation Goals  Tone;Pain             PT Education - 04/16/19 1026    Education Details  HEP  Person(s) Educated  Patient    Methods  Explanation;Demonstration;Tactile cues;Verbal cues;Handout    Comprehension  Verbalized understanding;Returned demonstration;Verbal cues required;Tactile cues required          PT Long Term Goals - 04/02/19 0839      PT LONG TERM GOAL #1   Title  Improve posture and alignment with patient to demonstrate more upright posture/position of head over chest/shoudlers allowing improved cervical positioning and improved movement quality thus decreasing pain and improving function 05/14/2019    Time  8    Period  Weeks    Status  Revised      PT LONG TERM GOAL #2   Title  Increase cervical ROM in lateral flexion and rotation by 5-8 degrees 05/14/2019    Time  8    Period  Weeks    Status  Revised      PT LONG TERM GOAL #3   Title  Patient to report decreased stiffness with movement and improved tolerance for sitting to 10-15 min with minimal to no increase in pain 05/14/2019    Time  8    Period  Weeks    Status  Revised       PT LONG TERM GOAL #4   Title  Independent in HEP with good program to improve mobility 05/14/2019    Time  8    Period  Weeks    Status  Revised      PT LONG TERM GOAL #5   Title  Improve FOTO to </= 43% limitation 05/14/2019    Time  8    Period  Weeks    Status  Revised      PT LONG TERM GOAL #6   Title  Improve trunk and LE mobility to allow patient to perform functional activities with less pain and greater ease - (ex - putting shoes on) 05/14/2019    Time  6    Period  Weeks    Status  New      PT LONG TERM GOAL #7   Title  Patient to report ability to get up for lying down or sitting with 50-75% less pain 05/14/2019    Time  6    Period  Weeks    Status  New            Plan - 04/16/19 1001    Clinical Impression Statement  Excellent response to last treatment with pt reporting that he has not awoken with pain in the past two mornings. He can put his socks on without trouble. He has some soreness but no real pain. Worked on segmental mobility through the lumbar spine. Noted some improvement with exercises. Progressing well.    Rehab Potential  Good    PT Frequency  2x / week    PT Duration  6 weeks    PT Treatment/Interventions  ADLs/Self Care Home Management;Patient/family education;Cryotherapy;Electrical Stimulation;Iontophoresis 4mg /ml Dexamethasone;Moist Heat;Traction;Ultrasound;Neuromuscular re-education;Dry needling;Manual techniques;Therapeutic activities;Therapeutic exercise;Passive range of motion    PT Next Visit Plan  review HEP; continue treatment of LBP with exercise, manual work and modalities as indicated; work on thoracic extension; spinal mobs; manual work thoracic and cervical spine; posterior shoulder girdle strengthening; modaliites as indicated - not a fan of DN for lumbar spine     PT Home Exercise Plan  Access Code: [email or web address]     Consulted and Agree with Plan of Care  Patient       Patient will benefit from skilled therapeutic intervention in order to  improve the following deficits and impairments:  Postural dysfunction, Improper body mechanics, Pain, Increased fascial restricitons, Increased muscle spasms, Hypomobility, Decreased range of motion, Decreased mobility, Decreased activity tolerance  Visit Diagnosis: Cervicalgia  Other symptoms and signs involving the musculoskeletal system  Abnormal posture  Chronic right-sided low back pain without sciatica     Problem List Patient Active Problem List   Diagnosis Date Noted  . DNR (do not resuscitate) 07/24/2018  . 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 Lateral epicondylitis of right elbow 01/21/2018  . Thyroid nodule 11/16/2017  . Carotid arterial disease ([place]) 11/14/2017  . Seborrheic keratoses 11/07/2016  . Acute medial meniscus tear of left knee 05/15/2016  . Internal hemorrhoids 04/12/2015  . 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 Essential hypertension 08/11/2013  . Controlled type 2 diabetes mellitus without complication, without long-term current use of insulin ([place]) 12/17/2012  . History of nephrolithiasis 12/17/2012  . Vitamin D deficiency 12/17/2012  . Excessive cerumen in ear canal 12/17/2012  . H/O degenerative disc disease 12/17/2012  . 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 Family history of colon cancer 12/17/2012  . Hyperlipidemia 12/12/2012    Thelma Lorenzetti Nilda Simmer PT, MPH 04/16/2019, 10:44 AM  [facility] [place], [place], [address] Phone: [phone]   Fax:  [phone]  Name: Cruise Baumgardner MRN: [id number] Date of Birth: 11-15-1945

## 2019-04-20 ENCOUNTER — Encounter: Payer: Self-pay | Admitting: Rehabilitative and Restorative Service Providers"

## 2019-04-20 ENCOUNTER — Other Ambulatory Visit: Payer: Self-pay

## 2019-04-20 ENCOUNTER — Ambulatory Visit: Payer: Medicare HMO | Admitting: Rehabilitative and Restorative Service Providers"

## 2019-04-20 DIAGNOSIS — R29898 Other symptoms and signs involving the musculoskeletal system: Secondary | ICD-10-CM

## 2019-04-20 DIAGNOSIS — M545 Low back pain: Secondary | ICD-10-CM | POA: Diagnosis not present

## 2019-04-20 DIAGNOSIS — M542 Cervicalgia: Secondary | ICD-10-CM | POA: Diagnosis not present

## 2019-04-20 DIAGNOSIS — R293 Abnormal posture: Secondary | ICD-10-CM

## 2019-04-20 DIAGNOSIS — G8929 Other chronic pain: Secondary | ICD-10-CM | POA: Diagnosis not present

## 2019-04-20 NOTE — Therapy (Signed)
[facility]  [place], [place], [address] Phone: [phone]   Fax:  [phone]  Physical Therapy Treatment  Progress Note Reporting Period 03/19/2019 to 04/20/2019  See note below for Objective Data and Assessment of Progress/Goals.   Excellent progress with cervical and lumbar dysfunction.       Patient Details  Name: Kevin Wood MRN: [id number] Date of Birth: [birth year] Referring Provider (PT): Dr Lynne Leader    Encounter Date: 04/20/2019  PT End of Session - 04/20/19 1515    Visit Number  10    Number of Visits  20    Date for PT Re-Evaluation  05/13/19    PT Start Time  1500    PT Stop Time  8676    PT Time Calculation (min)  54 min    Activity Tolerance  Patient tolerated treatment well       Past Medical History:  Diagnosis Date  . Elevated blood pressure 12/12/2012  . H/O degenerative disc disease 12/17/2012  . History of nephrolithiasis 12/17/2012  . Hyperlipidemia 12/12/2012  . Lumbar vertebral fracture ([place]) [place]  . Type 2 diabetes mellitus ([place]) 12/17/2012  . Unspecified vitamin D deficiency 12/17/2012   Outside Records     Past Surgical History:  Procedure Laterality Date  . c spine fustion  1994  . colon cancer  father  . GALLBLADDER SURGERY  2012    There were no vitals filed for this visit.  Subjective Assessment - 04/20/19 1516    Subjective  Patient reports that he continues to do well. He has a "real good weekend"; first weekend in a long time that he had no pain! He noticed some stiffness this am but is not having pain. He is doing all his exercises and feels he will soon be ready to continue on his own with exercise and stop therapy.    Currently in Pain?  No/denies    Pain Score  0   some stiffness in the neck no pain        [facility] PT Assessment - 04/20/19 0001      Assessment   Medical Diagnosis  Cervical dysfunction     Referring Provider (PT)  Dr  Lynne Leader     Onset Date/Surgical Date  02/18/19    Hand Dominance  Left    Next MD Visit  PRN     Prior Therapy  yes here 1/19-2/19; 2/20-3/20       Observation/Other Assessments   Focus on Therapeutic Outcomes (FOTO)   27% limitation       Posture/Postural Control   Posture Comments  posture improving but remains in head forward posture; shoulders rounded and elevated; increased thoracic kyphosis      AROM   Cervical Flexion  50    Cervical Extension  54    Cervical - Right Side Bend  20    Cervical - Left Side Bend  22   tight on Rt tipping to the Lt    Cervical - Right Rotation  48   tight    Cervical - Left Rotation  50   tight    Lumbar Flexion  705 pain free    Lumbar Extension  35%    Lumbar - Right Side Bend  20% tight no pain     Lumbar - Left Side Bend  30% pain in the Rt LB     Lumbar - Right Rotation  35% tightness  Lumbar - Left Rotation  35% limitation       Flexibility   Hamstrings  Rt 75 deg; lt 80 deg    Quadriceps  tight Lt > Rt     ITB  WFL's     Piriformis  tight Lt > Rt       Palpation   Spinal mobility  hypomobility lumbar spine as well as upper thoracic and lower cervical with CPA mobs - fusion mid cervical spine     Palpation comment  decreased muscular tightness thorugh the psoas; QL; lumbar paraspinals; lats bilt as well as ant/lat/post cervical musculature; pecs; upper trap; leveator; teres; cervical and thoracic paraspinals                    [facility] Adult PT Treatment/Exercise - 04/20/19 0001      Lumbar Exercises: Aerobic   Nustep  L5 increasing to 7 x 8 min UE (11)       Lumbar Exercises: Seated   Other Seated Lumbar Exercises  anterior/posterior pelvic tilt cues to keep shoulders still - seated on dyna disc 20-30 reps with increased movement in lumbar spine noted with no pain or discomfort reported - good positioin of upper body. Trial of lateral pelvic movement which was very difficult and "stiff"      Shoulder Exercises:  Seated   Other Seated Exercises  lateral cervical flexion stretch with chin tucks 15 sec x 3 each side     Other Seated Exercises  axial extensin 10 sec x 5 reps; cervical rotation 10 sec x 2 each side       Moist Heat Therapy   Number Minutes Moist Heat  20 Minutes    Moist Heat Location  Lumbar Spine;Cervical   thoracic spine      Electrical Stimulation   Electrical Stimulation Location  bilat lumbar/SI     Electrical Stimulation Action  IFC    Electrical Stimulation Parameters  to tolerance     Electrical Stimulation Goals  Tone;Pain             PT Education - 04/20/19 1548    Education Details  Education re- importance os continuing with independent HEP    Person(s) Educated  Patient    Methods  Explanation    Comprehension  Verbalized understanding          PT Long Term Goals - 04/02/19 0839      PT LONG TERM GOAL #1   Title  Improve posture and alignment with patient to demonstrate more upright posture/position of head over chest/shoudlers allowing improved cervical positioning and improved movement quality thus decreasing pain and improving function 05/14/2019    Time  8    Period  Weeks    Status  Revised      PT LONG TERM GOAL #2   Title  Increase cervical ROM in lateral flexion and rotation by 5-8 degrees 05/14/2019    Time  8    Period  Weeks    Status  Revised      PT LONG TERM GOAL #3   Title  Patient to report decreased stiffness with movement and improved tolerance for sitting to 10-15 min with minimal to no increase in pain 05/14/2019    Time  8    Period  Weeks    Status  Revised      PT LONG TERM GOAL #4   Title  Independent in HEP with good program to improve mobility 05/14/2019  Time  8    Period  Weeks    Status  Revised      PT LONG TERM GOAL #5   Title  Improve FOTO to </= 43% limitation 05/14/2019    Time  8    Period  Weeks    Status  Revised      PT LONG TERM GOAL #6   Title  Improve trunk and LE mobility to allow patient to perform  functional activities with less pain and greater ease - (ex - putting shoes on) 05/14/2019    Time  6    Period  Weeks    Status  New      PT LONG TERM GOAL #7   Title  Patient to report ability to get up for lying down or sitting with 50-75% less pain 05/14/2019    Time  6    Period  Weeks    Status  New            Plan - 04/20/19 1516    Clinical Impression Statement  Continued excellent progress with cervical and lumbar rehab. Patient reports that he is now pain free in the neck and low back. He has occasional "twinges" in the neck and some stiffness in the LB but no pain. He is working on his exercises daily at home and is pleased with his progress. Patient demonstrates increased spinal mobliity/ROM with improved muscular tightness to palpation. He has increased segmental spine moblity through the lumbar region. Patient has accomplished FOTO goal indicating good functional gains with ADL's. He is working well toward all rehab goals. Anticipate d/c to independent HEP in 1-2 visits.    Rehab Potential  Good    PT Frequency  2x / week    PT Duration  6 weeks    PT Treatment/Interventions  ADLs/Self Care Home Management;Patient/family education;Cryotherapy;Electrical Stimulation;Iontophoresis 4mg /ml Dexamethasone;Moist Heat;Traction;Ultrasound;Neuromuscular re-education;Dry needling;Manual techniques;Therapeutic activities;Therapeutic exercise;Passive range of motion    PT Next Visit Plan  review HEP; continue treatment of LBP with exercise, manual work and modalities as indicated; work on thoracic extension; spinal mobs; manual work thoracic and cervical spine; posterior shoulder girdle strengthening; modaliites as indicated - not a fan of DN for lumbar spine Anticipate d/c to independent HEP in 1-2 visits.    PT Home Exercise Plan  Access Code: [email or web address]     Consulted and Agree with Plan of Care  Patient       Patient will benefit from skilled therapeutic intervention in order to improve  the following deficits and impairments:  Postural dysfunction, Improper body mechanics, Pain, Increased fascial restricitons, Increased muscle spasms, Hypomobility, Decreased range of motion, Decreased mobility, Decreased activity tolerance  Visit Diagnosis: Cervicalgia  Other symptoms and signs involving the musculoskeletal system  Abnormal posture  Chronic right-sided low back pain without sciatica     Problem List Patient Active Problem List   Diagnosis Date Noted  . DNR (do not resuscitate) 07/24/2018  . 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 Lateral epicondylitis of right elbow 01/21/2018  . Thyroid nodule 11/16/2017  . Carotid arterial disease ([place]) 11/14/2017  . Seborrheic keratoses 11/07/2016  . Acute medial meniscus tear of left knee 05/15/2016  . Internal hemorrhoids 04/12/2015  . 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 Essential hypertension 08/11/2013  . Controlled type 2 diabetes mellitus without complication, without long-term current use of insulin ([place]) 12/17/2012  . History of nephrolithiasis 12/17/2012  . Vitamin D deficiency 12/17/2012  . Excessive cerumen in ear canal  12/17/2012  . H/O degenerative disc disease 12/17/2012  . 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 Family history of colon cancer 12/17/2012  . Hyperlipidemia 12/12/2012    Jamica Woodyard Nilda Simmer PT, MPH  04/20/2019, 3:53 PM  [facility] [place], [place], [address] Phone: [phone]   Fax:  [phone]  Name: Kevin Wood MRN: [id number] Date of Birth: 08-04-1946

## 2019-04-22 ENCOUNTER — Ambulatory Visit: Payer: Medicare HMO | Admitting: Physical Therapy

## 2019-04-22 ENCOUNTER — Other Ambulatory Visit: Payer: Self-pay

## 2019-04-22 ENCOUNTER — Encounter: Payer: Self-pay | Admitting: Physical Therapy

## 2019-04-22 DIAGNOSIS — M542 Cervicalgia: Secondary | ICD-10-CM | POA: Diagnosis not present

## 2019-04-22 DIAGNOSIS — M545 Low back pain, unspecified: Secondary | ICD-10-CM

## 2019-04-22 DIAGNOSIS — R29898 Other symptoms and signs involving the musculoskeletal system: Secondary | ICD-10-CM | POA: Diagnosis not present

## 2019-04-22 DIAGNOSIS — G8929 Other chronic pain: Secondary | ICD-10-CM

## 2019-04-22 DIAGNOSIS — R293 Abnormal posture: Secondary | ICD-10-CM | POA: Diagnosis not present

## 2019-04-22 NOTE — Therapy (Addendum)
[place], [place], [address] Phone: [phone]   Fax:  [phone]  Physical Therapy Treatment  Patient Details  Name: Kevin Wood MRN: [id number] Date of Birth: 18-Sep-1946 Referring Provider (PT): Dr Lynne Leader    Encounter Date: 04/22/2019  PT End of Session - 04/22/19 1205    Visit Number  11    Number of Visits  20    Date for PT Re-Evaluation  05/13/19    PT Start Time  1200    PT Stop Time  1239    PT Time Calculation (min)  39 min    Activity Tolerance  Patient tolerated treatment well       Past Medical History:  Diagnosis Date  . Elevated blood pressure 12/12/2012  . H/O degenerative disc disease 12/17/2012  . History of nephrolithiasis 12/17/2012  . Hyperlipidemia 12/12/2012  . Lumbar vertebral fracture ([place]) [place]  . Type 2 diabetes mellitus ([place]) 12/17/2012  . Unspecified vitamin D deficiency 12/17/2012   Outside Records     Past Surgical History:  Procedure Laterality Date  . c spine fustion  1994  . colon cancer  father  . GALLBLADDER SURGERY  2012    There were no vitals filed for this visit.  Subjective Assessment - 04/22/19 1205    Subjective  Pt reports he hasn't had pain in a week.  He has some stiffness in his low back and neck.  He reports he is ready to put therapy on hold while he continues work on [employer].    Currently in Pain?  No/denies    Pain Score  0-No pain         [facility] PT Assessment - 04/22/19 0001      Assessment   Medical Diagnosis  Cervical dysfunction     Referring Provider (PT)  Dr Lynne Leader     Onset Date/Surgical Date  02/18/19    Hand Dominance  Left    Next MD Visit  PRN     Prior Therapy  yes here 1/19-2/19; 2/20-3/20        [facility] Adult PT Treatment/Exercise - 04/22/19 0001      Lumbar Exercises: Stretches   Other Lumbar Stretch Exercise  supine lat stretch with PTA assist 20-45 sec hold x 3 reps       Lumbar  Exercises: Aerobic   Nustep  L5: 7 min UE (arms at 11)       Lumbar Exercises: Seated   Other Seated Lumbar Exercises  anterior/posterior pelvic tilt cues to keep shoulders still - seated on dyna disc 20-30 reps with increased movement in lumbar spine noted with no pain or discomfort      Moist Heat Therapy   Number Minutes Moist Heat  20 Minutes    Moist Heat Location  Lumbar Spine;Cervical   thoracic spine      Electrical Stimulation   Electrical Stimulation Location  bilat lumbar/SI     Electrical Stimulation Action  IFC    Electrical Stimulation Parameters  to tolerance    Electrical Stimulation Goals  Pain;Tone      Manual Therapy   Manual Therapy  Soft tissue mobilization;Myofascial release;Passive ROM    Manual therapy comments  pt in supine  (12 min performed while LB was on MHP/estim)    Soft tissue mobilization  bilat cervical paraspinals, upper trap, scalenes, SCM (focused more on Rt)    Myofascial Release  suboccipital release; MFR to  Rt scm and scalenes.     Passive ROM  cervical flexion and lateral flexion      Verbally reviewed HEP.     PT Long Term Goals - 04/22/19 1237      PT LONG TERM GOAL #1   Title  Improve posture and alignment with patient to demonstrate more upright posture/position of head over chest/shoudlers allowing improved cervical positioning and improved movement quality thus decreasing pain and improving function 05/14/2019    Time  8    Period  Weeks    Status  Partially Met      PT LONG TERM GOAL #2   Title  Increase cervical ROM in lateral flexion and rotation by 5-8 degrees 05/14/2019    Time  8    Period  Weeks    Status  On-going      PT LONG TERM GOAL #3   Title  Patient to report decreased stiffness with movement and improved tolerance for sitting to 10-15 min with minimal to no increase in pain 05/14/2019    Time  8    Period  Weeks    Status  Achieved      PT LONG TERM GOAL #4   Title  Independent in HEP with good program to  improve mobility 05/14/2019    Time  8    Period  Weeks    Status  On-going      PT LONG TERM GOAL #5   Title  Improve FOTO to </= 43% limitation 05/14/2019    Time  8    Period  Weeks    Status  Achieved      PT LONG TERM GOAL #6   Title  Improve trunk and LE mobility to allow patient to perform functional activities with less pain and greater ease - (ex - putting shoes on) 05/14/2019    Time  6    Period  Weeks    Status  Achieved      PT LONG TERM GOAL #7   Title  Patient to report ability to get up for lying down or sitting with 50-75% less pain 05/14/2019    Time  6    Period  Weeks    Status  Achieved            Plan - 04/22/19 1248    Clinical Impression Statement  Reviewed current HEP with patient. Pt tolerated the short exercise session well, without increase in symptoms.  Pt reported reduction in neck stiffness after manual therapy/ MHP.  Pt has partially met his goals and requests to hold therapy.    Rehab Potential  Good    PT Frequency  2x / week    PT Duration  6 weeks    PT Treatment/Interventions  ADLs/Self Care Home Management;Patient/family education;Cryotherapy;Electrical Stimulation;Iontophoresis 72m/ml Dexamethasone;Moist Heat;Traction;Ultrasound;Neuromuscular re-education;Dry needling;Manual techniques;Therapeutic activities;Therapeutic exercise;Passive range of motion    PT Next Visit Plan  hold therapy per pt request.  if pt doesnt' return by 05/22/19, will d/c.    PT Home Exercise Plan  Access Code: [email or web address]     Consulted and Agree with Plan of Care  Patient       Patient will benefit from skilled therapeutic intervention in order to improve the following deficits and impairments:  Postural dysfunction, Improper body mechanics, Pain, Increased fascial restricitons, Increased muscle spasms, Hypomobility, Decreased range of motion, Decreased mobility, Decreased activity tolerance  Visit Diagnosis: 1. Cervicalgia   2. Chronic right-sided low back pain  without sciatica  3. Other symptoms and signs involving the musculoskeletal system   4. Abnormal posture        Problem List Patient Active Problem List   Diagnosis Date Noted  . DNR (do not resuscitate) 07/24/2018  . 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 Lateral epicondylitis of right elbow 01/21/2018  . Thyroid nodule 11/16/2017  . Carotid arterial disease ([place]) 11/14/2017  . Seborrheic keratoses 11/07/2016  . Acute medial meniscus tear of left knee 05/15/2016  . Internal hemorrhoids 04/12/2015  . 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 Essential hypertension 08/11/2013  . Controlled type 2 diabetes mellitus without complication, without long-term current use of insulin ([place]) 12/17/2012  . History of nephrolithiasis 12/17/2012  . Vitamin D deficiency 12/17/2012  . Excessive cerumen in ear canal 12/17/2012  . H/O degenerative disc disease 12/17/2012  . 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 Family history of colon cancer 12/17/2012  . Hyperlipidemia 12/12/2012   Kerin Perna, PTA 04/22/19 1:00 PM  [place], [place], [address] Phone: [phone]   Fax:  [phone]  Name: Vedanth Sirico MRN: [id number] Date of Birth: April 13, 1946  PHYSICAL THERAPY DISCHARGE SUMMARY  Visits from Start of Care: 11  Current functional level related to goals / functional outcomes: See progress note for discharge status.   Remaining deficits: No known deficits - needs to continue with consistent HEP    Education / Equipment: HEP  Plan: Patient agrees to discharge.  Patient goals were met. Patient is being discharged due to meeting the stated rehab goals.  ?????    Celyn P. Helene Kelp PT, MPH 05/27/19 3:34 PM

## 2019-05-30 DIAGNOSIS — M546 Pain in thoracic spine: Secondary | ICD-10-CM | POA: Diagnosis not present

## 2019-05-30 DIAGNOSIS — M542 Cervicalgia: Secondary | ICD-10-CM | POA: Diagnosis not present

## 2019-05-30 DIAGNOSIS — M9903 Segmental and somatic dysfunction of lumbar region: Secondary | ICD-10-CM | POA: Diagnosis not present

## 2019-05-30 DIAGNOSIS — M9904 Segmental and somatic dysfunction of sacral region: Secondary | ICD-10-CM | POA: Diagnosis not present

## 2019-05-30 DIAGNOSIS — M25552 Pain in left hip: Secondary | ICD-10-CM | POA: Diagnosis not present

## 2019-05-30 DIAGNOSIS — M9901 Segmental and somatic dysfunction of cervical region: Secondary | ICD-10-CM | POA: Diagnosis not present

## 2019-05-30 DIAGNOSIS — M9905 Segmental and somatic dysfunction of pelvic region: Secondary | ICD-10-CM | POA: Diagnosis not present

## 2019-05-30 DIAGNOSIS — M545 Low back pain: Secondary | ICD-10-CM | POA: Diagnosis not present

## 2019-05-30 DIAGNOSIS — M25551 Pain in right hip: Secondary | ICD-10-CM | POA: Diagnosis not present

## 2019-06-01 DIAGNOSIS — M25552 Pain in left hip: Secondary | ICD-10-CM | POA: Diagnosis not present

## 2019-06-01 DIAGNOSIS — M546 Pain in thoracic spine: Secondary | ICD-10-CM | POA: Diagnosis not present

## 2019-06-01 DIAGNOSIS — M25551 Pain in right hip: Secondary | ICD-10-CM | POA: Diagnosis not present

## 2019-06-01 DIAGNOSIS — M9905 Segmental and somatic dysfunction of pelvic region: Secondary | ICD-10-CM | POA: Diagnosis not present

## 2019-06-01 DIAGNOSIS — M9903 Segmental and somatic dysfunction of lumbar region: Secondary | ICD-10-CM | POA: Diagnosis not present

## 2019-06-01 DIAGNOSIS — M542 Cervicalgia: Secondary | ICD-10-CM | POA: Diagnosis not present

## 2019-06-01 DIAGNOSIS — M545 Low back pain: Secondary | ICD-10-CM | POA: Diagnosis not present

## 2019-06-01 DIAGNOSIS — M9901 Segmental and somatic dysfunction of cervical region: Secondary | ICD-10-CM | POA: Diagnosis not present

## 2019-06-01 DIAGNOSIS — M9904 Segmental and somatic dysfunction of sacral region: Secondary | ICD-10-CM | POA: Diagnosis not present

## 2019-06-03 DIAGNOSIS — M9903 Segmental and somatic dysfunction of lumbar region: Secondary | ICD-10-CM | POA: Diagnosis not present

## 2019-06-03 DIAGNOSIS — M545 Low back pain: Secondary | ICD-10-CM | POA: Diagnosis not present

## 2019-06-03 DIAGNOSIS — M9904 Segmental and somatic dysfunction of sacral region: Secondary | ICD-10-CM | POA: Diagnosis not present

## 2019-06-03 DIAGNOSIS — M9905 Segmental and somatic dysfunction of pelvic region: Secondary | ICD-10-CM | POA: Diagnosis not present

## 2019-06-03 DIAGNOSIS — M25552 Pain in left hip: Secondary | ICD-10-CM | POA: Diagnosis not present

## 2019-06-03 DIAGNOSIS — M25551 Pain in right hip: Secondary | ICD-10-CM | POA: Diagnosis not present

## 2019-06-03 DIAGNOSIS — M9901 Segmental and somatic dysfunction of cervical region: Secondary | ICD-10-CM | POA: Diagnosis not present

## 2019-06-03 DIAGNOSIS — M542 Cervicalgia: Secondary | ICD-10-CM | POA: Diagnosis not present

## 2019-06-03 DIAGNOSIS — M546 Pain in thoracic spine: Secondary | ICD-10-CM | POA: Diagnosis not present

## 2019-06-08 DIAGNOSIS — M25552 Pain in left hip: Secondary | ICD-10-CM | POA: Diagnosis not present

## 2019-06-08 DIAGNOSIS — M546 Pain in thoracic spine: Secondary | ICD-10-CM | POA: Diagnosis not present

## 2019-06-08 DIAGNOSIS — M9905 Segmental and somatic dysfunction of pelvic region: Secondary | ICD-10-CM | POA: Diagnosis not present

## 2019-06-08 DIAGNOSIS — M542 Cervicalgia: Secondary | ICD-10-CM | POA: Diagnosis not present

## 2019-06-08 DIAGNOSIS — M9901 Segmental and somatic dysfunction of cervical region: Secondary | ICD-10-CM | POA: Diagnosis not present

## 2019-06-08 DIAGNOSIS — M25551 Pain in right hip: Secondary | ICD-10-CM | POA: Diagnosis not present

## 2019-06-08 DIAGNOSIS — M9903 Segmental and somatic dysfunction of lumbar region: Secondary | ICD-10-CM | POA: Diagnosis not present

## 2019-06-08 DIAGNOSIS — M9904 Segmental and somatic dysfunction of sacral region: Secondary | ICD-10-CM | POA: Diagnosis not present

## 2019-06-08 DIAGNOSIS — M545 Low back pain: Secondary | ICD-10-CM | POA: Diagnosis not present

## 2019-06-10 DIAGNOSIS — M546 Pain in thoracic spine: Secondary | ICD-10-CM | POA: Diagnosis not present

## 2019-06-10 DIAGNOSIS — M9905 Segmental and somatic dysfunction of pelvic region: Secondary | ICD-10-CM | POA: Diagnosis not present

## 2019-06-10 DIAGNOSIS — M9903 Segmental and somatic dysfunction of lumbar region: Secondary | ICD-10-CM | POA: Diagnosis not present

## 2019-06-10 DIAGNOSIS — M545 Low back pain: Secondary | ICD-10-CM | POA: Diagnosis not present

## 2019-06-10 DIAGNOSIS — M542 Cervicalgia: Secondary | ICD-10-CM | POA: Diagnosis not present

## 2019-06-10 DIAGNOSIS — M9904 Segmental and somatic dysfunction of sacral region: Secondary | ICD-10-CM | POA: Diagnosis not present

## 2019-06-10 DIAGNOSIS — M25552 Pain in left hip: Secondary | ICD-10-CM | POA: Diagnosis not present

## 2019-06-10 DIAGNOSIS — M9901 Segmental and somatic dysfunction of cervical region: Secondary | ICD-10-CM | POA: Diagnosis not present

## 2019-06-10 DIAGNOSIS — M25551 Pain in right hip: Secondary | ICD-10-CM | POA: Diagnosis not present

## 2019-06-12 DIAGNOSIS — M546 Pain in thoracic spine: Secondary | ICD-10-CM | POA: Diagnosis not present

## 2019-06-12 DIAGNOSIS — M9905 Segmental and somatic dysfunction of pelvic region: Secondary | ICD-10-CM | POA: Diagnosis not present

## 2019-06-12 DIAGNOSIS — M9903 Segmental and somatic dysfunction of lumbar region: Secondary | ICD-10-CM | POA: Diagnosis not present

## 2019-06-12 DIAGNOSIS — M545 Low back pain: Secondary | ICD-10-CM | POA: Diagnosis not present

## 2019-06-12 DIAGNOSIS — M9904 Segmental and somatic dysfunction of sacral region: Secondary | ICD-10-CM | POA: Diagnosis not present

## 2019-06-12 DIAGNOSIS — M542 Cervicalgia: Secondary | ICD-10-CM | POA: Diagnosis not present

## 2019-06-12 DIAGNOSIS — M25552 Pain in left hip: Secondary | ICD-10-CM | POA: Diagnosis not present

## 2019-06-12 DIAGNOSIS — M9901 Segmental and somatic dysfunction of cervical region: Secondary | ICD-10-CM | POA: Diagnosis not present

## 2019-06-12 DIAGNOSIS — M25551 Pain in right hip: Secondary | ICD-10-CM | POA: Diagnosis not present

## 2019-06-15 DIAGNOSIS — M9901 Segmental and somatic dysfunction of cervical region: Secondary | ICD-10-CM | POA: Diagnosis not present

## 2019-06-15 DIAGNOSIS — M546 Pain in thoracic spine: Secondary | ICD-10-CM | POA: Diagnosis not present

## 2019-06-15 DIAGNOSIS — M25551 Pain in right hip: Secondary | ICD-10-CM | POA: Diagnosis not present

## 2019-06-15 DIAGNOSIS — M25552 Pain in left hip: Secondary | ICD-10-CM | POA: Diagnosis not present

## 2019-06-15 DIAGNOSIS — M9903 Segmental and somatic dysfunction of lumbar region: Secondary | ICD-10-CM | POA: Diagnosis not present

## 2019-06-15 DIAGNOSIS — M9905 Segmental and somatic dysfunction of pelvic region: Secondary | ICD-10-CM | POA: Diagnosis not present

## 2019-06-15 DIAGNOSIS — M542 Cervicalgia: Secondary | ICD-10-CM | POA: Diagnosis not present

## 2019-06-15 DIAGNOSIS — M9904 Segmental and somatic dysfunction of sacral region: Secondary | ICD-10-CM | POA: Diagnosis not present

## 2019-06-15 DIAGNOSIS — M545 Low back pain: Secondary | ICD-10-CM | POA: Diagnosis not present

## 2019-06-17 DIAGNOSIS — M9904 Segmental and somatic dysfunction of sacral region: Secondary | ICD-10-CM | POA: Diagnosis not present

## 2019-06-17 DIAGNOSIS — M9903 Segmental and somatic dysfunction of lumbar region: Secondary | ICD-10-CM | POA: Diagnosis not present

## 2019-06-17 DIAGNOSIS — M25551 Pain in right hip: Secondary | ICD-10-CM | POA: Diagnosis not present

## 2019-06-17 DIAGNOSIS — M9901 Segmental and somatic dysfunction of cervical region: Secondary | ICD-10-CM | POA: Diagnosis not present

## 2019-06-17 DIAGNOSIS — M542 Cervicalgia: Secondary | ICD-10-CM | POA: Diagnosis not present

## 2019-06-17 DIAGNOSIS — M25552 Pain in left hip: Secondary | ICD-10-CM | POA: Diagnosis not present

## 2019-06-17 DIAGNOSIS — M545 Low back pain: Secondary | ICD-10-CM | POA: Diagnosis not present

## 2019-06-17 DIAGNOSIS — M9905 Segmental and somatic dysfunction of pelvic region: Secondary | ICD-10-CM | POA: Diagnosis not present

## 2019-06-17 DIAGNOSIS — M546 Pain in thoracic spine: Secondary | ICD-10-CM | POA: Diagnosis not present

## 2019-06-19 DIAGNOSIS — M545 Low back pain: Secondary | ICD-10-CM | POA: Diagnosis not present

## 2019-06-19 DIAGNOSIS — M542 Cervicalgia: Secondary | ICD-10-CM | POA: Diagnosis not present

## 2019-06-19 DIAGNOSIS — M9904 Segmental and somatic dysfunction of sacral region: Secondary | ICD-10-CM | POA: Diagnosis not present

## 2019-06-19 DIAGNOSIS — M9901 Segmental and somatic dysfunction of cervical region: Secondary | ICD-10-CM | POA: Diagnosis not present

## 2019-06-19 DIAGNOSIS — M25552 Pain in left hip: Secondary | ICD-10-CM | POA: Diagnosis not present

## 2019-06-19 DIAGNOSIS — M546 Pain in thoracic spine: Secondary | ICD-10-CM | POA: Diagnosis not present

## 2019-06-19 DIAGNOSIS — M9905 Segmental and somatic dysfunction of pelvic region: Secondary | ICD-10-CM | POA: Diagnosis not present

## 2019-06-19 DIAGNOSIS — M9903 Segmental and somatic dysfunction of lumbar region: Secondary | ICD-10-CM | POA: Diagnosis not present

## 2019-06-19 DIAGNOSIS — M25551 Pain in right hip: Secondary | ICD-10-CM | POA: Diagnosis not present

## 2019-06-22 ENCOUNTER — Other Ambulatory Visit: Payer: Self-pay

## 2019-06-22 ENCOUNTER — Encounter: Payer: Self-pay | Admitting: Family Medicine

## 2019-06-22 ENCOUNTER — Ambulatory Visit (INDEPENDENT_AMBULATORY_CARE_PROVIDER_SITE_OTHER): Payer: Medicare HMO | Admitting: Family Medicine

## 2019-06-22 VITALS — BP 140/71 | HR 75 | Temp 98.2°F | Wt 221.0 lb

## 2019-06-22 DIAGNOSIS — G8929 Other chronic pain: Secondary | ICD-10-CM | POA: Diagnosis not present

## 2019-06-22 DIAGNOSIS — M545 Low back pain, unspecified: Secondary | ICD-10-CM

## 2019-06-22 NOTE — Progress Notes (Signed)
Kevin Wood is a 73 y.o. male who presents to [facility] today for back pain.  Kevin Wood has chronic ongoing back pain.  He notes pain is primarily located in the low back and does not radiate down his legs.  He denies weakness or numbness distally.  He has had extensive conservative management for this including physical therapy and trials of different medications.  Ultimately he had an MRI at the [facility] which showed diffuse mild degenerative disease however most severe facet disease at L3-4.  At this point he notes chronic pain especially when he stands up from seated position.  He is able to get some relief with a TENS unit but has chronic pain is interfering with his quality of life.  He is caring for his wife who is suffering from Alzheimer's dementia.    ROS:  As above  Exam:  BP 140/71 (BP Location: Left Arm, Patient Position: Sitting, Cuff Size: Normal)   Pulse 75   Temp 98.2 F (36.8 C) (Oral)   Wt 221 lb (100.2 kg)   BMI 30.82 kg/m  Wt Readings from Last 5 Encounters:  06/22/19 221 lb (100.2 kg)  04/01/19 215 lb (97.5 kg)  03/24/19 215 lb (97.5 kg)  12/31/18 219 lb (99.3 kg)  07/24/18 226 lb (102.5 kg)   General: Well Developed, well nourished, and in no acute distress.  Neuro/Psych: Alert and oriented x3, extra-ocular muscles intact, able to move all 4 extremities, sensation grossly intact. Skin: Warm and dry, no rashes noted.  Respiratory: Not using accessory muscles, speaking in full sentences, trachea midline.  Cardiovascular: Pulses palpable, no extremity edema. Abdomen: Does not appear distended. MSK: L-spine: Nontender to midline.  Decreased lumbar range of motion.  Patient walks with a slight forward flexed lumbar spine.  However gait is otherwise normal.  Lower semi-strength is intact.    Lab and Radiology Results MRI L-spine report from the [facility] scanned into EMR reviewed.  Date of service was May 2020    Assessment  and Plan: 73 y.o. male with chronic low back pain failing conservative management.  At this point reasonable to proceed with facet injections.  Refer to Dr. Francesco Runner here in [place] based on patient location preference.  Dr. Francesco Runner may proceed with further injections or ablations as needed into the future.  Recheck back with me as needed.  Also informed patient to be transition into primarily sports medicine started in November.  Discussed transition to new PCP.  Patient's wife already sees Dr. Sheppard Coil so that is a logical fit.    PDMP not reviewed this encounter. Orders Placed This Encounter  Procedures  . Ambulatory referral to Pain Clinic    Referral Priority:   Routine    Referral Type:   Consultation    Referral Reason:   Specialty Services Required    Referred to Provider:   Dorene Ar, MD    Requested Specialty:   Pain Medicine    Number of Visits Requested:   1   No orders of the defined types were placed in this encounter.   Historical information moved to improve visibility of documentation.  Past Medical History:  Diagnosis Date  . Elevated blood pressure 12/12/2012  . H/O degenerative disc disease 12/17/2012  . History of nephrolithiasis 12/17/2012  . Hyperlipidemia 12/12/2012  . Lumbar vertebral fracture ([place]) [place]  . Type 2 diabetes mellitus ([place]) 12/17/2012  . Unspecified vitamin D deficiency 12/17/2012   Outside Records  Past Surgical History:  Procedure Laterality Date  . c spine fustion  1994  . colon cancer  father  . GALLBLADDER SURGERY  2012   Social History   Tobacco Use  . Smoking status: Never Smoker  . Smokeless tobacco: Never Used  Substance Use Topics  . Alcohol use: Yes    Alcohol/week: 1.0 standard drinks    Types: 1 Standard drinks or equivalent per week   family history includes Cancer in his father.  Medications: Current Outpatient Medications  Medication Sig Dispense Refill  . ibuprofen (ADVIL) 600 MG tablet  Take 600 mg by mouth every 6 (six) hours as needed.    Marland Kitchen lisinopril-hydrochlorothiazide (PRINZIDE,ZESTORETIC) 20-12.5 MG tablet Take 1 tablet by mouth daily. 90 tablet 3  . methocarbamol (ROBAXIN) 500 MG tablet Take 1 tablet (500 mg total) by mouth every 8 (eight) hours as needed for muscle spasms. 90 tablet 0  . simvastatin (ZOCOR) 40 MG tablet Take 1 tablet (40 mg total) by mouth daily. 90 tablet 3   No current facility-administered medications for this visit.    No Known Allergies    Discussed warning signs or symptoms. Please see discharge instructions. Patient expresses understanding.

## 2019-06-22 NOTE — Patient Instructions (Signed)
Thank you for coming in today. You should hear from Dr Isabelle Course office about injections.  Let me know if you have any problems getting an appointment.   Facet Blocks Patient Information  Description: The facets are joints in the spine between the vertebrae.  Like any joints in the body, facets can become irritated and painful.  Arthritis can also effect the facets.  By injecting steroids and local anesthetic in and around these joints, we can temporarily block the nerve supply to them.  Steroids act directly on irritated nerves and tissues to reduce selling and inflammation which often leads to decreased pain.  Facet blocks may be done anywhere along the spine from the neck to the low back depending upon the location of your pain.   After numbing the skin with local anesthetic (like Novocaine), a small needle is passed onto the facet joints under x-ray guidance.  You may experience a sensation of pressure while this is being done.  The entire block usually lasts about 15-25 minutes.   Conditions which may be treated by facet blocks:   Low back/buttock pain  Neck/shoulder pain  Certain types of headaches  Preparation for the injection:  1. Do not eat any solid food or dairy products within 8 hours of your appointment. 2. You may drink clear liquid up to 3 hours before appointment.  Clear liquids include water, black coffee, juice or soda.  No milk or cream please. 3. You may take your regular medication, including pain medications, with a sip of water before your appointment.  Diabetics should hold regular insulin (if taken separately) and take 1/2 normal NPH dose the morning of the procedure.  Carry some sugar containing items with you to your appointment. 4. A driver must accompany you and be prepared to drive you home after your procedure. 5. Bring all your current medications with you. 6. An IV may be inserted and sedation may be given at the discretion of the physician. 7. A blood  pressure cuff, EKG and other monitors will often be applied during the procedure.  Some patients may need to have extra oxygen administered for a short period. 8. You will be asked to provide medical information, including your allergies and medications, prior to the procedure.  We must know immediately if you are taking blood thinners (like Coumadin/Warfarin) or if you are allergic to IV iodine contrast (dye).  We must know if you could possible be pregnant.  Possible side-effects:   Bleeding from needle site  Infection (rare, may require surgery)  Nerve injury (rare)  Numbness & tingling (temporary)  Difficulty urinating (rare, temporary)  Spinal headache (a headache worse with upright posture)  Light-headedness (temporary)  Pain at injection site (serveral days)  Decreased blood pressure (rare, temporary)  Weakness in arm/leg (temporary)  Pressure sensation in back/neck (temporary)   Call if you experience:   Fever/chills associated with headache or increased back/neck pain  Headache worsened by an upright position  New onset, weakness or numbness of an extremity below the injection site  Hives or difficulty breathing (go to the emergency room)  Inflammation or drainage at the injection site(s)  Severe back/neck pain greater than usual  New symptoms which are concerning to you  Please note:  Although the local anesthetic injected can often make your back or neck feel good for several hours after the injection, the pain will likely return. It takes 3-7 days for steroids to work.  You may not notice any pain relief for  at least one week.  If effective, we will often do a series of 2-3 injections spaced 3-6 weeks apart to maximally decrease your pain.  After the initial series, you may be a candidate for a more permanent nerve block of the facets.  If you have any questions, please call #[phone]) [facility]

## 2019-07-06 DIAGNOSIS — M545 Low back pain: Secondary | ICD-10-CM | POA: Diagnosis not present

## 2019-07-06 DIAGNOSIS — M7918 Myalgia, other site: Secondary | ICD-10-CM | POA: Diagnosis not present

## 2019-07-06 DIAGNOSIS — M5136 Other intervertebral disc degeneration, lumbar region: Secondary | ICD-10-CM | POA: Diagnosis not present

## 2019-07-06 DIAGNOSIS — G894 Chronic pain syndrome: Secondary | ICD-10-CM | POA: Diagnosis not present

## 2019-07-06 DIAGNOSIS — G8929 Other chronic pain: Secondary | ICD-10-CM | POA: Diagnosis not present

## 2019-07-06 DIAGNOSIS — M47816 Spondylosis without myelopathy or radiculopathy, lumbar region: Secondary | ICD-10-CM | POA: Diagnosis not present

## 2019-08-03 ENCOUNTER — Other Ambulatory Visit: Payer: Self-pay

## 2019-08-03 ENCOUNTER — Ambulatory Visit (INDEPENDENT_AMBULATORY_CARE_PROVIDER_SITE_OTHER): Payer: Medicare HMO | Admitting: Family Medicine

## 2019-08-03 ENCOUNTER — Encounter: Payer: Self-pay | Admitting: Family Medicine

## 2019-08-03 VITALS — BP 131/70 | HR 82 | Temp 98.2°F | Wt 218.0 lb

## 2019-08-03 DIAGNOSIS — H6123 Impacted cerumen, bilateral: Secondary | ICD-10-CM

## 2019-08-03 DIAGNOSIS — N6082 Other benign mammary dysplasias of left breast: Secondary | ICD-10-CM

## 2019-08-03 DIAGNOSIS — Z23 Encounter for immunization: Secondary | ICD-10-CM

## 2019-08-03 MED ORDER — TETANUS-DIPHTH-ACELL PERTUSSIS 5-2-15.5 LF-MCG/0.5 IM SUSP: 0.5000 mL | Freq: Once | INTRAMUSCULAR | 0 refills | Status: AC

## 2019-08-03 NOTE — Progress Notes (Signed)
Kevin Wood is a 73 y.o. male who presents to [facility] today for ear pressure.  Patient has a several day sensation of ear congestion pressure and decreased hearing for few days.  He thinks his ears may be obstructed with wax.  He is tried over-the-counter medications which have not helped.  Additionally he notes a small mass at his left inferior nipple.  He notes this is been there for years and slightly enlarging over the last few years.  Is not bothersome.  He denies any nipple discharge or pain.  No fevers or chills.    ROS as above:  Exam:  BP 131/70   Pulse 82   Temp 98.2 F (36.8 C) (Oral)   Wt 218 lb (98.9 kg)   BMI 30.40 kg/m  Wt Readings from Last 5 Encounters:  08/03/19 218 lb (98.9 kg)  06/22/19 221 lb (100.2 kg)  04/01/19 215 lb (97.5 kg)  03/24/19 215 lb (97.5 kg)  12/31/18 219 lb (99.3 kg)    Gen: Well NAD HEENT: EOMI,  MMM tympanic membrane's bilaterally normal-appearing ear canal with some wax however mostly free of wax.  No erythema. Lungs: Normal work of breathing. CTABL Heart: RRR no MRG Abd: NABS, Soft. Nondistended, Nontender Exts: Brisk capillary refill, warm and well perfused.  Skin: Left nipple small 2 mm nodule subcutaneous at 7 o'clock position.  Nontender.  Mobile.  Cerumen impaction bilaterally was relieved with irrigation by LPN   Assessment and Plan: 73 y.o. male with  Cerumen impaction relieved with irrigation.  Watchful waiting.  Use Debrox eardrops over-the-counter as needed.  Subcutaneous nodule at nipple.  Very likely sebaceous cyst.  Watchful waiting proceed with excisional biopsy if needed.  Recommend Dr. Darene Lamer for this role.  Patient due for Tdap vaccine.  Will prescribe to pharmacy to administer.  Recheck as needed.  I informed patient that I am transitioning to sports medicine only [facility]  in [place] starting in November.  Happy to see patient for continued sports medicine needs.  Discussed need for new PCP.  Provided some recommendations.   PDMP not reviewed this encounter. No orders of the defined types were placed in this encounter.  Meds ordered this encounter  Medications  . Tdap (ADACEL) 03-06-14.5 LF-MCG/0.5 injection    Sig: Inject 0.5 mLs into the muscle once for 1 dose.    Dispense:  0.5 mL    Refill:  0     Historical information moved to improve visibility of documentation.  Past Medical History:  Diagnosis Date  . Elevated blood pressure 12/12/2012  . H/O degenerative disc disease 12/17/2012  . History of nephrolithiasis 12/17/2012  . Hyperlipidemia 12/12/2012  . Lumbar vertebral fracture ([place]) [place]  . Type 2 diabetes mellitus ([place]) 12/17/2012  . Unspecified vitamin D deficiency 12/17/2012   Outside Records    Past Surgical History:  Procedure Laterality Date  . c spine fustion  1994  . colon cancer  father  . GALLBLADDER SURGERY  2012   Social History   Tobacco Use  . Smoking status: Never Smoker  . Smokeless tobacco: Never Used  Substance Use Topics  . Alcohol use: Yes    Alcohol/week: 1.0 standard drinks    Types: 1 Standard drinks or equivalent per week   family history includes Cancer in his father.  Medications: Current Outpatient Medications  Medication Sig Dispense Refill  . ibuprofen (ADVIL) 600 MG tablet  Take 600 mg by mouth every 6 (six) hours as needed.    Marland Kitchen lisinopril-hydrochlorothiazide (PRINZIDE,ZESTORETIC) 20-12.5 MG tablet Take 1 tablet by mouth daily. 90 tablet 3  . methocarbamol (ROBAXIN) 500 MG tablet Take 1 tablet (500 mg total) by mouth every 8 (eight) hours as needed for muscle spasms. 90 tablet 0  . simvastatin (ZOCOR) 40 MG tablet Take 1 tablet (40 mg total) by mouth daily. 90 tablet 3  . Tdap (ADACEL) 03-06-14.5 LF-MCG/0.5 injection Inject 0.5 mLs into the muscle once for 1 dose. 0.5 mL 0   No current  facility-administered medications for this visit.    No Known Allergies   Discussed warning signs or symptoms. Please see discharge instructions. Patient expresses understanding.

## 2019-08-03 NOTE — Patient Instructions (Addendum)
Thank you for coming in today.  Keep me updated.  Ok to use Debrox ear drops as needed.  Recheck as needed.  Get the Tdap injection at the pharmacy.  Let me know when you get your Tdap vaccine ad eye exam.

## 2019-08-10 ENCOUNTER — Ambulatory Visit: Payer: Medicare HMO

## 2019-08-24 NOTE — Progress Notes (Deleted)
Subjective:   Kevin Wood is a 73 y.o. male who presents for Medicare Annual/Subsequent preventive examination.  Review of Systems:  No ROS.  Medicare Wellness Virtual Visit.  Visual/audio telehealth visit, UTA vital signs.   See social history for additional risk factors.       Sleep patterns:    Home Safety/Smoke Alarms: Feels safe in home. Smoke alarms in place.  Living environment;  Seat Belt Safety/Bike Helmet: Wears seat belt.    Male:   CCS- UTD    PSA-  UTD 0.6 Lab Results  Component Value Date   PSA 0.6 03/24/2019   PSA 0.4 07/16/2017   PSA 0.49 03/03/2015        Objective:    Vitals: There were no vitals taken for this visit.  There is no height or weight on file to calculate BMI.  Advanced Directives 03/19/2019 01/07/2019 11/26/2017 11/14/2016  Does Patient Have a Medical Advance Directive? Yes Yes No No  Type of Paramedic of [place];Living will [place];Living will - -  Copy of [place] in Chart? No - copy requested No - copy requested - -  Would patient like information on creating a medical advance directive? - - No - Patient declined No - Patient declined    Tobacco Social History   Tobacco Use  Smoking Status Never Smoker  Smokeless Tobacco Never Used     Counseling given: Not Answered   Clinical Intake:                       Past Medical History:  Diagnosis Date  . Elevated blood pressure 12/12/2012  . H/O degenerative disc disease 12/17/2012  . History of nephrolithiasis 12/17/2012  . Hyperlipidemia 12/12/2012  . Lumbar vertebral fracture ([place]) [place]  . Type 2 diabetes mellitus ([place]) 12/17/2012  . Unspecified vitamin D deficiency 12/17/2012   Outside Records    Past Surgical History:  Procedure Laterality Date  . c spine fustion  1994  . colon cancer  father  . GALLBLADDER SURGERY  2012   Family History  Problem Relation Age of Onset  .  Cancer Father        colon cancer   Social History   Socioeconomic History  . Marital status: Married    Spouse name: Not on file  . Number of children: Not on file  . Years of education: Not on file  . Highest education level: Not on file  Occupational History  . Not on file  Social Needs  . Financial resource strain: Not on file  . Food insecurity    Worry: Not on file    Inability: Not on file  . Transportation needs    Medical: Not on file    Non-medical: Not on file  Tobacco Use  . Smoking status: Never Smoker  . Smokeless tobacco: Never Used  Substance and Sexual Activity  . Alcohol use: Yes    Alcohol/week: 1.0 standard drinks    Types: 1 Standard drinks or equivalent per week  . Drug use: No  . Sexual activity: Yes  Lifestyle  . Physical activity    Days per week: Not on file    Minutes per session: Not on file  . Stress: Not on file  Relationships  . Social Herbalist on phone: Not on file    Gets together: Not on file    Attends religious service:  Not on file    Active member of club or organization: Not on file    Attends meetings of clubs or organizations: Not on file    Relationship status: Not on file  Other Topics Concern  . Not on file  Social History Narrative  . Not on file    Outpatient Encounter Medications as of 08/25/2019  Medication Sig  . ibuprofen (ADVIL) 600 MG tablet Take 600 mg by mouth every 6 (six) hours as needed.  Marland Kitchen lisinopril-hydrochlorothiazide (PRINZIDE,ZESTORETIC) 20-12.5 MG tablet Take 1 tablet by mouth daily.  . methocarbamol (ROBAXIN) 500 MG tablet Take 1 tablet (500 mg total) by mouth every 8 (eight) hours as needed for muscle spasms.  . simvastatin (ZOCOR) 40 MG tablet Take 1 tablet (40 mg total) by mouth daily.   No facility-administered encounter medications on file as of 08/25/2019.     Activities of Daily Living No flowsheet data found.  Patient Care Team: Gregor Hams, MD as PCP - General (Family  Medicine) Gerome Sam, MD as Referring Physician (Internal Medicine)   Assessment:   This is a routine wellness examination for Kevin Wood.Physical assessment deferred to PCP.   Exercise Activities and Dietary recommendations   Diet  Breakfast: Lunch:  Dinner:       Goals   None     Fall Risk Fall Risk  07/17/2018 07/16/2017 07/12/2016 03/03/2015 01/25/2014  Falls in the past year? No No No No No   Is the patient's home free of loose throw rugs in walkways, pet beds, electrical cords, etc?   {Blank single:19197::"yes","no"}      Grab bars in the bathroom? {Blank single:19197::"yes","no"}      Handrails on the stairs?   {Blank single:19197::"yes","no"}      Adequate lighting?   {Blank single:19197::"yes","no"}  Depression Screen PHQ 2/9 Scores 07/17/2018 07/16/2017 07/12/2016 03/03/2015  PHQ - 2 Score 0 0 0 0    Cognitive Function MMSE - Mini Mental State Exam 07/12/2016  Orientation to time 5  Orientation to Place 5  Registration 3  Attention/ Calculation 5  Recall 2  Language- name 2 objects 2  Language- repeat 1  Language- follow 3 step command 3  Language- read & follow direction 1  Write a sentence 1  Copy design 1  Total score 29     6CIT Screen 07/24/2018  What Year? 0 points  What month? 0 points  Count back from 20 0 points  Months in reverse 0 points  Repeat phrase 0 points    Immunization History  Administered Date(s) Administered  . Influenza, High Dose Seasonal PF 07/12/2016, 07/16/2017  . Influenza,inj,Quad PF,6+ Mos 07/02/2013  . Influenza-Unspecified 10/11/2011, 08/20/2015, 07/24/2018, 07/23/2019  . PPD Test 12/01/2014  . Pneumococcal Conjugate-13 03/03/2015  . Pneumococcal Polysaccharide-23 06/05/2016  . Tdap 07/25/2009  . Zoster 08/24/2013  . Zoster Recombinat (Shingrix) 08/15/2017, 11/28/2017    Screening Tests Health Maintenance  Topic Date Due  . FOOT EXAM  07/18/2019  . TETANUS/TDAP  07/26/2019  . OPHTHALMOLOGY EXAM  08/23/2019   . HEMOGLOBIN A1C  09/24/2019  . COLONOSCOPY  12/17/2021  . INFLUENZA VACCINE  Completed  . Hepatitis C Screening  Completed  . PNA vac Low Risk Adult  Completed        Plan:   ***  I have personally reviewed and noted the following in the patient's chart:   . Medical and social history . Use of alcohol, tobacco or illicit drugs  . Current medications and supplements . Functional  ability and status . Nutritional status . Physical activity . Advanced directives . List of other physicians . Hospitalizations, surgeries, and ER visits in previous 12 months . Vitals . Screenings to include cognitive, depression, and falls . Referrals and appointments  In addition, I have reviewed and discussed with patient certain preventive protocols, quality metrics, and best practice recommendations. A written personalized care plan for preventive services as well as general preventive health recommendations were provided to patient.     Joanne Chars, LPN  [id number]

## 2019-08-25 ENCOUNTER — Ambulatory Visit: Payer: Medicare HMO

## 2019-08-25 LAB — HM DIABETES EYE EXAM

## 2019-08-26 LAB — HM DIABETES EYE EXAM

## 2019-09-02 ENCOUNTER — Other Ambulatory Visit: Payer: Self-pay

## 2019-09-02 ENCOUNTER — Ambulatory Visit (INDEPENDENT_AMBULATORY_CARE_PROVIDER_SITE_OTHER): Payer: Medicare HMO | Admitting: *Deleted

## 2019-09-02 VITALS — BP 125/74 | HR 68 | Ht 72.0 in | Wt 216.0 lb

## 2019-09-02 DIAGNOSIS — Z0000 Encounter for general adult medical examination without abnormal findings: Secondary | ICD-10-CM

## 2019-09-02 NOTE — Patient Instructions (Signed)
Please schedule your next medicare wellness visit with me in 1 yr.  Kevin Wood , Thank you for taking time to come for your Medicare Wellness Visit. I appreciate your ongoing commitment to your health goals. Please review the following plan we discussed and let me know if I can assist you in the future.   These are the goals we discussed: Goals    . 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

## 2019-09-02 NOTE — Progress Notes (Signed)
Subjective:   Kevin Wood is a 73 y.o. male who presents for Medicare Annual (Subsequent) preventive examination.  Review of Systems:  No ROS.  Medicare Wellness Virtual Visit.  Visual/audio telehealth visit, UTA vital signs.   See social history for additional risk factors.    Cardiac Risk Factors include: advanced age (>42men, >83 women);diabetes mellitus;dyslipidemia  Sleep patterns: Getting 7.5 hours of sleep a night. Wakes up 1 time to void occasionally not all the time. Wakes up feel rested and ready for the day.   Home Safety/Smoke Alarms: Feels safe in home. Smoke alarms in place.  Living environment; Lives with wife in 1 story home no stairs in or around the home. Shower is a walk in shower and grab bars are in place. Seat Belt Safety/Bike Helmet: Wears seat belt.    Male:   CCS-  UTD  PSA-  UTD Lab Results  Component Value Date   PSA 0.6 03/24/2019   PSA 0.4 07/16/2017   PSA 0.49 03/03/2015        Objective:     Vitals: BP 125/74   Pulse 68   Ht 6' (1.829 m)   Wt 216 lb (98 kg)   BMI 29.29 kg/m   Body mass index is 29.29 kg/m.  Advanced Directives 09/02/2019 03/19/2019 01/07/2019 11/26/2017 11/14/2016  Does Patient Have a Medical Advance Directive? Yes Yes Yes No No  Type of Paramedic of [place];Living will [place];Living will [place];Living will - -  Does patient want to make changes to medical advance directive? No - Patient declined - - - -  Copy of [place] in Chart? No - copy requested No - copy requested No - copy requested - -  Would patient like information on creating a medical advance directive? - - - No - Patient declined No - Patient declined    Tobacco Social History   Tobacco Use  Smoking Status Never Smoker  Smokeless Tobacco Never Used     Counseling given: No   Clinical Intake:  Pre-visit preparation completed: Yes  Pain : No/denies  pain     Nutritional Risks: None Diabetes: Yes CBG done?: No(at home 155 FBS) Did pt. bring in CBG monitor from home?: No  How often do you need to have someone help you when you read instructions, pamphlets, or other written materials from your doctor or pharmacy?: 1 - Never What is the last grade level you completed in school?: 15  Interpreter Needed?: No  Information entered by :: Orlie Dakin, LPN  Past Medical History:  Diagnosis Date  . Elevated blood pressure 12/12/2012  . H/O degenerative disc disease 12/17/2012  . History of nephrolithiasis 12/17/2012  . Hyperlipidemia 12/12/2012  . Lumbar vertebral fracture ([place]) [place]  . Type 2 diabetes mellitus ([place]) 12/17/2012  . Unspecified vitamin D deficiency 12/17/2012   Outside Records    Past Surgical History:  Procedure Laterality Date  . c spine fustion  1994  . colon cancer  father  . GALLBLADDER SURGERY  2012   Family History  Problem Relation Age of Onset  . Cancer Father        colon cancer   Social History   Socioeconomic History  . Marital status: Married    Spouse name: [name]  . Number of children: 1  . Years of education: 41  . Highest education level: Bachelor's degree (e.g., BA, AB, BS)  Occupational History  .  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 Financial resource strain: Not hard at all  . Food insecurity    Worry: Never true    Inability: Never true  . Transportation needs    Medical: No    Non-medical: No  Tobacco Use  . Smoking status: Never Smoker  . Smokeless tobacco: Never Used  Substance and Sexual Activity  . Alcohol use: Yes    Alcohol/week: 1.0 standard drinks    Types: 1 Standard drinks or equivalent per week    Comment: occasionally  . Drug use: No  . Sexual activity: Yes  Lifestyle  . Physical activity    Days per week: 7 days    Minutes per session: 80 min  . Stress: Not at all  Relationships  . Social connections     Talks on phone: More than three times a week    Gets together: More than three times a week    Attends religious service: More than 4 times per year    Active member of club or organization: No    Attends meetings of clubs or organizations: Never    Relationship status: Married  Other Topics Concern  . Not on file  Social History Narrative   Walks dogs daily 2 times a day for 80 minutes total of walking.    Outpatient Encounter Medications as of 09/02/2019  Medication Sig  . ibuprofen (ADVIL) 600 MG tablet Take 600 mg by mouth every 6 (six) hours as needed.  Marland Kitchen lisinopril-hydrochlorothiazide (PRINZIDE,ZESTORETIC) 20-12.5 MG tablet Take 1 tablet by mouth daily.  . simvastatin (ZOCOR) 40 MG tablet Take 1 tablet (40 mg total) by mouth daily.  . [DISCONTINUED] methocarbamol (ROBAXIN) 500 MG tablet Take 1 tablet (500 mg total) by mouth every 8 (eight) hours as needed for muscle spasms. (Patient not taking: Reported on 09/02/2019)   No facility-administered encounter medications on file as of 09/02/2019.     Activities of Daily Living In your present state of health, do you have any difficulty performing the following activities: 09/02/2019  Hearing? Y  Comment notices some ringing in his ears. Has scheduled hearing test at [facility]? N  Difficulty concentrating or making decisions? N  Walking or climbing stairs? N  Dressing or bathing? N  Doing errands, shopping? N  Preparing Food and eating ? N  Using the Toilet? N  In the past six months, have you accidently leaked urine? N  Do you have problems with loss of bowel control? N  Managing your Medications? N  Managing your Finances? N  Housekeeping or managing your Housekeeping? N  Some recent data might be hidden    Patient Care Team: Gregor Hams, MD as PCP - General (Family Medicine) Gerome Sam, MD as Referring Physician (Internal Medicine)    Assessment:   This is a routine wellness examination for  [name].Physical assessment deferred to PCP.   Exercise Activities and Dietary recommendations Current Exercise Habits: Home exercise routine, Type of exercise: walking, Time (Minutes): > 60, Frequency (Times/Week): 7, Weekly Exercise (Minutes/Week): 0, Intensity: Moderate, Exercise limited by: None identified Diet Eats a healthy diet of fruits vegetables and proteins. Breakfast: Biscuit with sausage and egg and OJ Lunch: sandwich  Dinner: Meat and vegetables with salad. Drinks water all day long. Denies any caffeine use.      Goals    . 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       Fall Risk Fall Risk  09/02/2019 07/17/2018 07/16/2017 07/12/2016 03/03/2015  Falls in the past year? 0 No No No No  Number falls in past yr: 0 - - - -  Injury with Fall? 0 - - - -  Follow up Falls prevention discussed - - - -   Is the patient's home free of loose throw rugs in walkways, pet beds, electrical cords, etc?   yes      Grab bars in the bathroom? yes      Handrails on the stairs?   no      Adequate lighting?   yes   Depression Screen PHQ 2/9 Scores 09/02/2019 07/17/2018 07/16/2017 07/12/2016  PHQ - 2 Score 0 0 0 0     Cognitive Function MMSE - Mini Mental State Exam 07/12/2016  Orientation to time 5  Orientation to Place 5  Registration 3  Attention/ Calculation 5  Recall 2  Language- name 2 objects 2  Language- repeat 1  Language- follow 3 step command 3  Language- read & follow direction 1  Write a sentence 1  Copy design 1  Total score 29     6CIT Screen 09/02/2019 07/24/2018  What Year? 0 points 0 points  What month? 0 points 0 points  What time? 0 points -  Count back from 20 0 points 0 points  Months in reverse 0 points 0 points  Repeat phrase 0 points 0 points  Total Score 0 -    Immunization History  Administered Date(s) Administered  . Influenza, High Dose Seasonal PF 07/12/2016, 07/16/2017  . Influenza,inj,Quad PF,6+ Mos  07/02/2013  . Influenza-Unspecified 10/11/2011, 08/20/2015, 07/24/2018, 07/23/2019  . PPD Test 12/01/2014  . Pneumococcal Conjugate-13 03/03/2015  . Pneumococcal Polysaccharide-23 06/05/2016  . Tdap 07/25/2009  . Zoster 08/24/2013  . Zoster Recombinat (Shingrix) 08/15/2017, 11/28/2017    Screening Tests Health Maintenance  Topic Date Due  . FOOT EXAM  07/18/2019  . TETANUS/TDAP  07/26/2019  . OPHTHALMOLOGY EXAM  08/23/2019  . HEMOGLOBIN A1C  09/24/2019  . COLONOSCOPY  12/17/2021  . INFLUENZA VACCINE  Completed  . Hepatitis C Screening  Completed  . PNA vac Low Risk Adult  Completed       Plan:    Please schedule your next medicare wellness visit with me in 1 yr.  Mr. Gurry , Thank you for taking time to come for your Medicare Wellness Visit. I appreciate your ongoing commitment to your health goals. Please review the following plan we discussed and let me know if I can assist you in the future.   These are the goals we discussed: Goals    . 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       This is a list of the screening recommended for you and due dates:  Health Maintenance  Topic Date Due  . Complete foot exam   07/18/2019  . Tetanus Vaccine  07/26/2019  . Eye exam for diabetics  08/23/2019  . Hemoglobin A1C  09/24/2019  . Colon Cancer Screening  12/17/2021  . Flu Shot  Completed  .  Hepatitis C: One time screening is recommended by Center for Disease Control  (CDC) for  adults born from [birth year] through 1965.   Completed  . Pneumonia vaccines  Completed      I have personally reviewed and noted the following in the  patient's chart:   . Medical and social history . Use of alcohol, tobacco or illicit drugs  . Current medications and supplements . Functional ability and status . Nutritional status . Physical activity . Advanced directives . List of other physicians . Hospitalizations, surgeries, and ER visits in previous  12 months . Vitals . Screenings to include cognitive, depression, and falls . Referrals and appointments  In addition, I have reviewed and discussed with patient certain preventive protocols, quality metrics, and best practice recommendations. A written personalized care plan for preventive services as well as general preventive health recommendations were provided to patient.     Joanne Chars, LPN  [id number]

## 2019-11-11 ENCOUNTER — Other Ambulatory Visit: Payer: Self-pay | Admitting: Family Medicine

## 2019-11-12 NOTE — Telephone Encounter (Signed)
Needs appointment

## 2019-11-19 ENCOUNTER — Encounter: Payer: Self-pay | Admitting: Family Medicine

## 2020-01-20 ENCOUNTER — Encounter: Payer: Self-pay | Admitting: Family Medicine

## 2020-01-20 ENCOUNTER — Other Ambulatory Visit: Payer: Self-pay

## 2020-01-20 ENCOUNTER — Ambulatory Visit (INDEPENDENT_AMBULATORY_CARE_PROVIDER_SITE_OTHER): Payer: Medicare HMO | Admitting: Family Medicine

## 2020-01-20 VITALS — BP 119/71 | HR 69 | Temp 98.1°F | Ht 72.0 in | Wt 219.0 lb

## 2020-01-20 DIAGNOSIS — E119 Type 2 diabetes mellitus without complications: Secondary | ICD-10-CM

## 2020-01-20 DIAGNOSIS — E782 Mixed hyperlipidemia: Secondary | ICD-10-CM

## 2020-01-20 DIAGNOSIS — I10 Essential (primary) hypertension: Secondary | ICD-10-CM | POA: Diagnosis not present

## 2020-01-20 LAB — POCT GLYCOSYLATED HEMOGLOBIN (HGB A1C): Hemoglobin A1C: 7.3 % — AB (ref 4.0–5.6)

## 2020-01-20 MED ORDER — SIMVASTATIN 40 MG PO TABS: 40.0000 mg | ORAL_TABLET | Freq: Every day | ORAL | 1 refills | Status: DC

## 2020-01-20 NOTE — Patient Instructions (Addendum)
Great to meet you today! I would recommend adding the metformin on to help with control of your blood sugars.  Continue to remain active and follow a healthy diet.  Please follow up with me in about 6 months.

## 2020-01-20 NOTE — Assessment & Plan Note (Signed)
Tolerating simvastatin well. Continue at current dose. I ask that he bring in copies of recent labs from [place].

## 2020-01-20 NOTE — Assessment & Plan Note (Signed)
Blood pressure is at goal at for age and co-morbidities.  I recommend he continue lisinopril/hctz at current strength.  In addition they were instructed to follow a low sodium diet with regular exercise to help to maintain adequate control of blood pressure.

## 2020-01-20 NOTE — Progress Notes (Signed)
Kevin Wood - 74 y.o. male MRN [id number]  Date of birth: 11-Jul-1946  Subjective Chief Complaint  Patient presents with  . Diabetes    HPI Kevin Wood is a 74 y.o. male with history of T2DM, HTN and HLD here today for follow up.    -T2DM:  He has controlled this fairly well with dietary and lifestyle changes however A1c was elevated at 7.9% during recent visit with primary care doctor through the [place]. His primary recommended he start metformin.  He does not want to start medication so wanted to have a1c checked here to confirm that it is elevated.  He denies symptoms including increased thirst and urination.    -HTN:  Current management with lisinopril/hctz.  This is prescribed by [facility] provider.  BP readings have been good at home.  He denies chest pain, shortness of breath, palpitations, headache or vision changes.    -HLD:  Tolerating simvastatin well.  Denies myalgias from medication.  He reports having full set of labs completed through [place] recently.   ROS:  A comprehensive ROS was completed and negative except as noted per HPI  No Known Allergies  Past Medical History:  Diagnosis Date  . Elevated blood pressure 12/12/2012  . H/O degenerative disc disease 12/17/2012  . History of nephrolithiasis 12/17/2012  . Hyperlipidemia 12/12/2012  . Lumbar vertebral fracture ([place]) [place]  . Type 2 diabetes mellitus ([place]) 12/17/2012  . Unspecified vitamin D deficiency 12/17/2012   Outside Records     Past Surgical History:  Procedure Laterality Date  . c spine fustion  1994  . colon cancer  father  . GALLBLADDER SURGERY  2012    Social History   Socioeconomic History  . Marital status: Married    Spouse name: [name]  . Number of children: 1  . Years of education: 56  . Highest education level: Bachelor's degree (e.g., BA, AB, BS)  Occupational History  . 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 Smoking status: Never Smoker  .  Smokeless tobacco: Never Used  Substance and Sexual Activity  . Alcohol use: Yes    Alcohol/week: 1.0 standard drinks    Types: 1 Standard drinks or equivalent per week    Comment: occasionally  . Drug use: No  . Sexual activity: Yes  Other Topics Concern  . Not on file  Social History Narrative   Walks dogs daily 2 times a day for 80 minutes total of walking.   Social Determinants of Health   Financial Resource Strain: Low Risk   . Difficulty of Paying Living Expenses: Not hard at all  Food Insecurity: No Food Insecurity  . Worried About Charity fundraiser in the Last Year: Never true  . Ran Out of Food in the Last Year: Never true  Transportation Needs: No Transportation Needs  . Lack of Transportation (Medical): No  . Lack of Transportation (Non-Medical): No  Physical Activity: Sufficiently Active  . Days of Exercise per Week: 7 days  . Minutes of Exercise per Session: 80 min  Stress: No Stress Concern Present  . Feeling of Stress : Not at all  Social Connections: Slightly Isolated  . Frequency of Communication with Friends and Family: More than three times a week  . Frequency of Social Gatherings with Friends and Family: More than three times a week  . Attends Religious Services: More than 4 times per year  . Active Member of Clubs or Organizations:  No  . Attends Club or Organization Meetings: Never  . Marital Status: Married    Family History  Problem Relation Age of Onset  . Cancer Father        colon cancer    Health Maintenance  Topic Date Due  . OPHTHALMOLOGY EXAM  08/23/2019  . TETANUS/TDAP  01/19/2021 (Originally 07/26/2019)  . FOOT EXAM  01/19/2022 (Originally 07/18/2019)  . HEMOGLOBIN A1C  07/22/2020  . COLONOSCOPY  12/17/2021  . INFLUENZA VACCINE  Completed  . Hepatitis C Screening  Completed  . PNA vac Low Risk Adult  Completed      ----------------------------------------------------------------------------------------------------------------------------------------------------------------------------------------------------------------- Physical Exam BP 119/71   Pulse 69   Temp 98.1 F (36.7 C) (Oral)   Ht 6' (1.829 m)   Wt 219 lb (99.3 kg)   BMI 29.70 kg/m   Physical Exam Constitutional:      Appearance: Normal appearance.  HENT:     Head: Normocephalic and atraumatic.     Mouth/Throat:     Mouth: Mucous membranes are moist.  Eyes:     General: No scleral icterus. Cardiovascular:     Rate and Rhythm: Normal rate and regular rhythm.  Pulmonary:     Effort: Pulmonary effort is normal.     Breath sounds: Normal breath sounds.  Skin:    General: Skin is warm.  Neurological:     General: No focal deficit present.     Mental Status: He is alert.  Psychiatric:        Mood and Affect: Mood normal.        Behavior: Behavior normal.     ------------------------------------------------------------------------------------------------------------------------------------------------------------------------------------------------------------------- Assessment and Plan  Essential hypertension Blood pressure is at goal at for age and co-morbidities.  I recommend he continue lisinopril/hctz at current strength.  In addition they were instructed to follow a low sodium diet with regular exercise to help to maintain adequate control of blood pressure.    Controlled type 2 diabetes mellitus without complication, without long-term current use of insulin ([place]) Most recent A1c of  Lab Results  Component Value Date   HGBA1C 7.3 (A) 01/20/2020  Worsening control of diabetes. I recommended he start metformin prescribed through [place].  Counseled on healthy, low carb diet and recommend frequent activity to help with maintaining good control of blood sugars.    Hyperlipidemia Tolerating simvastatin well. Continue  at current dose. I ask that he bring in copies of recent labs from [place].     Meds ordered this encounter  Medications  . simvastatin (ZOCOR) 40 MG tablet    Sig: Take 1 tablet (40 mg total) by mouth daily.    Dispense:  90 tablet    Refill:  1    Return in about 6 months (around 07/22/2020) for T2DM/HLD.    This visit occurred during the SARS-CoV-2 public health emergency.  Safety protocols were in place, including screening questions prior to the visit, additional usage of staff PPE, and extensive cleaning of exam room while observing appropriate contact time as indicated for disinfecting solutions.

## 2020-01-20 NOTE — Assessment & Plan Note (Signed)
Most recent A1c of  Lab Results  Component Value Date   HGBA1C 7.3 (A) 01/20/2020  Worsening control of diabetes. I recommended he start metformin prescribed through [place].  Counseled on healthy, low carb diet and recommend frequent activity to help with maintaining good control of blood sugars.

## 2020-02-02 ENCOUNTER — Other Ambulatory Visit: Payer: Self-pay | Admitting: Family Medicine

## 2020-02-02 ENCOUNTER — Telehealth: Payer: Self-pay | Admitting: Family Medicine

## 2020-02-02 DIAGNOSIS — Z8739 Personal history of other diseases of the musculoskeletal system and connective tissue: Secondary | ICD-10-CM

## 2020-02-02 DIAGNOSIS — M4306 Spondylolysis, lumbar region: Secondary | ICD-10-CM

## 2020-02-02 NOTE — Progress Notes (Signed)
Refer

## 2020-02-02 NOTE — Telephone Encounter (Signed)
Referral entered  

## 2020-02-02 NOTE — Telephone Encounter (Signed)
Patient called and needs a new referral to Dr. Francesco Runner for his back injections. He reports that his back is hurting him again. Please advise.

## 2020-02-03 DIAGNOSIS — E7800 Pure hypercholesterolemia, unspecified: Secondary | ICD-10-CM | POA: Diagnosis not present

## 2020-02-03 DIAGNOSIS — R52 Pain, unspecified: Secondary | ICD-10-CM | POA: Diagnosis not present

## 2020-02-03 DIAGNOSIS — I10 Essential (primary) hypertension: Secondary | ICD-10-CM | POA: Diagnosis not present

## 2020-02-03 DIAGNOSIS — Z79899 Other long term (current) drug therapy: Secondary | ICD-10-CM | POA: Diagnosis not present

## 2020-02-03 DIAGNOSIS — R609 Edema, unspecified: Secondary | ICD-10-CM | POA: Diagnosis not present

## 2020-02-03 DIAGNOSIS — Z043 Encounter for examination and observation following other accident: Secondary | ICD-10-CM | POA: Diagnosis not present

## 2020-02-03 DIAGNOSIS — M545 Low back pain: Secondary | ICD-10-CM | POA: Diagnosis not present

## 2020-02-03 DIAGNOSIS — W19XXXA Unspecified fall, initial encounter: Secondary | ICD-10-CM | POA: Diagnosis not present

## 2020-02-03 DIAGNOSIS — G8911 Acute pain due to trauma: Secondary | ICD-10-CM | POA: Diagnosis not present

## 2020-02-10 DIAGNOSIS — M47816 Spondylosis without myelopathy or radiculopathy, lumbar region: Secondary | ICD-10-CM | POA: Diagnosis not present

## 2020-02-10 DIAGNOSIS — M545 Low back pain: Secondary | ICD-10-CM | POA: Diagnosis not present

## 2020-02-24 DIAGNOSIS — M545 Low back pain: Secondary | ICD-10-CM | POA: Diagnosis not present

## 2020-02-24 DIAGNOSIS — M47816 Spondylosis without myelopathy or radiculopathy, lumbar region: Secondary | ICD-10-CM | POA: Diagnosis not present

## 2020-05-10 DIAGNOSIS — M47816 Spondylosis without myelopathy or radiculopathy, lumbar region: Secondary | ICD-10-CM | POA: Insufficient documentation

## 2020-07-18 ENCOUNTER — Ambulatory Visit (INDEPENDENT_AMBULATORY_CARE_PROVIDER_SITE_OTHER): Payer: Medicare HMO | Admitting: Family Medicine

## 2020-07-18 ENCOUNTER — Encounter: Payer: Self-pay | Admitting: Family Medicine

## 2020-07-18 VITALS — BP 137/74 | HR 75 | Ht 72.05 in | Wt 215.0 lb

## 2020-07-18 DIAGNOSIS — M47816 Spondylosis without myelopathy or radiculopathy, lumbar region: Secondary | ICD-10-CM

## 2020-07-18 DIAGNOSIS — Z23 Encounter for immunization: Secondary | ICD-10-CM | POA: Diagnosis not present

## 2020-07-18 DIAGNOSIS — S91319A Laceration without foreign body, unspecified foot, initial encounter: Secondary | ICD-10-CM | POA: Insufficient documentation

## 2020-07-18 DIAGNOSIS — S91311A Laceration without foreign body, right foot, initial encounter: Secondary | ICD-10-CM

## 2020-07-18 NOTE — Assessment & Plan Note (Signed)
Recommend follow up with Dr. Francesco Runner initially to see if anything additional may be done.  He is interested in pursuing surgical intervention if nothing more can be done from pain management perspective.

## 2020-07-18 NOTE — Progress Notes (Signed)
Kevin Wood - 74 y.o. male MRN [id number]  Date of birth: 1946-09-26  Subjective Chief Complaint  Patient presents with  . Foot Injury  . Back Pain    HPI Kevin Wood is a 74 y.o. male here today for to discuss back pain and foot injury.    He was walking his dog last week and tripped and fell into briars.  He hit foot on step and cut open.  He had quite a bit of bleeding and he applied duct tape to control this.  He had bleeding for a couple of days and then started using liquid band aid and neosporin.  He denies increased pain, redness or drainage.  He is up to date on his tetanus vaccine.   He also has had increased back pain.  Pain located on R side.  Had ablation in June with Dr. Francesco Runner. This worked well until recently.  He isn't sure if he needs to f/u with Dr. Francesco Runner or not.    ROS:  A comprehensive ROS was completed and negative except as noted per HPI  No Known Allergies  Past Medical History:  Diagnosis Date  . Elevated blood pressure 12/12/2012  . H/O degenerative disc disease 12/17/2012  . History of nephrolithiasis 12/17/2012  . Hyperlipidemia 12/12/2012  . Lumbar vertebral fracture ([place]) [place]  . Type 2 diabetes mellitus ([place]) 12/17/2012  . Unspecified vitamin D deficiency 12/17/2012   Outside Records     Past Surgical History:  Procedure Laterality Date  . c spine fustion  1994  . colon cancer  father  . GALLBLADDER SURGERY  2012    Social History   Socioeconomic History  . Marital status: Married    Spouse name: [name]  . Number of children: 1  . Years of education: 3  . Highest education level: Bachelor's degree (e.g., BA, AB, BS)  Occupational History  . 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 Smoking status: Never Smoker  . Smokeless tobacco: Never Used  Vaping Use  . Vaping Use: Never used  Substance and Sexual Activity  . Alcohol use: Yes    Alcohol/week: 1.0 standard drink    Types: 1  Standard drinks or equivalent per week    Comment: occasionally  . Drug use: No  . Sexual activity: Yes  Other Topics Concern  . Not on file  Social History Narrative   Walks dogs daily 2 times a day for 80 minutes total of walking.   Social Determinants of Health   Financial Resource Strain: Low Risk   . Difficulty of Paying Living Expenses: Not hard at all  Food Insecurity: No Food Insecurity  . Worried About Charity fundraiser in the Last Year: Never true  . Ran Out of Food in the Last Year: Never true  Transportation Needs: No Transportation Needs  . Lack of Transportation (Medical): No  . Lack of Transportation (Non-Medical): No  Physical Activity: Sufficiently Active  . Days of Exercise per Week: 7 days  . Minutes of Exercise per Session: 80 min  Stress: No Stress Concern Present  . Feeling of Stress : Not at all  Social Connections: Moderately Integrated  . Frequency of Communication with Friends and Family: More than three times a week  . Frequency of Social Gatherings with Friends and Family: More than three times a week  . Attends Religious Services: More than 4 times per year  . Active Member of Clubs  or Organizations: No  . Attends Archivist Meetings: Never  . Marital Status: Married    Family History  Problem Relation Age of Onset  . Cancer Father        colon cancer    Health Maintenance  Topic Date Due  . OPHTHALMOLOGY EXAM  08/23/2019  . TETANUS/TDAP  01/19/2021 (Originally 07/26/2019)  . FOOT EXAM  01/19/2022 (Originally 07/18/2019)  . HEMOGLOBIN A1C  07/22/2020  . COLONOSCOPY  12/17/2021  . INFLUENZA VACCINE  Completed  . COVID-19 Vaccine  Completed  . Hepatitis C Screening  Completed  . PNA vac Low Risk Adult  Completed      ----------------------------------------------------------------------------------------------------------------------------------------------------------------------------------------------------------------- Physical Exam BP 137/74 (BP Location: Left Arm, Patient Position: Sitting, Cuff Size: Large)   Pulse 75   Ht 6' 0.05" (1.83 m)   Wt 215 lb (97.5 kg)   SpO2 96%   BMI 29.12 kg/m   Physical Exam Constitutional:      Appearance: Normal appearance.  Eyes:     General: No scleral icterus. Skin:    Comments: Small laceration to inside of R arch.  No increased swelling, drainage or tenderness.   Neurological:     General: No focal deficit present.     Mental Status: He is alert.  Psychiatric:        Mood and Affect: Mood normal.        Behavior: Behavior normal.     ------------------------------------------------------------------------------------------------------------------------------------------------------------------------------------------------------------------- Assessment and Plan  Spondylosis of lumbar region without myelopathy or radiculopathy Recommend follow up with Dr. Francesco Runner initially to see if anything additional may be done.  He is interested in pursuing surgical intervention if nothing more can be done from pain management perspective.   Laceration of foot Appears to be healing routinely.  No signs of infection at this time.  He will continue to keep covered.  Follow up if having any signs of infection.    No orders of the defined types were placed in this encounter.   No follow-ups on file.    This visit occurred during the SARS-CoV-2 public health emergency.  Safety protocols were in place, including screening questions prior to the visit, additional usage of staff PPE, and extensive cleaning of exam room while observing appropriate contact time as indicated for disinfecting solutions.

## 2020-07-18 NOTE — Patient Instructions (Signed)
Great to see you today! Keep an eye on the foot-Any increased redness, swelling or drainage let me know.  Follow up with Dr. Francesco Runner for the back.  If he doesn't feel like there is anything further for him to offer I think we can proceed with referral to orthopedics spine surgeon.

## 2020-07-18 NOTE — Assessment & Plan Note (Signed)
Appears to be healing routinely.  No signs of infection at this time.  He will continue to keep covered.  Follow up if having any signs of infection.

## 2020-08-05 ENCOUNTER — Other Ambulatory Visit: Payer: Self-pay | Admitting: Family Medicine

## 2020-09-01 LAB — HM DIABETES EYE EXAM

## 2020-09-15 ENCOUNTER — Encounter: Payer: Self-pay | Admitting: Family Medicine

## 2020-09-15 ENCOUNTER — Telehealth (INDEPENDENT_AMBULATORY_CARE_PROVIDER_SITE_OTHER): Payer: Medicare HMO | Admitting: Family Medicine

## 2020-09-15 DIAGNOSIS — J329 Chronic sinusitis, unspecified: Secondary | ICD-10-CM | POA: Insufficient documentation

## 2020-09-15 DIAGNOSIS — J40 Bronchitis, not specified as acute or chronic: Secondary | ICD-10-CM | POA: Diagnosis not present

## 2020-09-15 MED ORDER — AZITHROMYCIN 250 MG PO TABS: ORAL_TABLET | ORAL | 0 refills | Status: DC

## 2020-09-15 NOTE — Progress Notes (Signed)
Kevin Wood - 74 y.o. male MRN [id number]  Date of birth: 1946-01-15   This visit type was conducted due to national recommendations for restrictions regarding the COVID-19 Pandemic (e.g. social distancing).  This format is felt to be most appropriate for this patient at this time.  All issues noted in this document were discussed and addressed.  No physical exam was performed (except for noted visual exam findings with Video Visits).  I discussed the limitations of evaluation and management by telemedicine and the availability of in person appointments. The patient expressed understanding and agreed to proceed.  I connected with@ on 09/15/20 at  3:00 PM EST by a video enabled telemedicine application and verified that I am speaking with the correct person using two identifiers.  Present at visit: Luetta Nutting, DO [name]   Patient Location: Home [place] [address]   Provider location:   [place]  No chief complaint on file.   HPI  Kevin Wood is a 74 y.o. male who presents via audio/video conferencing for a telehealth visit today.  He has complaint of congestion with drainage, sore throat and mild cough. Symptoms started about 2 weeks ago, but really haven't improved.  Phlegm is thick and yellow.    He has not had fever, chills, body aches, headache, nausea, vomiting, diarrhea.   ROS:  A comprehensive ROS was completed and negative except as noted per HPI  Past Medical History:  Diagnosis Date  . Elevated blood pressure 12/12/2012  . H/O degenerative disc disease 12/17/2012  . History of nephrolithiasis 12/17/2012  . Hyperlipidemia 12/12/2012  . Lumbar vertebral fracture ([place]) [place]  . Type 2 diabetes mellitus ([place]) 12/17/2012  . Unspecified vitamin D deficiency 12/17/2012   Outside Records     Past Surgical History:  Procedure Laterality Date  . c spine fustion  1994  . colon cancer  father  . GALLBLADDER SURGERY  2012    Family  History  Problem Relation Age of Onset  . Cancer Father        colon cancer    Social History   Socioeconomic History  . Marital status: Married    Spouse name: [name]  . Number of children: 1  . Years of education: 2  . Highest education level: Bachelor's degree (e.g., BA, AB, BS)  Occupational History  . 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 Smoking status: Never Smoker  . Smokeless tobacco: Never Used  Vaping Use  . Vaping Use: Never used  Substance and Sexual Activity  . Alcohol use: Yes    Alcohol/week: 1.0 standard drink    Types: 1 Standard drinks or equivalent per week    Comment: occasionally  . Drug use: No  . Sexual activity: Yes  Other Topics Concern  . Not on file  Social History Narrative   Walks dogs daily 2 times a day for 80 minutes total of walking.   Social Determinants of Health   Financial Resource Strain:   . Difficulty of Paying Living Expenses: Not on file  Food Insecurity:   . Worried About Charity fundraiser in the Last Year: Not on file  . Ran Out of Food in the Last Year: Not on file  Transportation Needs:   . Lack of Transportation (Medical): Not on file  . Lack of Transportation (Non-Medical): Not on file  Physical Activity:   . Days of Exercise per Week: Not on  file  . Minutes of Exercise per Session: Not on file  Stress:   . Feeling of Stress : Not on file  Social Connections:   . Frequency of Communication with Friends and Family: Not on file  . Frequency of Social Gatherings with Friends and Family: Not on file  . Attends Religious Services: Not on file  . Active Member of Clubs or Organizations: Not on file  . Attends Archivist Meetings: Not on file  . Marital Status: Not on file  Intimate Partner Violence:   . Fear of Current or Ex-Partner: Not on file  . Emotionally Abused: Not on file  . Physically Abused: Not on file  . Sexually Abused: Not on file     Current  Outpatient Medications:  .  azithromycin (ZITHROMAX) 250 MG tablet, Take 2 tabs on day 1, then 1 tab daily for days 2-5., Disp: 6 tablet, Rfl: 0 .  lisinopril-hydrochlorothiazide (PRINZIDE,ZESTORETIC) 20-12.5 MG tablet, Take 1 tablet by mouth daily., Disp: 90 tablet, Rfl: 3 .  metFORMIN (GLUCOPHAGE) 500 MG tablet, TAKE ONE TABLET BY MOUTH BEFORE BREAKFAST FOR DIABETES (ANNUAL KIDNEY FUNCTION TESTING IS NEEDED)  REMAIN WELL-HYDRATED ON THIS MEDICATION - OTHERWISE THERE IS A RISK OF  LACTIC ACIDOSIS (ACID-BASE DISTURBANCE) FOR DIABETES (ANNUAL KIDNEY FUNCTION TESTING IS NEEDED)  REMAIN WELL-HYDRATED ON THIS MEDICATION - OTHERWISE THERE IS A RISK OF  LACTIC ACIDOSIS (ACID-BASE DISTURBANCE), Disp: , Rfl:  .  simvastatin (ZOCOR) 40 MG tablet, TAKE 1 TABLET EVERY DAY, Disp: 90 tablet, Rfl: 1  EXAM:  VITALS per patient if applicable: There were no vitals taken for this visit.  GENERAL: alert, oriented, appears well and in no acute distress  HEENT: atraumatic, conjunttiva clear, no obvious abnormalities on inspection of external nose and ears  NECK: normal movements of the head and neck  LUNGS: on inspection no signs of respiratory distress, breathing rate appears normal, no obvious gross SOB, gasping or wheezing  CV: no obvious cyanosis  MS: moves all visible extremities without noticeable abnormality  PSYCH/NEURO: pleasant and cooperative, no obvious depression or anxiety, speech and thought processing grossly intact  ASSESSMENT AND PLAN:  Discussed the following assessment and plan:  Sinobronchitis Having prolonged symptoms with thickened, yellow mucus.  Start azithromycin Recommend increased fluids.  Contact clinic if this continues.       I discussed the assessment and treatment plan with the patient. The patient was provided an opportunity to ask questions and all were answered. The patient agreed with the plan and demonstrated an understanding of the instructions.   The patient  was advised to call back or seek an in-person evaluation if the symptoms worsen or if the condition fails to improve as anticipated.    Luetta Nutting, DO

## 2020-09-15 NOTE — Assessment & Plan Note (Signed)
Having prolonged symptoms with thickened, yellow mucus.  Start azithromycin Recommend increased fluids.  Contact clinic if this continues.

## 2020-10-24 ENCOUNTER — Telehealth: Payer: Self-pay

## 2020-10-24 NOTE — Telephone Encounter (Signed)
Pt lvm stating medication given for drainage didn't work. He would like a new medication sent to the pharmacy.

## 2020-10-24 NOTE — Telephone Encounter (Signed)
Last seen >1 month ago, if not improving let's have him f/u.

## 2020-10-24 NOTE — Telephone Encounter (Signed)
He's scheduled for tomorrow morning.

## 2020-10-25 ENCOUNTER — Other Ambulatory Visit: Payer: Self-pay

## 2020-10-25 ENCOUNTER — Encounter: Payer: Self-pay | Admitting: Family Medicine

## 2020-10-25 ENCOUNTER — Ambulatory Visit (INDEPENDENT_AMBULATORY_CARE_PROVIDER_SITE_OTHER): Payer: Medicare HMO | Admitting: Family Medicine

## 2020-10-25 DIAGNOSIS — J988 Other specified respiratory disorders: Secondary | ICD-10-CM | POA: Diagnosis not present

## 2020-10-25 NOTE — Assessment & Plan Note (Signed)
Recommend trying flonase and/or nasal saline.  Recommend increased fluids and humidifier at home.  Can try addition of cetirizine or claritin as well.  Instructed to let me know if symptoms persist despite these measures.

## 2020-10-25 NOTE — Progress Notes (Signed)
Kevin Wood - 74 y.o. male MRN [id number]  Date of birth: May 26, 1946  Subjective Chief Complaint  Patient presents with  . Cough    HPI Kevin Wood is a 74 y.o. male here today with complaint of upper chest congestion.  Present for a few weeks.  Tried z-pack but didn't really have improvement with this other than phlegm is now clear again.  He denies sinus/nasal congestion, fever, chills , body aches, nausea, vomiting, diarrhea, chest pain, wheezing or shortness of breath. [place] doctor suggested trying nasal saline, has not tried this yet.   ROS:  A comprehensive ROS was completed and negative except as noted per HPI  No Known Allergies  Past Medical History:  Diagnosis Date  . Elevated blood pressure 12/12/2012  . H/O degenerative disc disease 12/17/2012  . History of nephrolithiasis 12/17/2012  . Hyperlipidemia 12/12/2012  . Lumbar vertebral fracture ([place]) [place]  . Type 2 diabetes mellitus ([place]) 12/17/2012  . Unspecified vitamin D deficiency 12/17/2012   Outside Records     Past Surgical History:  Procedure Laterality Date  . c spine fustion  1994  . colon cancer  father  . GALLBLADDER SURGERY  2012    Social History   Socioeconomic History  . Marital status: Married    Spouse name: [name]  . Number of children: 1  . Years of education: 17  . Highest education level: Bachelor's degree (e.g., BA, AB, BS)  Occupational History  . 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 Smoking status: Never Smoker  . Smokeless tobacco: Never Used  Vaping Use  . Vaping Use: Never used  Substance and Sexual Activity  . Alcohol use: Yes    Alcohol/week: 1.0 standard drink    Types: 1 Standard drinks or equivalent per week    Comment: occasionally  . Drug use: No  . Sexual activity: Yes  Other Topics Concern  . Not on file  Social History Narrative   Walks dogs daily 2 times a day for 80 minutes total of walking.   Social Determinants  of Health   Financial Resource Strain: Not on file  Food Insecurity: Not on file  Transportation Needs: Not on file  Physical Activity: Not on file  Stress: Not on file  Social Connections: Not on file    Family History  Problem Relation Age of Onset  . Cancer Father        colon cancer    Health Maintenance  Topic Date Due  . OPHTHALMOLOGY EXAM  08/23/2019  . COVID-19 Vaccine (3 - Moderna risk 4-dose series) 01/15/2020  . HEMOGLOBIN A1C  07/22/2020  . TETANUS/TDAP  01/19/2021 (Originally 07/26/2019)  . FOOT EXAM  01/19/2022 (Originally 07/18/2019)  . COLONOSCOPY  12/17/2021  . INFLUENZA VACCINE  Completed  . Hepatitis C Screening  Completed  . PNA vac Low Risk Adult  Completed     ----------------------------------------------------------------------------------------------------------------------------------------------------------------------------------------------------------------- Physical Exam BP 121/75 (BP Location: Left Arm, Patient Position: Sitting, Cuff Size: Normal)   Pulse 73   Wt 212 lb 12.8 oz (96.5 kg)   SpO2 96%   BMI 28.82 kg/m   Physical Exam Constitutional:      Appearance: Normal appearance.  HENT:     Head: Normocephalic and atraumatic.  Eyes:     General: No scleral icterus. Cardiovascular:     Rate and Rhythm: Normal rate and regular rhythm.  Pulmonary:     Effort: Pulmonary effort is normal.  Breath sounds: Normal breath sounds.  Musculoskeletal:     Cervical back: Neck supple.  Neurological:     General: No focal deficit present.     Mental Status: He is alert.  Psychiatric:        Mood and Affect: Mood normal.        Behavior: Behavior normal.     ------------------------------------------------------------------------------------------------------------------------------------------------------------------------------------------------------------------- Assessment and Plan  Congestion of upper airway Recommend trying  flonase and/or nasal saline.  Recommend increased fluids and humidifier at home.  Can try addition of cetirizine or claritin as well.  Instructed to let me know if symptoms persist despite these measures.    No orders of the defined types were placed in this encounter.   No follow-ups on file.    This visit occurred during the SARS-CoV-2 public health emergency.  Safety protocols were in place, including screening questions prior to the visit, additional usage of staff PPE, and extensive cleaning of exam room while observing appropriate contact time as indicated for disinfecting solutions.

## 2021-01-13 ENCOUNTER — Other Ambulatory Visit: Payer: Self-pay

## 2021-01-13 ENCOUNTER — Ambulatory Visit (INDEPENDENT_AMBULATORY_CARE_PROVIDER_SITE_OTHER): Payer: Medicare HMO | Admitting: Family Medicine

## 2021-01-13 VITALS — BP 111/55 | HR 71 | Temp 98.5°F | Resp 16 | Ht 72.0 in | Wt 215.0 lb

## 2021-01-13 DIAGNOSIS — Z0000 Encounter for general adult medical examination without abnormal findings: Secondary | ICD-10-CM | POA: Diagnosis not present

## 2021-01-13 NOTE — Patient Instructions (Addendum)
[place] Maintenance Summary and Written Plan of Care  [name] ,  Thank you for allowing me to perform your Medicare Annual Wellness Visit and for your ongoing commitment to your health.   Health Maintenance & Immunization History Health Maintenance  Topic Date Due  . OPHTHALMOLOGY EXAM  01/13/2021 (Originally 10/19/2020)  . HEMOGLOBIN A1C  07/05/2021 (Originally 07/22/2020)  . COVID-19 Vaccine (4 - Booster for Moderna series) 03/22/2021  . FOOT EXAM  04/03/2021  . COLONOSCOPY (Pts 45-40yrs Insurance coverage will need to be confirmed)  12/17/2021  . TETANUS/TDAP  10/21/2029  . INFLUENZA VACCINE  Completed  . Hepatitis C Screening  Completed  . PNA vac Low Risk Adult  Completed  . HPV VACCINES  Aged Out   Immunization History  Administered Date(s) Administered  . Fluad Quad(high Dose 65+) 07/18/2020  . Influenza Split 08/05/2014, 08/05/2017  . Influenza, High Dose Seasonal PF 11/12/2012, 07/12/2016, 07/16/2017  . Influenza,inj,Quad PF,6+ Mos 07/02/2013  . Influenza-Unspecified 11/06/2007, 07/13/2008, 08/05/2009, 10/11/2011, 11/12/2012, 08/19/2013, 08/20/2015, 07/24/2018, 07/23/2019, 09/06/2019, 08/05/2020  . Moderna Sars-Covid-2 Vaccination 11/20/2019, 12/18/2019, 09/22/2020  . PPD Test 12/01/2014  . Pneumococcal Conjugate-13 04/13/2014, 03/03/2015  . Pneumococcal Polysaccharide-23 09/12/2015, 06/05/2016  . Pneumococcal-Unspecified 03/12/2011  . Td 10/22/2019  . Tdap 07/25/2009  . Zoster 08/24/2013  . Zoster Recombinat (Shingrix) 08/15/2017, 11/28/2017    These are the patient goals that we discussed: Goals Addressed              This Visit's Progress   .  Patient Stated (pt-stated)        01/13/2021 AWV Goal: Diabetes Management  . Patient will maintain an A1C level below 8.0 . Patient will not develop any diabetic foot complications . Patient will not experience any hypoglycemic episodes over the next 3 months . Patient will  notify our office of any CBG readings outside of the provider recommended range by calling [phone]. Marland Kitchen Patient will adhere to provider recommendations for diabetes management  Patient Self Management Activities . take all medications as prescribed and report any negative side effects . monitor and record blood sugar readings as directed . adhere to a low carbohydrate diet that incorporates lean proteins, vegetables, whole grains, low glycemic fruits . check feet daily noting any sores, cracks, injuries, or callous formations . see PCP or podiatrist if he notices any changes in his legs, feet, or toenails . Patient will visit PCP and have an A1C level checked every 3 to 6 months as directed  . have a yearly eye exam to monitor for vascular changes associated with diabetes and will request that the report be sent to his pcp.  . consult with his PCP regarding any changes in his health or new or worsening symptoms         This is a list of Health Maintenance Items that are overdue or due now: Foot exam, Hemoglobin A1C and Eye exam.  Orders/Referrals Placed Today: No orders of the defined types were placed in this encounter.  (Contact our referral department at [phone] if you have not spoken with someone about your referral appointment within the next 5 days)    Follow-up Plan . Follow-up with Luetta Nutting, DO as planned . Schedule your foot exam, eye exam and hemoglobin A1C at the [place].  Marland Kitchen Medicare wellness exam in one year.   Diabetes Mellitus and Foot Care Foot care is an important part of your health, especially when you have diabetes. Diabetes may cause you to have  problems because of poor blood flow (circulation) to your feet and legs, which can cause your skin to:  Become thinner and drier.  Break more easily.  Heal more slowly.  Peel and crack. You may also have nerve damage (neuropathy) in your legs and feet, causing decreased feeling in them. This means that you  may not notice minor injuries to your feet that could lead to more serious problems. Noticing and addressing any potential problems early is the best way to prevent future foot problems. How to care for your feet Foot hygiene  Wash your feet daily with warm water and mild soap. Do not use hot water. Then, pat your feet and the areas between your toes until they are completely dry. Do not soak your feet as this can dry your skin.  Trim your toenails straight across. Do not dig under them or around the cuticle. File the edges of your nails with an emery board or nail file.  Apply a moisturizing lotion or petroleum jelly to the skin on your feet and to dry, brittle toenails. Use lotion that does not contain alcohol and is unscented. Do not apply lotion between your toes.   Shoes and socks  Wear clean socks or stockings every day. Make sure they are not too tight. Do not wear knee-high stockings since they may decrease blood flow to your legs.  Wear shoes that fit properly and have enough cushioning. Always look in your shoes before you put them on to be sure there are no objects inside.  To break in new shoes, wear them for just a few hours a day. This prevents injuries on your feet. Wounds, scrapes, corns, and calluses  Check your feet daily for blisters, cuts, bruises, sores, and redness. If you cannot see the bottom of your feet, use a mirror or ask someone for help.  Do not cut corns or calluses or try to remove them with medicine.  If you find a minor scrape, cut, or break in the skin on your feet, keep it and the skin around it clean and dry. You may clean these areas with mild soap and water. Do not clean the area with peroxide, alcohol, or iodine.  If you have a wound, scrape, corn, or callus on your foot, look at it several times a day to make sure it is healing and not infected. Check for: ? Redness, swelling, or pain. ? Fluid or blood. ? Warmth. ? Pus or a bad smell.   General  tips  Do not cross your legs. This may decrease blood flow to your feet.  Do not use heating pads or hot water bottles on your feet. They may burn your skin. If you have lost feeling in your feet or legs, you may not know this is happening until it is too late.  Protect your feet from hot and cold by wearing shoes, such as at the beach or on hot pavement.  Schedule a complete foot exam at least once a year (annually) or more often if you have foot problems. Report any cuts, sores, or bruises to your health care provider immediately. Where to find more information  American Diabetes Association: www.diabetes.org  Association of Diabetes Care & Education Specialists: www.diabeteseducator.org Contact a health care provider if:  You have a medical condition that increases your risk of infection and you have any cuts, sores, or bruises on your feet.  You have an injury that is not healing.  You have redness on  your legs or feet.  You feel burning or tingling in your legs or feet.  You have pain or cramps in your legs and feet.  Your legs or feet are numb.  Your feet always feel cold.  You have pain around any toenails. Get help right away if:  You have a wound, scrape, corn, or callus on your foot and: ? You have pain, swelling, or redness that gets worse. ? You have fluid or blood coming from the wound, scrape, corn, or callus. ? Your wound, scrape, corn, or callus feels warm to the touch. ? You have pus or a bad smell coming from the wound, scrape, corn, or callus. ? You have a fever. ? You have a red line going up your leg. Summary  Check your feet every day for blisters, cuts, bruises, sores, and redness.  Apply a moisturizing lotion or petroleum jelly to the skin on your feet and to dry, brittle toenails.  Wear shoes that fit properly and have enough cushioning.  If you have foot problems, report any cuts, sores, or bruises to your health care provider  immediately.  Schedule a complete foot exam at least once a year (annually) or more often if you have foot problems. This information is not intended to replace advice given to you by your health care provider. Make sure you discuss any questions you have with your health care provider. Document Revised: 05/12/2020 Document Reviewed: 05/12/2020 Elsevier Patient Education  [place] Maintenance, Male Adopting a healthy lifestyle and getting preventive care are important in promoting health and wellness. Ask your health care provider about:  The right schedule for you to have regular tests and exams.  Things you can do on your own to prevent diseases and keep yourself healthy. What should I know about diet, weight, and exercise? Eat a healthy diet  Eat a diet that includes plenty of vegetables, fruits, low-fat dairy products, and lean protein.  Do not eat a lot of foods that are high in solid fats, added sugars, or sodium.   Maintain a healthy weight Body mass index (BMI) is a measurement that can be used to identify possible weight problems. It estimates body fat based on height and weight. Your health care provider can help determine your BMI and help you achieve or maintain a healthy weight. Get regular exercise Get regular exercise. This is one of the most important things you can do for your health. Most adults should:  Exercise for at least 150 minutes each week. The exercise should increase your heart rate and make you sweat (moderate-intensity exercise).  Do strengthening exercises at least twice a week. This is in addition to the moderate-intensity exercise.  Spend less time sitting. Even light physical activity can be beneficial. Watch cholesterol and blood lipids Have your blood tested for lipids and cholesterol at 73 years of age, then have this test every 5 years. You may need to have your cholesterol levels checked more often if:  Your lipid or  cholesterol levels are high.  You are older than 73 years of age.  You are at high risk for heart disease. What should I know about cancer screening? Many types of cancers can be detected early and may often be prevented. Depending on your health history and family history, you may need to have cancer screening at various ages. This may include screening for:  Colorectal cancer.  Prostate cancer.  Skin cancer.  Lung cancer. What should I  know about heart disease, diabetes, and high blood pressure? Blood pressure and heart disease  High blood pressure causes heart disease and increases the risk of stroke. This is more likely to develop in people who have high blood pressure readings, are of African descent, or are overweight.  Talk with your health care provider about your target blood pressure readings.  Have your blood pressure checked: ? Every 3-5 years if you are 27-25 years of age. ? Every year if you are 46 years old or older.  If you are between the ages of 60 and 44 and are a current or former smoker, ask your health care provider if you should have a one-time screening for abdominal aortic aneurysm (AAA). Diabetes Have regular diabetes screenings. This checks your fasting blood sugar level. Have the screening done:  Once every three years after age 47 if you are at a normal weight and have a low risk for diabetes.  More often and at a younger age if you are overweight or have a high risk for diabetes. What should I know about preventing infection? Hepatitis B If you have a higher risk for hepatitis B, you should be screened for this virus. Talk with your health care provider to find out if you are at risk for hepatitis B infection. Hepatitis C Blood testing is recommended for:  Everyone born from [birth year] through 1963.  Anyone with known risk factors for hepatitis C. Sexually transmitted infections (STIs)  You should be screened each year for STIs, including gonorrhea  and chlamydia, if: ? You are sexually active and are younger than 74 years of age. ? You are older than 73 years of age and your health care provider tells you that you are at risk for this type of infection. ? Your sexual activity has changed since you were last screened, and you are at increased risk for chlamydia or gonorrhea. Ask your health care provider if you are at risk.  Ask your health care provider about whether you are at high risk for HIV. Your health care provider may recommend a prescription medicine to help prevent HIV infection. If you choose to take medicine to prevent HIV, you should first get tested for HIV. You should then be tested every 3 months for as long as you are taking the medicine. Follow these instructions at home: Lifestyle  Do not use any products that contain nicotine or tobacco, such as cigarettes, e-cigarettes, and chewing tobacco. If you need help quitting, ask your health care provider.  Do not use street drugs.  Do not share needles.  Ask your health care provider for help if you need support or information about quitting drugs. Alcohol use  Do not drink alcohol if your health care provider tells you not to drink.  If you drink alcohol: ? Limit how much you have to 0-2 drinks a day. ? Be aware of how much alcohol is in your drink. In the [place] one drink equals one 12 oz bottle of beer (355 mL), one 5 oz glass of wine (148 mL), or one 1 oz glass of hard liquor (44 mL). General instructions  Schedule regular health, dental, and eye exams.  Stay current with your vaccines.  Tell your health care provider if: ? You often feel depressed. ? You have ever been abused or do not feel safe at home. Summary  Adopting a healthy lifestyle and getting preventive care are important in promoting health and wellness.  Follow your health care provider's  instructions about healthy diet, exercising, and getting tested or screened for diseases.  Follow your  health care provider's instructions on monitoring your cholesterol and blood pressure. This information is not intended to replace advice given to you by your health care provider. Make sure you discuss any questions you have with your health care provider. Document Revised: 10/15/2018 Document Reviewed: 10/15/2018 Elsevier Patient Education  2021 [employer].

## 2021-01-13 NOTE — Progress Notes (Signed)
Kevin Wood VISIT  01/13/2021  Subjective:  Kevin Wood is a 75 y.o. male patient of Kevin Nutting, DO who had a Medicare Annual Wellness Visit today. Kevin Wood is Retired and lives with their spouse. he has 1 children. he reports that he is socially active and does interact with friends/family regularly. he is moderately physically active and enjoys golf and flying airplanes but he has not been able to do that recently as he is the primary caregiver for his wife.  Patient Care Team: Kevin Nutting, DO as PCP - General (Family Medicine) Kevin Sam, MD as Referring Physician (Internal Medicine)  Advanced Directives 01/13/2021 09/02/2019 03/19/2019 01/07/2019 11/26/2017 11/14/2016  Does Patient Have a Medical Advance Directive? Yes Yes Yes Yes No No  Type of Advance Directive Living will;Healthcare Power of Kevin Wood;Living will Kevin Wood;Living will Kevin Wood;Living will - -  Does patient want to make changes to medical advance directive? No - Patient declined No - Patient declined - - - -  Copy of Kevin Wood in Chart? No - copy requested No - copy requested No - copy requested No - copy requested - -  Would patient like information on creating a medical advance directive? - - - - No - Patient declined No - Patient declined    Hospital Utilization Over the Past 12 Months: # of hospitalizations or ER visits: 1 # of surgeries: 0  Review of Systems    Patient reports that his overall health is worse when compared to last year.  Review of Systems: History obtained from chart review and the patient  All other systems negative.  Pain Assessment Pain : No/denies pain     Current Medications & Allergies (verified) Allergies as of 01/13/2021   No Known Allergies     Medication List       Accurate as of January 13, 2021  1:30 PM. If you have any questions, ask your nurse or doctor.         lisinopril-hydrochlorothiazide 20-12.5 MG tablet Commonly known as: ZESTORETIC Take 1 tablet by mouth daily.   metFORMIN 500 MG tablet Commonly known as: GLUCOPHAGE TAKE ONE TABLET BY MOUTH BEFORE BREAKFAST FOR DIABETES (ANNUAL KIDNEY FUNCTION TESTING IS NEEDED)  REMAIN WELL-HYDRATED ON THIS MEDICATION - OTHERWISE THERE IS A RISK OF  LACTIC ACIDOSIS (ACID-BASE DISTURBANCE) FOR DIABETES (ANNUAL KIDNEY FUNCTION TESTING IS NEEDED)  REMAIN WELL-HYDRATED ON THIS MEDICATION - OTHERWISE THERE IS A RISK OF  LACTIC ACIDOSIS (ACID-BASE DISTURBANCE)   simvastatin 40 MG tablet Commonly known as: ZOCOR TAKE 1 TABLET EVERY DAY       History (reviewed): Past Medical History:  Diagnosis Date  . Elevated blood pressure 12/12/2012  . H/O degenerative disc disease 12/17/2012  . History of nephrolithiasis 12/17/2012  . Hyperlipidemia 12/12/2012  . Lumbar vertebral fracture ([place]) [place]  . Type 2 diabetes mellitus ([place]) 12/17/2012  . Unspecified vitamin D deficiency 12/17/2012   Outside Records    Past Surgical History:  Procedure Laterality Date  . c spine fustion  1994  . colon cancer  father  . GALLBLADDER SURGERY  2012   Family History  Problem Relation Age of Onset  . Cancer Father        colon cancer   Social History   Socioeconomic History  . Marital status: Married    Spouse name: Kevin Wood  . Number of children: 1  . Years of education: 77  . Highest  education level: Associate degree: occupational, Hotel manager, or vocational program  Occupational History  . Occupation: Community education officer    Comment: retired  Tobacco Use  . Smoking status: Never Smoker  . Smokeless tobacco: Never Used  Vaping Use  . Vaping Use: Never used  Substance and Sexual Activity  . Alcohol use: Yes    Alcohol/week: 1.0 standard drink    Types: 1 Standard drinks or equivalent per week    Comment: occasionally  . Drug use: No  . Sexual activity: Yes  Other Topics Concern  . Not  on file  Social History Narrative   Walks dogs daily 2 times a day for 80 minutes total of walking. Primary caregiver for his wife.   Social Determinants of Health   Financial Resource Strain: Low Risk   . Difficulty of Paying Living Expenses: Not hard at all  Food Insecurity: No Food Insecurity  . Worried About Charity fundraiser in the Last Year: Never true  . Ran Out of Food in the Last Year: Never true  Transportation Needs: Unknown  . Lack of Transportation (Medical): No  . Lack of Transportation (Non-Medical): Not on file  Physical Activity: Sufficiently Active  . Days of Exercise per Week: 4 days  . Minutes of Exercise per Session: 60 min  Stress: No Stress Concern Present  . Feeling of Stress : Not at all  Social Connections: Moderately Integrated  . Frequency of Communication with Friends and Family: More than three times a week  . Frequency of Social Gatherings with Friends and Family: More than three times a week  . Attends Religious Services: More than 4 times per year  . Active Member of Clubs or Organizations: No  . Attends Archivist Meetings: Never  . Marital Status: Married    Activities of Daily Living In your present state of health, do you have any difficulty performing the following activities: 01/13/2021  Hearing? Y  Comment once in a while  Vision? N  Difficulty concentrating or making decisions? N  Walking or climbing stairs? N  Dressing or bathing? N  Doing errands, shopping? N  Preparing Food and eating ? N  Using the Toilet? N  In the past six months, have you accidently leaked urine? N  Do you have problems with loss of bowel control? N  Managing your Medications? N  Managing your Finances? N  Housekeeping or managing your Housekeeping? N  Some recent data might be hidden    Patient Education/Literacy How often do you need to have someone help you when you read instructions, pamphlets, or other written materials from your doctor or  pharmacy?: 1 - Never What is the last grade level you completed in school?: 14th  Exercise Current Exercise Habits: Home exercise routine, Type of exercise: walking, Time (Minutes): 60, Frequency (Times/Week): 4, Weekly Exercise (Minutes/Week): 240, Intensity: Moderate, Exercise limited by: None identified  Diet Patient reports consuming 3 meals a day and 1 snack(s) a day Patient reports that his primary diet is: Regular Patient reports that she does have regular access to food.   Depression Screen PHQ 2/9 Scores 01/13/2021 09/02/2019 07/17/2018 07/16/2017 07/12/2016 03/03/2015 01/25/2014  PHQ - 2 Score 0 0 0 0 0 0 0     Fall Risk Fall Risk  01/13/2021 09/02/2019 07/17/2018 07/16/2017 07/12/2016  Falls in the past year? 1 0 No No No  Number falls in past yr: 0 0 - - -  Injury with Fall? 1 0 - - -  Risk for fall due to : Other (Comment) - - - -  Risk for fall due to: Comment rolled over in the bed. - - - -  Follow up Falls evaluation completed;Education provided;Falls prevention discussed Falls prevention discussed - - -     Objective:   BP (!) 111/55 (BP Location: Right Arm, Patient Position: Sitting, Cuff Size: Normal)   Pulse 71   Temp 98.5 F (36.9 C) (Oral)   Resp 16   Ht 6' (1.829 m)   Wt 215 lb (97.5 kg)   SpO2 98%   BMI 29.16 kg/m   Last Weight  Most recent update: 01/13/2021  1:04 PM   Weight  97.5 kg (215 lb)            Body mass index is 29.16 kg/m.  Hearing/Vision  . Jerrick did not have difficulty with hearing/understanding during the face-to-face interview . Tajon did not have difficulty with his vision during the face-to-face interview . Reports that he has not had a formal eye exam by an eye care professional within the past year . Reports that he has not had a formal hearing evaluation within the past year  Cognitive Function: 6CIT Screen 01/13/2021 09/02/2019 07/24/2018  What Year? 0 points 0 points 0 points  What month? 0 points 0 points 0 points  What time?  0 points 0 points -  Count back from 20 0 points 0 points 0 points  Months in reverse 0 points 0 points 0 points  Repeat phrase 0 points 0 points 0 points  Total Score 0 0 -    Normal Cognitive Function Screening: Yes (Normal:0-7, Significant for Dysfunction: >8)  Immunization & Health Maintenance Record Immunization History  Administered Date(s) Administered  . Fluad Quad(high Dose 65+) 07/18/2020  . Influenza Split 08/05/2014, 08/05/2017  . Influenza, High Dose Seasonal PF 11/12/2012, 07/12/2016, 07/16/2017  . Influenza,inj,Quad PF,6+ Mos 07/02/2013  . Influenza-Unspecified 11/06/2007, 07/13/2008, 08/05/2009, 10/11/2011, 11/12/2012, 08/19/2013, 08/20/2015, 07/24/2018, 07/23/2019, 09/06/2019, 08/05/2020  . Moderna Sars-Covid-2 Vaccination 11/20/2019, 12/18/2019, 09/22/2020  . PPD Test 12/01/2014  . Pneumococcal Conjugate-13 04/13/2014, 03/03/2015  . Pneumococcal Polysaccharide-23 09/12/2015, 06/05/2016  . Pneumococcal-Unspecified 03/12/2011  . Td 10/22/2019  . Tdap 07/25/2009  . Zoster 08/24/2013  . Zoster Recombinat (Shingrix) 08/15/2017, 11/28/2017    Health Maintenance  Topic Date Due  . OPHTHALMOLOGY EXAM  01/13/2021 (Originally 10/19/2020)  . HEMOGLOBIN A1C  07/05/2021 (Originally 07/22/2020)  . COVID-19 Vaccine (4 - Booster for Moderna series) 03/22/2021  . FOOT EXAM  04/03/2021  . COLONOSCOPY (Pts 45-72yrs Insurance coverage will need to be confirmed)  12/17/2021  . TETANUS/TDAP  10/21/2029  . INFLUENZA VACCINE  Completed  . Hepatitis C Screening  Completed  . PNA vac Low Risk Adult  Completed  . HPV VACCINES  Aged Out       Assessment  This is a routine wellness examination for Kevin Wood.  Health Maintenance: Due or Overdue There are no preventive care reminders to display for this patient.  [name] does not need a referral for Community Assistance: Care Management:   no Social Work:    no Prescription Assistance:  no Nutrition/Diabetes  Education:  no   Plan:  Personalized Goals Goals Addressed              This Visit's Progress   .  Patient Stated (pt-stated)        01/13/2021 AWV Goal: Diabetes Management  . Patient will maintain an A1C level below 8.0 . Patient will not  develop any diabetic foot complications . Patient will not experience any hypoglycemic episodes over the next 3 months . Patient will notify our office of any CBG readings outside of the provider recommended range by calling [phone]. Marland Kitchen Patient will adhere to provider recommendations for diabetes management  Patient Self Management Activities . take all medications as prescribed and report any negative side effects . monitor and record blood sugar readings as directed . adhere to a low carbohydrate diet that incorporates lean proteins, vegetables, whole grains, low glycemic fruits . check feet daily noting any sores, cracks, injuries, or callous formations . see PCP or podiatrist if he notices any changes in his legs, feet, or toenails . Patient will visit PCP and have an A1C level checked every 3 to 6 months as directed  . have a yearly eye exam to monitor for vascular changes associated with diabetes and will request that the report be sent to his pcp.  . consult with his PCP regarding any changes in his health or new or worsening symptoms       Personalized Health Maintenance & Screening Recommendations  Foot exam, Hemoglobin A1C and Eye exam.  Lung Cancer Screening Recommended: no (Low Dose CT Chest recommended if Age 49-80 years, 30 pack-year currently smoking OR have quit w/in past 15 years) Hepatitis C Screening recommended: no HIV Screening recommended: no  Advanced Directives: Written information was not given per the patient's request.  Referrals & Orders No orders of the defined types were placed in this encounter.   Follow-up Plan . Follow-up with Kevin Nutting, DO as planned . Schedule your foot exam, eye exam and  hemoglobin A1C at the [place].  Marland Kitchen Medicare wellness exam in one year.   I have personally reviewed and noted the following in the patient's chart:   . Medical and social history . Use of alcohol, tobacco or illicit drugs  . Current medications and supplements . Functional ability and status . Nutritional status . Physical activity . Advanced directives . List of other physicians . Hospitalizations, surgeries, and ER visits in previous 12 months . Vitals . Screenings to include cognitive, depression, and falls . Referrals and appointments  In addition, I have reviewed and discussed with patient certain preventive protocols, quality metrics, and best practice recommendations. A written personalized care plan for preventive services as well as general preventive health recommendations were provided to patient.     Tinnie Gens, RN  01/13/2021

## 2021-04-05 ENCOUNTER — Other Ambulatory Visit: Payer: Self-pay

## 2021-04-05 ENCOUNTER — Ambulatory Visit (INDEPENDENT_AMBULATORY_CARE_PROVIDER_SITE_OTHER): Payer: Medicare HMO

## 2021-04-05 ENCOUNTER — Encounter: Payer: Self-pay | Admitting: Family Medicine

## 2021-04-05 ENCOUNTER — Ambulatory Visit (INDEPENDENT_AMBULATORY_CARE_PROVIDER_SITE_OTHER): Payer: Medicare HMO | Admitting: Family Medicine

## 2021-04-05 VITALS — BP 120/63 | HR 75 | Temp 98.0°F | Wt 211.0 lb

## 2021-04-05 DIAGNOSIS — M5412 Radiculopathy, cervical region: Secondary | ICD-10-CM

## 2021-04-05 DIAGNOSIS — M542 Cervicalgia: Secondary | ICD-10-CM

## 2021-04-05 DIAGNOSIS — E119 Type 2 diabetes mellitus without complications: Secondary | ICD-10-CM | POA: Diagnosis not present

## 2021-04-05 MED ORDER — PREDNISONE 50 MG PO TABS: ORAL_TABLET | ORAL | 0 refills | Status: DC

## 2021-04-05 NOTE — Assessment & Plan Note (Signed)
Updated xrays of cervical spine ordered.  Given handout for home exercises.  Adding course of prednisone 50mg  daily x5 days.  He may continue tylenol as needed.

## 2021-04-05 NOTE — Progress Notes (Signed)
Kevin Wood - 75 y.o. male MRN [id number]  Date of birth: 10/13/1946  Subjective Chief Complaint  Patient presents with  . Arm Pain  . Neck Pain    HPI Kevin Wood is a 75 y.o. male here today with complaint of neck and bilateral shoulder pain.   Symptoms started a couple of weeks ago.  Pain radiates from neck into shoulders and upper arms.  Has some weakness in the shoulders.  Denies injury or overuse.  He has history of cervical spondylosis and prior fusion at C3-4.  He has tried tylenol arthritis with some relief.    ROS:  A comprehensive ROS was completed and negative except as noted per HPI  No Known Allergies  Past Medical History:  Diagnosis Date  . Elevated blood pressure 12/12/2012  . H/O degenerative disc disease 12/17/2012  . History of nephrolithiasis 12/17/2012  . Hyperlipidemia 12/12/2012  . Lumbar vertebral fracture ([place]) [place]  . Type 2 diabetes mellitus ([place]) 12/17/2012  . Unspecified vitamin D deficiency 12/17/2012   Outside Records     Past Surgical History:  Procedure Laterality Date  . c spine fustion  1994  . colon cancer  father  . GALLBLADDER SURGERY  2012    Social History   Socioeconomic History  . Marital status: Married    Spouse name: [name]  . Number of children: 1  . Years of education: 49  . Highest education level: Associate degree: occupational, Hotel manager, or vocational program  Occupational History  . Occupation: Community education officer    Comment: retired  Tobacco Use  . Smoking status: Never Smoker  . Smokeless tobacco: Never Used  Vaping Use  . Vaping Use: Never used  Substance and Sexual Activity  . Alcohol use: Yes    Alcohol/week: 1.0 standard drink    Types: 1 Standard drinks or equivalent per week    Comment: occasionally  . Drug use: No  . Sexual activity: Yes  Other Topics Concern  . Not on file  Social History Narrative   Walks dogs daily 2 times a day for 80 minutes total of walking. Primary  caregiver for his wife.   Social Determinants of Health   Financial Resource Strain: Low Risk   . Difficulty of Paying Living Expenses: Not hard at all  Food Insecurity: No Food Insecurity  . Worried About Charity fundraiser in the Last Year: Never true  . Ran Out of Food in the Last Year: Never true  Transportation Needs: Unknown  . Lack of Transportation (Medical): No  . Lack of Transportation (Non-Medical): Not on file  Physical Activity: Sufficiently Active  . Days of Exercise per Week: 4 days  . Minutes of Exercise per Session: 60 min  Stress: No Stress Concern Present  . Feeling of Stress : Not at all  Social Connections: Moderately Integrated  . Frequency of Communication with Friends and Family: More than three times a week  . Frequency of Social Gatherings with Friends and Family: More than three times a week  . Attends Religious Services: More than 4 times per year  . Active Member of Clubs or Organizations: No  . Attends Archivist Meetings: Never  . Marital Status: Married    Family History  Problem Relation Age of Onset  . Cancer Father        colon cancer    Health Maintenance  Topic Date Due  . OPHTHALMOLOGY EXAM  10/19/2020  . FOOT EXAM  04/03/2021  .  HEMOGLOBIN A1C  07/05/2021 (Originally 07/22/2020)  . INFLUENZA VACCINE  06/05/2021  . COLONOSCOPY (Pts 45-81yrs Insurance coverage will need to be confirmed)  12/17/2021  . TETANUS/TDAP  10/21/2029  . COVID-19 Vaccine  Completed  . Hepatitis C Screening  Completed  . PNA vac Low Risk Adult  Completed  . Zoster Vaccines- Shingrix  Completed  . HPV VACCINES  Aged Out     ----------------------------------------------------------------------------------------------------------------------------------------------------------------------------------------------------------------- Physical Exam BP 120/63 (BP Location: Left Arm, Patient Position: Sitting, Cuff Size: Normal)   Pulse 75   Temp 98 F  (36.7 C)   Wt 211 lb (95.7 kg)   SpO2 98%   BMI 28.62 kg/m   Physical Exam Constitutional:      Appearance: Normal appearance.  HENT:     Head: Normocephalic and atraumatic.  Cardiovascular:     Rate and Rhythm: Normal rate and regular rhythm.  Pulmonary:     Effort: Pulmonary effort is normal.     Breath sounds: Normal breath sounds.  Musculoskeletal:     Cervical back: Neck supple.     Comments: TTP along bilateral trapezius and cervical paraspinals.  ROM is fairly good with some pain.  Rotator cuff strength is normal.   Neurological:     Mental Status: He is alert.  Psychiatric:        Mood and Affect: Mood normal.        Behavior: Behavior normal.     ------------------------------------------------------------------------------------------------------------------------------------------------------------------------------------------------------------------- Assessment and Plan  Cervical radiculopathy Updated xrays of cervical spine ordered.  Given handout for home exercises.  Adding course of prednisone 50mg  daily x5 days.  He may continue tylenol as needed.    Meds ordered this encounter  Medications  . predniSONE (DELTASONE) 50 MG tablet    Sig: Take 1 tab PO daily    Dispense:  5 tablet    Refill:  0    No follow-ups on file.    This visit occurred during the SARS-CoV-2 public health emergency.  Safety protocols were in place, including screening questions prior to the visit, additional usage of staff PPE, and extensive cleaning of exam room while observing appropriate contact time as indicated for disinfecting solutions.

## 2021-04-05 NOTE — Patient Instructions (Addendum)
Great to see you today! Have xrays completed.  Start prednisone daily.  Try home stretches.  Let me know if not improving with these.

## 2021-06-15 NOTE — Telephone Encounter (Signed)
Fax sent to [facility] requesting DM Eye Exam

## 2021-06-19 ENCOUNTER — Encounter: Payer: Self-pay | Admitting: Family Medicine

## 2021-06-24 ENCOUNTER — Other Ambulatory Visit: Payer: Self-pay | Admitting: Family Medicine

## 2021-08-23 DIAGNOSIS — K5900 Constipation, unspecified: Secondary | ICD-10-CM | POA: Insufficient documentation

## 2021-08-23 DIAGNOSIS — E786 Lipoprotein deficiency: Secondary | ICD-10-CM | POA: Insufficient documentation

## 2021-08-23 DIAGNOSIS — E669 Obesity, unspecified: Secondary | ICD-10-CM | POA: Insufficient documentation

## 2021-08-23 DIAGNOSIS — I6529 Occlusion and stenosis of unspecified carotid artery: Secondary | ICD-10-CM | POA: Insufficient documentation

## 2021-08-23 DIAGNOSIS — Z9049 Acquired absence of other specified parts of digestive tract: Secondary | ICD-10-CM | POA: Insufficient documentation

## 2021-08-23 DIAGNOSIS — D72819 Decreased white blood cell count, unspecified: Secondary | ICD-10-CM | POA: Insufficient documentation

## 2021-08-23 DIAGNOSIS — R7309 Other abnormal glucose: Secondary | ICD-10-CM | POA: Insufficient documentation

## 2021-08-23 DIAGNOSIS — M545 Low back pain, unspecified: Secondary | ICD-10-CM | POA: Insufficient documentation

## 2021-10-23 ENCOUNTER — Emergency Department
Admission: EM | Admit: 2021-10-23 | Discharge: 2021-10-23 | Disposition: A | Discharge status: Home / Self Care | Payer: Medicare HMO | Source: Home / Self Care

## 2021-10-23 ENCOUNTER — Other Ambulatory Visit: Payer: Self-pay

## 2021-10-23 DIAGNOSIS — U071 COVID-19: Secondary | ICD-10-CM

## 2021-10-23 LAB — POCT RAPID STREP A (OFFICE): Rapid Strep A Screen: NEGATIVE

## 2021-10-23 LAB — POC INFLUENZA A AND B ANTIGEN (URGENT CARE ONLY)
Influenza A Ag: NEGATIVE
Influenza B Ag: NEGATIVE

## 2021-10-23 LAB — POC SARS CORONAVIRUS 2 AG -  ED: SARS Coronavirus 2 Ag: POSITIVE — AB

## 2021-10-23 MED ORDER — BENZONATATE 100 MG PO CAPS: 100.0000 mg | ORAL_CAPSULE | Freq: Three times a day (TID) | ORAL | 0 refills | Status: DC

## 2021-10-23 MED ORDER — NIRMATRELVIR/RITONAVIR (PAXLOVID)TABLET: 3.0000 | ORAL_TABLET | Freq: Two times a day (BID) | ORAL | 0 refills | Status: AC

## 2021-10-23 NOTE — Discharge Instructions (Addendum)
Positive for COVID. Take Paxlovid as prescribed. Stop your simvastatin while taking the Paxlovid and for an additional 5 days after completing the Paxlovid.  Take Tessalon as prescribed as needed for cough. Continue with OTC medicine. Follow-up with PCP if no improvement in a week. Go to the ER if develop difficulty breathing.
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
## 2021-10-23 NOTE — ED Triage Notes (Signed)
Pt presents with cough, HA, sore throat, fever that began friday

## 2021-11-16 DIAGNOSIS — M5136 Other intervertebral disc degeneration, lumbar region: Secondary | ICD-10-CM | POA: Diagnosis not present

## 2021-11-16 DIAGNOSIS — M48061 Spinal stenosis, lumbar region without neurogenic claudication: Secondary | ICD-10-CM | POA: Diagnosis not present

## 2021-11-16 DIAGNOSIS — M5459 Other low back pain: Secondary | ICD-10-CM | POA: Diagnosis not present

## 2021-11-16 DIAGNOSIS — M47816 Spondylosis without myelopathy or radiculopathy, lumbar region: Secondary | ICD-10-CM | POA: Diagnosis not present

## 2021-11-16 DIAGNOSIS — G894 Chronic pain syndrome: Secondary | ICD-10-CM | POA: Diagnosis not present

## 2021-11-27 ENCOUNTER — Ambulatory Visit (INDEPENDENT_AMBULATORY_CARE_PROVIDER_SITE_OTHER): Payer: Medicare HMO | Admitting: Medical-Surgical

## 2021-11-27 ENCOUNTER — Other Ambulatory Visit: Payer: Self-pay

## 2021-11-27 ENCOUNTER — Encounter: Payer: Self-pay | Admitting: Medical-Surgical

## 2021-11-27 VITALS — BP 110/70 | HR 70 | Temp 97.9°F | Ht 72.0 in | Wt 209.9 lb

## 2021-11-27 DIAGNOSIS — M109 Gout, unspecified: Secondary | ICD-10-CM

## 2021-11-27 LAB — CBC WITH DIFFERENTIAL/PLATELET
Absolute Monocytes: 494 cells/uL (ref 200–950)
Basophils Absolute: 21 cells/uL (ref 0–200)
Basophils Relative: 0.4 %
Eosinophils Absolute: 62 cells/uL (ref 15–500)
Eosinophils Relative: 1.2 %
HCT: 45.3 % (ref 38.5–50.0)
Hemoglobin: 15.1 g/dL (ref 13.2–17.1)
Lymphs Abs: 998 cells/uL (ref 850–3900)
MCH: 28.9 pg (ref 27.0–33.0)
MCHC: 33.3 g/dL (ref 32.0–36.0)
MCV: 86.8 fL (ref 80.0–100.0)
MPV: 10.8 fL (ref 7.5–12.5)
Monocytes Relative: 9.5 %
Neutro Abs: 3624 cells/uL (ref 1500–7800)
Neutrophils Relative %: 69.7 %
Platelets: 179 10*3/uL (ref 140–400)
RBC: 5.22 10*6/uL (ref 4.20–5.80)
RDW: 14 % (ref 11.0–15.0)
Total Lymphocyte: 19.2 %
WBC: 5.2 10*3/uL (ref 3.8–10.8)

## 2021-11-27 LAB — URIC ACID: Uric Acid, Serum: 8.4 mg/dL — ABNORMAL HIGH (ref 4.0–8.0)

## 2021-11-27 MED ORDER — COLCHICINE 0.6 MG PO TABS: 0.6000 mg | ORAL_TABLET | Freq: Two times a day (BID) | ORAL | 2 refills | Status: DC

## 2021-11-27 NOTE — Patient Instructions (Signed)
Gout ?Gout is a condition that causes painful swelling of the joints. Gout is a type of inflammation of the joints (arthritis). This condition is caused by having too much uric acid in the body. Uric acid is a chemical that forms when the body breaks down substances called purines. Purines are important for building body proteins. ?When the body has too much uric acid, sharp crystals can form and build up inside the joints. This causes pain and swelling. Gout attacks can happen quickly and may be very painful (acute gout). Over time, the attacks can affect more joints and become more frequent (chronic gout). Gout can also cause uric acid to build up under the skin and inside the kidneys. ?What are the causes? ?This condition is caused by too much uric acid in your blood. This can happen because: ?Your kidneys do not remove enough uric acid from your blood. This is the most common cause. ?Your body makes too much uric acid. This can happen with some cancers and cancer treatments. It can also occur if your body is breaking down too many red blood cells (hemolytic anemia). ?You eat too many foods that are high in purines. These foods include organ meats and some seafood. Alcohol, especially beer, is also high in purines. ?A gout attack may be triggered by trauma or stress. ?What increases the risk? ?You are more likely to develop this condition if you: ?Have a family history of gout. ?Are male and middle-aged. ?Are male and have gone through menopause. ?Are obese. ?Frequently drink alcohol, especially beer. ?Are dehydrated. ?Lose weight too quickly. ?Have an organ transplant. ?Have lead poisoning. ?Take certain medicines, including aspirin, cyclosporine, diuretics, levodopa, and niacin. ?Have kidney disease. ?Have a skin condition called psoriasis. ?What are the signs or symptoms? ?An attack of acute gout happens quickly. It usually occurs in just one joint. The most common place is the big toe. Attacks often start  at night. Other joints that may be affected include joints of the feet, ankle, knee, fingers, wrist, or elbow. Symptoms of this condition may include: ?Severe pain. ?Warmth. ?Swelling. ?Stiffness. ?Tenderness. The affected joint may be very painful to touch. ?Shiny, red, or purple skin. ?Chills and fever. ?Chronic gout may cause symptoms more frequently. More joints may be involved. You may also have white or yellow lumps (tophi) on your hands or feet or in other areas near your joints. ?How is this diagnosed? ?This condition is diagnosed based on your symptoms, medical history, and physical exam. You may have tests, such as: ?Blood tests to measure uric acid levels. ?Removal of joint fluid with a thin needle (aspiration) to look for uric acid crystals. ?X-rays to look for joint damage. ?How is this treated? ?Treatment for this condition has two phases: treating an acute attack and preventing future attacks. Acute gout treatment may include medicines to reduce pain and swelling, including: ?NSAIDs. ?Steroids. These are strong anti-inflammatory medicines that can be taken by mouth (orally) or injected into a joint. ?Colchicine. This medicine relieves pain and swelling when it is taken soon after an attack. It can be given by mouth or through an IV. ?Preventive treatment may include: ?Daily use of smaller doses of NSAIDs or colchicine. ?Use of a medicine that reduces uric acid levels in your blood. ?Changes to your diet. You may need to see a dietitian about what to eat and drink to prevent gout. ?Follow these instructions at home: ?During a gout attack ? ?If directed, put ice on the affected area: ?  Put ice in a plastic bag. ?Place a towel between your skin and the bag. ?Leave the ice on for 20 minutes, 2-3 times a day. ?Raise (elevate) the affected joint above the level of your heart as often as possible. ?Rest the joint as much as possible. If the affected joint is in your leg, you may be given crutches to  use. ?Follow instructions from your health care provider about eating or drinking restrictions. ?Avoiding future gout attacks ?Follow a low-purine diet as told by your dietitian or health care provider. Avoid foods and drinks that are high in purines, including liver, kidney, anchovies, asparagus, herring, mushrooms, mussels, and beer. ?Maintain a healthy weight or lose weight if you are overweight. If you want to lose weight, talk with your health care provider. It is important that you do not lose weight too quickly. ?Start or maintain an exercise program as told by your health care provider. ?Eating and drinking ?Drink enough fluids to keep your urine pale yellow. ?If you drink alcohol: ?Limit how much you use to: ?0-1 drink a day for women. ?0-2 drinks a day for men. ?Be aware of how much alcohol is in your drink. In the [place] one drink equals one 12 oz bottle of beer (355 mL) one 5 oz glass of wine (148 mL), or one 1? oz glass of hard liquor (44 mL). ?General instructions ?Take over-the-counter and prescription medicines only as told by your health care provider. ?Do not drive or use heavy machinery while taking prescription pain medicine. ?Return to your normal activities as told by your health care provider. Ask your health care provider what activities are safe for you. ?Keep all follow-up visits as told by your health care provider. This is important. ?Contact a health care provider if you have: ?Another gout attack. ?Continuing symptoms of a gout attack after 10 days of treatment. ?Side effects from your medicines. ?Chills or a fever. ?Burning pain when you urinate. ?Pain in your lower back or belly. ?Get help right away if you: ?Have severe or uncontrolled pain. ?Cannot urinate. ?Summary ?Gout is painful swelling of the joints caused by inflammation. ?The most common site of pain is the big toe, but it can affect other joints in the body. ?Medicines and dietary changes can help to prevent and treat gout  attacks. ?This information is not intended to replace advice given to you by your health care provider. Make sure you discuss any questions you have with your health care provider. ?Document Revised: 05/02/2018 Document Reviewed: 05/14/2018 ?Elsevier Patient Education ? 2022 [employer] ? ?

## 2021-11-27 NOTE — Progress Notes (Signed)
°  HPI with pertinent ROS:   CC: Toe pain  HPI: Pleasant 76 year old male presenting today with complaints of 2 days of right great toe pain that started gradually and continued to worsen. Last night the pain was severe and he had difficulty sleeping. The toe is red and swollen, painful to touch. No personal history of gout but his mother had it. Has not been eating differently or drinking beer. Does drink Bourbon but only approximately 1 glass per week. He tried Aleve this morning with some benefit. Painful to wear a shoe. Limited ROM. Painful to walk. No fevers or chills.   I reviewed the past medical history, family history, social history, surgical history, and allergies today and no changes were needed.  Please see the problem list section below in epic for further details.   Physical exam:   Physical Exam Vitals and nursing note reviewed.  Constitutional:      General: He is not in acute distress.    Appearance: Normal appearance.  HENT:     Head: Normocephalic and atraumatic.  Cardiovascular:     Rate and Rhythm: Normal rate and regular rhythm.     Pulses: Normal pulses.     Heart sounds: Normal heart sounds. No murmur heard.   No friction rub. No gallop.  Pulmonary:     Effort: Pulmonary effort is normal. No respiratory distress.     Breath sounds: Normal breath sounds.  Musculoskeletal:     Right foot: Swelling (right great toe) and tenderness (right great toe) present.  Skin:    General: Skin is warm and dry.  Neurological:     Mental Status: He is alert and oriented to person, place, and time.  Psychiatric:        Mood and Affect: Mood normal.        Behavior: Behavior normal.        Thought Content: Thought content normal.        Judgment: Judgment normal.   Impression and Recommendations:    1. 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 Lower suspicion for septic arthritis but checking  labs. Starting with Colchicine 1.2mg  today with 0.6mg  1 hour later then 0.6mg  twice daily as needed. If no symptom improvement in 24-48 hours, advised patient to call back as we can consider a burst of Prednisone. Patient verbalized understanding and is agreeable to a plan.  - Uric acid - CBC with Differential - colchicine 0.6 MG tablet; Take 1 tablet (0.6 mg total) by mouth 2 (two) times daily.  Dispense: 60 tablet; Refill: 2  Return if symptoms worsen or fail to improve. ___________________________________________ Clearnce Sorrel, DNP, APRN, FNP-BC [facility]

## 2021-11-28 ENCOUNTER — Encounter: Payer: Self-pay | Admitting: Medical-Surgical

## 2021-11-28 MED ORDER — ALLOPURINOL 100 MG PO TABS: 100.0000 mg | ORAL_TABLET | Freq: Every day | ORAL | 6 refills | Status: DC

## 2021-11-28 NOTE — Addendum Note (Signed)
Addended bySamuel Bouche on: 11/28/2021 05:27 PM   Modules accepted: Orders

## 2022-01-10 ENCOUNTER — Other Ambulatory Visit: Payer: Self-pay

## 2022-01-10 ENCOUNTER — Ambulatory Visit (INDEPENDENT_AMBULATORY_CARE_PROVIDER_SITE_OTHER): Payer: Medicare HMO | Admitting: Family Medicine

## 2022-01-10 ENCOUNTER — Encounter: Payer: Self-pay | Admitting: Family Medicine

## 2022-01-10 VITALS — BP 109/70 | HR 74 | Temp 98.5°F | Ht 72.0 in | Wt 209.0 lb

## 2022-01-10 DIAGNOSIS — E119 Type 2 diabetes mellitus without complications: Secondary | ICD-10-CM | POA: Diagnosis not present

## 2022-01-10 DIAGNOSIS — K648 Other hemorrhoids: Secondary | ICD-10-CM

## 2022-01-10 DIAGNOSIS — M109 Gout, unspecified: Secondary | ICD-10-CM | POA: Diagnosis not present

## 2022-01-10 DIAGNOSIS — E790 Hyperuricemia without signs of inflammatory arthritis and tophaceous disease: Secondary | ICD-10-CM

## 2022-01-10 DIAGNOSIS — I10 Essential (primary) hypertension: Secondary | ICD-10-CM

## 2022-01-10 MED ORDER — LISINOPRIL-HYDROCHLOROTHIAZIDE 20-12.5 MG PO TABS: 1.0000 | ORAL_TABLET | Freq: Every day | ORAL | 1 refills | Status: DC

## 2022-01-10 MED ORDER — ATORVASTATIN CALCIUM 20 MG PO TABS: 20.0000 mg | ORAL_TABLET | Freq: Every day | ORAL | 1 refills | Status: DC

## 2022-01-10 MED ORDER — METFORMIN HCL 500 MG PO TABS: ORAL_TABLET | ORAL | 1 refills | Status: DC

## 2022-01-10 MED ORDER — POLYETHYLENE GLYCOL 3350 17 GM/SCOOP PO POWD: 17.0000 g | Freq: Two times a day (BID) | ORAL | 1 refills | Status: DC | PRN

## 2022-01-10 NOTE — Progress Notes (Signed)
?Kevin Wood - 76 y.o. male MRN [id number]  Date of birth: 04-20-1946 ? ?Subjective ?Chief Complaint  ?Patient presents with  ? Follow-up  ? Gout  ? Hemorrhoids  ? ? ?HPI ?Kevin Wood is a 77 year old male here today for follow-up of gout flare.  Seen by Samuel Bouche in our clinic back exam with complaint of great toe pain.  Treated with colchicine with resolution of symptoms.  Uric acid elevated at that time as well, allopurinol added.  He is tolerating this well so far.  He has not had any further symptoms since last visit. ? ?He has had issues with constipation and hemorrhoid irritation.  He has had the Dulcolax for constipation.  He has not tried anything other than this. ? ?ROS:  A comprehensive ROS was completed and negative except as noted per HPI ? ?No Known Allergies ? ?Past Medical History:  ?Diagnosis Date  ? Elevated blood pressure 12/12/2012  ? H/O degenerative disc disease 12/17/2012  ? History of nephrolithiasis 12/17/2012  ? Hyperlipidemia 12/12/2012  ? Lumbar vertebral fracture ([place]) [facility]  ? Type 2 diabetes mellitus ([place]) 12/17/2012  ? Unspecified vitamin D deficiency 12/17/2012  ? Outside Records   ? ? ?Past Surgical History:  ?Procedure Laterality Date  ? c spine fustion  1994  ? colon cancer  father  ? GALLBLADDER SURGERY  2012  ? ? ?Social History  ? ?Socioeconomic History  ? Marital status: Married  ?  Spouse name: [name]  ? Number of children: 1  ? Years of education: 66  ? Highest education level: Associate degree: occupational, Hotel manager, or vocational program  ?Occupational History  ? Occupation: Community education officer  ?  Comment: retired  ?Tobacco Use  ? Smoking status: Never  ? Smokeless tobacco: Never  ?Vaping Use  ? Vaping Use: Never used  ?Substance and Sexual Activity  ? Alcohol use: Yes  ?  Alcohol/week: 1.0 standard drink  ?  Types: 1 Standard drinks or equivalent per week  ?  Comment: occasionally  ? Drug use: No  ? Sexual activity: Yes  ?Other Topics Concern  ? Not on file   ?Social History Narrative  ? Walks dogs daily 2 times a day for 80 minutes total of walking. Primary caregiver for his wife.  ? ?Social Determinants of Health  ? ?Financial Resource Strain: Low Risk   ? Difficulty of Paying Living Expenses: Not hard at all  ?Food Insecurity: No Food Insecurity  ? Worried About Charity fundraiser in the Last Year: Never true  ? Ran Out of Food in the Last Year: Never true  ?Transportation Needs: Unknown  ? Lack of Transportation (Medical): No  ? Lack of Transportation (Non-Medical): Not on file  ?Physical Activity: Sufficiently Active  ? Days of Exercise per Week: 4 days  ? Minutes of Exercise per Session: 60 min  ?Stress: No Stress Concern Present  ? Feeling of Stress : Not at all  ?Social Connections: Moderately Integrated  ? Frequency of Communication with Friends and Family: More than three times a week  ? Frequency of Social Gatherings with Friends and Family: More than three times a week  ? Attends Religious Services: More than 4 times per year  ? Active Member of Clubs or Organizations: No  ? Attends Archivist Meetings: Never  ? Marital Status: Married  ? ? ?Family History  ?Problem Relation Age of Onset  ? Cancer Father   ?     colon cancer  ? ? ?  Health Maintenance  ?Topic Date Due  ? HEMOGLOBIN A1C  07/22/2020  ? FOOT EXAM  04/03/2021  ? OPHTHALMOLOGY EXAM  09/01/2021  ? COLONOSCOPY (Pts 45-72yr Insurance coverage will need to be confirmed)  12/17/2021  ? TETANUS/TDAP  10/21/2029  ? Pneumonia Vaccine 76 Years old  Completed  ? INFLUENZA VACCINE  Completed  ? COVID-19 Vaccine  Completed  ? Hepatitis C Screening  Completed  ? Zoster Vaccines- Shingrix  Completed  ? HPV VACCINES  Aged Out  ? ? ? ?----------------------------------------------------------------------------------------------------------------------------------------------------------------------------------------------------------------- ?Physical Exam ?BP 109/70 (BP Location: Left Arm, Patient  Position: Sitting, Cuff Size: Large)   Pulse 74   Temp 98.5 ?F (36.9 ?C)   Ht 6' (1.829 m)   Wt 209 lb (94.8 kg)   SpO2 98%   BMI 28.35 kg/m?  ? ?Physical Exam ?Constitutional:   ?   Appearance: Normal appearance.  ?Eyes:  ?   General: No scleral icterus. ?Cardiovascular:  ?   Rate and Rhythm: Normal rate and regular rhythm.  ?Pulmonary:  ?   Effort: Pulmonary effort is normal.  ?   Breath sounds: Normal breath sounds.  ?Musculoskeletal:  ?   Cervical back: Neck supple.  ?Neurological:  ?   Mental Status: He is alert.  ?Psychiatric:     ?   Mood and Affect: Mood normal.     ?   Behavior: Behavior normal.  ? ? ?------------------------------------------------------------------------------------------------------------------------------------------------------------------------------------------------------------------- ?Assessment and Plan ? ?Internal hemorrhoids ?Does not want to undergo any interventional procedures at this time.  We discussed increasing fiber intake and/or adding MiraLAX to help with constipation.  He would like to try MiraLAX.  Prescription sent in.  Increase fluids recommended as well. ? ?Controlled type 2 diabetes mellitus without complication, without long-term current use of insulin ([place] ?[place] [facility] [place] [facility]  He will bring in a copy of these.  Metformin renewed. ? ?Hyperuricemia ?Hyperuricemia with recent gout flare.  Doing well with allopurinol at this time.  Update uric acid level ordered. ? ? ?Meds ordered this encounter  ?Medications  ? atorvastatin (LIPITOR) 20 MG tablet  ?  Sig: Take 1 tablet (20 mg total) by mouth daily.  ?  Dispense:  90 tablet  ?  Refill:  1  ? lisinopril-hydrochlorothiazide (ZESTORETIC) 20-12.5 MG tablet  ?  Sig: Take 1 tablet by mouth daily.  ?  Dispense:  90 tablet  ?  Refill:  1  ? metFORMIN (GLUCOPHAGE) 500 MG tablet  ?  Sig: TAKE ONE TABLET BY MOUTH BEFORE BREAKFAST FOR DIABETES (ANNUAL KIDNEY FUNCTION  TESTING IS NEEDED)  REMAIN WELL-HYDRATED ON THIS MEDICATION - OTHERWISE THERE IS A RISK OF  LACTIC ACIDOSIS (ACID-BASE DISTURBANCE) FOR DIABETES (ANNUAL KIDNEY FUNCTION TESTING IS NEEDED)  REMAIN WELL-HYDRATED ON THIS MEDICATION - OTHERWISE THERE IS A RISK OF  LACTIC ACIDOSIS (ACID-BASE DISTURBANCE)  ?  Dispense:  90 tablet  ?  Refill:  1  ? polyethylene glycol powder (GLYCOLAX/MIRALAX) 17 GM/SCOOP powder  ?  Sig: Take 17 g by mouth 2 (two) times daily as needed.  ?  Dispense:  3350 g  ?  Refill:  1  ? ? ?Return in about 6 months (around 07/13/2022) for T2DM/HTN. ? ? ? ?This visit occurred during the SARS-CoV-2 public health emergency.  Safety protocols were in place, including screening questions prior to the visit, additional usage of staff PPE, and extensive cleaning of exam room while observing appropriate contact time as indicated for disinfecting solutions.  ? ?

## 2022-01-10 NOTE — Assessment & Plan Note (Signed)
Hyperuricemia with recent gout flare.  Doing well with allopurinol at this time.  Update uric acid level ordered. ?

## 2022-01-10 NOTE — Assessment & Plan Note (Signed)
Reports having labs completed through the [place] approximately 6 months ago.  He will bring in a copy of these.  Metformin renewed. ?

## 2022-01-10 NOTE — Assessment & Plan Note (Signed)
Does not want to undergo any interventional procedures at this time.  We discussed increasing fiber intake and/or adding MiraLAX to help with constipation.  He would like to try MiraLAX.  Prescription sent in.  Increase fluids recommended as well. ?

## 2022-01-10 NOTE — Patient Instructions (Addendum)
Have labs completed to check uric acid in about 1 month.  ?Try adding miralax 1-2x daily.  Drink plenty of fluid with this.  ?

## 2022-02-05 LAB — BASIC METABOLIC PANEL
BUN: 18 (ref 4–21)
CO2: 28 — AB (ref 13–22)
Chloride: 105 (ref 99–108)
Creatinine: 1 (ref 0.6–1.3)
Glucose: 130
Potassium: 3.4 mEq/L — AB (ref 3.5–5.1)
Sodium: 137 (ref 137–147)

## 2022-02-05 LAB — LIPID PANEL
Cholesterol: 119 (ref 0–200)
HDL: 38 (ref 35–70)
LDL Cholesterol: 64
Triglycerides: 87 (ref 40–160)

## 2022-02-05 LAB — HEPATIC FUNCTION PANEL
ALT: 44 U/L — AB (ref 10–40)
AST: 34 (ref 14–40)
Alkaline Phosphatase: 79 (ref 25–125)
Bilirubin, Total: 0.5

## 2022-02-05 LAB — COMPREHENSIVE METABOLIC PANEL
Albumin: 3.5 (ref 3.5–5.0)
Globulin: 105

## 2022-02-12 ENCOUNTER — Telehealth: Payer: Self-pay | Admitting: Family Medicine

## 2022-02-12 NOTE — Telephone Encounter (Signed)
Patient dropped off medical records from the [place] for provider. Left in provider box. AM ?

## 2022-02-22 ENCOUNTER — Encounter: Payer: Self-pay | Admitting: Family Medicine

## 2022-05-09 DIAGNOSIS — M48061 Spinal stenosis, lumbar region without neurogenic claudication: Secondary | ICD-10-CM | POA: Diagnosis not present

## 2022-05-09 DIAGNOSIS — G894 Chronic pain syndrome: Secondary | ICD-10-CM | POA: Diagnosis not present

## 2022-05-09 DIAGNOSIS — M47816 Spondylosis without myelopathy or radiculopathy, lumbar region: Secondary | ICD-10-CM | POA: Diagnosis not present

## 2022-05-09 DIAGNOSIS — M5459 Other low back pain: Secondary | ICD-10-CM | POA: Diagnosis not present

## 2022-05-09 DIAGNOSIS — Z79891 Long term (current) use of opiate analgesic: Secondary | ICD-10-CM | POA: Diagnosis not present

## 2022-05-09 DIAGNOSIS — Z5181 Encounter for therapeutic drug level monitoring: Secondary | ICD-10-CM | POA: Diagnosis not present

## 2022-05-09 DIAGNOSIS — M5136 Other intervertebral disc degeneration, lumbar region: Secondary | ICD-10-CM | POA: Diagnosis not present

## 2022-05-22 ENCOUNTER — Ambulatory Visit (INDEPENDENT_AMBULATORY_CARE_PROVIDER_SITE_OTHER): Payer: Medicare HMO | Admitting: Family Medicine

## 2022-05-22 DIAGNOSIS — Z0000 Encounter for general adult medical examination without abnormal findings: Secondary | ICD-10-CM | POA: Diagnosis not present

## 2022-05-22 NOTE — Patient Instructions (Addendum)
[facility] Maintenance Summary and Written Plan of Care  [name] ,  Thank you for allowing me to perform your Medicare Annual Wellness Visit and for your ongoing commitment to your health.   Health Maintenance & Immunization History Health Maintenance  Topic Date Due  . OPHTHALMOLOGY EXAM  05/22/2022 (Originally 09/01/2021)  . Diabetic kidney evaluation - Urine ACR  05/23/2022 (Originally 01/13/2015)  . FOOT EXAM  05/23/2022 (Originally 04/03/2021)  . HEMOGLOBIN A1C  06/22/2022 (Originally 07/22/2020)  . COLONOSCOPY (Pts 45-50yr Insurance coverage will need to be confirmed)  05/23/2023 (Originally 12/17/2021)  . INFLUENZA VACCINE  06/05/2022  . Diabetic kidney evaluation - GFR measurement  02/06/2023  . TETANUS/TDAP  10/21/2029  . Pneumonia Vaccine 76 Years old  Completed  . COVID-19 Vaccine  Completed  . Hepatitis C Screening  Completed  . Zoster Vaccines- Shingrix  Completed  . HPV VACCINES  Aged Out   Immunization History  Administered Date(s) Administered  . Fluad Quad(high Dose 65+) 07/18/2020, 07/31/2021  . Influenza Split 08/05/2014, 08/05/2017  . Influenza, High Dose Seasonal PF 11/12/2012, 07/12/2016, 07/16/2017  . Influenza,inj,Quad PF,6+ Mos 07/02/2013  . Influenza-Unspecified 11/06/2007, 07/13/2008, 08/05/2009, 10/11/2011, 11/12/2012, 08/19/2013, 08/20/2015, 07/24/2018, 07/23/2019, 09/06/2019, 08/05/2020  . Moderna Covid-19 Vaccine Bivalent Booster 13yr& up 10/19/2021  . Moderna Sars-Covid-2 Vaccination 11/20/2019, 12/18/2019, 09/22/2020, 02/20/2021  . PPD Test 12/01/2014  . Pneumococcal Conjugate-13 04/13/2014, 03/03/2015  . Pneumococcal Polysaccharide-23 09/12/2015, 06/05/2016  . Pneumococcal-Unspecified 03/12/2011  . Td 10/22/2019  . Tdap 07/25/2009  . Zoster Recombinat (Shingrix) 08/15/2017, 11/28/2017  . Zoster, Live 08/24/2013    These are the patient goals that we discussed:  Goals Addressed              This  Visit's Progress   .  Patient Stated (pt-stated)        Continue to stay healthy.         This is a list of Health Maintenance Items that are overdue or due now: Eye exam Urine ACR Foot Exam Hemoglobin A1C Colorectal Cancer screening- scheduled for 10/22/22  Patient stated that he has had his eye exam, urine ACR, foot exam [facility] and he will bring his records next time he comes to the office.  Orders/Referrals Placed Today: No orders of the defined types were placed in this encounter.  (Contact our referral department at [phone] you have not spoken with someone about your referral appointment within the next 5 days)    Follow-up Plan [facility]-up with [facility] as planned Please bring your records for eye exam, urine ACR, foot exam and hemoglobin A1C from the [facility] wellness visit in one year. Patient will access AVS on mychart.      Health Maintenance, Male Adopting a healthy lifestyle and getting preventive care are important in promoting health and wellness. Ask your health care provider about: The right schedule for you to have regular tests and exams. Things you can do on your own to prevent diseases and keep yourself healthy. What should I know about diet, weight, and exercise? Eat a healthy diet  Eat a diet that includes plenty of vegetables, fruits, low-fat dairy products, and lean protein. Do not eat a lot of foods that are high in solid fats, added sugars, or sodium. Maintain a healthy weight Body mass index (BMI) is a measurement that can be used to identify possible weight problems. It estimates body fat based on height and weight. Your health  care provider can help determine your BMI and help you achieve or maintain a healthy weight. Get regular exercise Get regular exercise. This is one of the most important things you can do for your health. Most adults should: Exercise for at least 150 minutes each week. The  exercise should increase your heart rate and make you sweat (moderate-intensity exercise). Do strengthening exercises at least twice a week. This is in addition to the moderate-intensity exercise. Spend less time sitting. Even light physical activity can be beneficial. Watch cholesterol and blood lipids Have your blood tested for lipids and cholesterol at 74 years of age, then have this test every 5 years. You may need to have your cholesterol levels checked more often if: Your lipid or cholesterol levels are high. You are older than 74 years of age. You are at high risk for heart disease. What should I know about cancer screening? Many types of cancers can be detected early and may often be prevented. Depending on your health history and family history, you may need to have cancer screening at various ages. This may include screening for: Colorectal cancer. Prostate cancer. Skin cancer. Lung cancer. What should I know about heart disease, diabetes, and high blood pressure? Blood pressure and heart disease High blood pressure causes heart disease and increases the risk of stroke. This is more likely to develop in people who have high blood pressure readings or are overweight. Talk with your health care provider about your target blood pressure readings. Have your blood pressure checked: Every 3-5 years if you are 37-9 years of age. Every year if you are 58 years old or older. If you are between the ages of 32 and 58 and are a current or former smoker, ask your health care provider if you should have a one-time screening for abdominal aortic aneurysm (AAA). Diabetes Have regular diabetes screenings. This checks your fasting blood sugar level. Have the screening done: Once every three years after age 12 if you are at a normal weight and have a low risk for diabetes. More often and at a younger age if you are overweight or have a high risk for diabetes. What should I know about preventing  infection? Hepatitis B If you have a higher risk for hepatitis B, you should be screened for this virus. Talk with your health care provider to find out if you are at risk for hepatitis B infection. Hepatitis C Blood testing is recommended for: Everyone born from [birth year] through 1963. Anyone with known risk factors for hepatitis C. Sexually transmitted infections (STIs) You should be screened each year for STIs, including gonorrhea and chlamydia, if: You are sexually active and are younger than 74 years of age. You are older than 74 years of age and your health care provider tells you that you are at risk for this type of infection. Your sexual activity has changed since you were last screened, and you are at increased risk for chlamydia or gonorrhea. Ask your health care provider if you are at risk. Ask your health care provider about whether you are at high risk for HIV. Your health care provider may recommend a prescription medicine to help prevent HIV infection. If you choose to take medicine to prevent HIV, you should first get tested for HIV. You should then be tested every 3 months for as long as you are taking the medicine. Follow these instructions at home: Alcohol use Do not drink alcohol if your health care provider tells you not  to drink. If you drink alcohol: Limit how much you have to 0-2 drinks a day. Know how much alcohol is in your drink. In the [place] one drink equals one 12 oz bottle of beer (355 mL), one 5 oz glass of wine (148 mL), or one 1 oz glass of hard liquor (44 mL). Lifestyle Do not use any products that contain nicotine or tobacco. These products include cigarettes, chewing tobacco, and vaping devices, such as e-cigarettes. If you need help quitting, ask your health care provider. Do not use street drugs. Do not share needles. Ask your health care provider for help if you need support or information about quitting drugs. General instructions Schedule regular health,  dental, and eye exams. Stay current with your vaccines. Tell your health care provider if: You often feel depressed. You have ever been abused or do not feel safe at home. Summary Adopting a healthy lifestyle and getting preventive care are important in promoting health and wellness. Follow your health care provider's instructions about healthy diet, exercising, and getting tested or screened for diseases. Follow your health care provider's instructions on monitoring your cholesterol and blood pressure. This information is not intended to replace advice given to you by your health care provider. Make sure you discuss any questions you have with your health care provider. Document Revised: 03/13/2021 Document Reviewed: 03/13/2021 Elsevier Patient Education  [place].
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
## 2022-05-25 ENCOUNTER — Other Ambulatory Visit: Payer: Self-pay

## 2022-05-25 MED ORDER — ALLOPURINOL 100 MG PO TABS: 100.0000 mg | ORAL_TABLET | Freq: Every day | ORAL | 1 refills | Status: AC

## 2022-06-20 ENCOUNTER — Ambulatory Visit (INDEPENDENT_AMBULATORY_CARE_PROVIDER_SITE_OTHER): Payer: Medicare HMO

## 2022-06-20 ENCOUNTER — Encounter: Payer: Self-pay | Admitting: Sports Medicine

## 2022-06-20 ENCOUNTER — Ambulatory Visit (INDEPENDENT_AMBULATORY_CARE_PROVIDER_SITE_OTHER): Payer: Medicare HMO | Admitting: Sports Medicine

## 2022-06-20 DIAGNOSIS — R051 Acute cough: Secondary | ICD-10-CM

## 2022-06-20 DIAGNOSIS — R059 Cough, unspecified: Secondary | ICD-10-CM | POA: Diagnosis not present

## 2022-06-20 MED ORDER — AZITHROMYCIN 250 MG PO TABS
ORAL_TABLET | ORAL | 0 refills | Status: DC
Start: 2022-06-20 — End: 2022-07-16

## 2022-06-20 MED ORDER — PREDNISONE 50 MG PO TABS: 50.0000 mg | ORAL_TABLET | Freq: Every day | ORAL | 0 refills | Status: DC

## 2022-06-20 NOTE — Progress Notes (Signed)
    Procedures performed today:    None.  Independent interpretation of notes and tests performed by another provider:   None.  Brief History, Exam, Impression, and Recommendations:    Cough This is a very pleasant 76 year old male, he has had a 2 and half day history of cough, fevers, chills, muscle aches, body aches, sore throat. He did a couple COVID test at home that were both negative. He is fully vaccinated for COVID, pneumonia, his flu shot is up-to-date but of course from last fall. On exam he is speaking full sentences, oropharyngeal exam unrevealing, he does have some coarse sounds left lower lobe. Flu test is negative today. We will get a chest x-ray, considering his high risk with diabetes, we will also add a course of steroids and azithromycin. Follow-up with [place] if not feeling significantly better by next week.    ____________________________________________ Gwen Her. Dianah Field, M.D., ABFM., CAQSM., AME. Primary Care and Sports Medicine [facility]  Adjunct Professor of [place] [employer] [facility] of Medicine  Risk manager

## 2022-06-20 NOTE — Assessment & Plan Note (Addendum)
This is a very pleasant 76 year old male, he has had a 2 and half day history of cough, fevers, chills, muscle aches, body aches, sore throat. He did a couple COVID test at home that were both negative. He is fully vaccinated for COVID, pneumonia, his flu shot is up-to-date but of course from last fall. On exam he is speaking full sentences, oropharyngeal exam unrevealing, he does have some coarse sounds left lower lobe. Flu test is negative today. We will get a chest x-ray, considering his high risk with diabetes, we will also add a course of steroids and azithromycin. Follow-up with [place] if not feeling significantly better by next week.

## 2022-06-24 ENCOUNTER — Other Ambulatory Visit: Payer: Self-pay | Admitting: Family Medicine

## 2022-06-24 ENCOUNTER — Encounter: Payer: Self-pay | Admitting: Sports Medicine

## 2022-07-10 ENCOUNTER — Other Ambulatory Visit: Payer: Self-pay | Admitting: Family Medicine

## 2022-07-16 ENCOUNTER — Encounter: Payer: Self-pay | Admitting: Family Medicine

## 2022-07-16 ENCOUNTER — Ambulatory Visit (INDEPENDENT_AMBULATORY_CARE_PROVIDER_SITE_OTHER): Payer: Medicare HMO | Admitting: Family Medicine

## 2022-07-16 VITALS — BP 125/66 | HR 87 | Ht 72.0 in | Wt 211.0 lb

## 2022-07-16 DIAGNOSIS — R809 Proteinuria, unspecified: Secondary | ICD-10-CM | POA: Diagnosis not present

## 2022-07-16 DIAGNOSIS — I10 Essential (primary) hypertension: Secondary | ICD-10-CM | POA: Diagnosis not present

## 2022-07-16 DIAGNOSIS — E1129 Type 2 diabetes mellitus with other diabetic kidney complication: Secondary | ICD-10-CM | POA: Diagnosis not present

## 2022-07-16 DIAGNOSIS — E782 Mixed hyperlipidemia: Secondary | ICD-10-CM

## 2022-07-16 DIAGNOSIS — Z23 Encounter for immunization: Secondary | ICD-10-CM

## 2022-07-16 LAB — POCT UA - MICROALBUMIN
Creatinine, POC: 300 mg/dL
Microalbumin Ur, POC: 80 mg/L

## 2022-07-16 LAB — POCT GLYCOSYLATED HEMOGLOBIN (HGB A1C): HbA1c, POC (controlled diabetic range): 6.9 % (ref 0.0–7.0)

## 2022-07-16 MED ORDER — LISINOPRIL-HYDROCHLOROTHIAZIDE 20-12.5 MG PO TABS: 1.0000 | ORAL_TABLET | Freq: Every day | ORAL | 1 refills | Status: AC
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
## 2022-07-16 NOTE — Patient Instructions (Signed)
Continue current medications.  See me again in 6 months.

## 2022-07-16 NOTE — Assessment & Plan Note (Signed)
Lab Results  Component Value Date   LDLCALC 64 02/05/2022  Continue atorvastatin at current strength.

## 2022-07-16 NOTE — Assessment & Plan Note (Signed)
BP remains well controlled with lisinopril/hctz.  Continue at current strength.

## 2022-07-16 NOTE — Assessment & Plan Note (Addendum)
A1c today is 6.9%.  Doing well with metformin, will continue. Elevated microalbumin.  Continue Ace-I.  F/u in 6 months.

## 2022-08-28 ENCOUNTER — Ambulatory Visit (INDEPENDENT_AMBULATORY_CARE_PROVIDER_SITE_OTHER): Payer: Medicare HMO

## 2022-08-28 ENCOUNTER — Ambulatory Visit (INDEPENDENT_AMBULATORY_CARE_PROVIDER_SITE_OTHER): Payer: Medicare HMO | Admitting: Sports Medicine

## 2022-08-28 DIAGNOSIS — M1712 Unilateral primary osteoarthritis, left knee: Secondary | ICD-10-CM

## 2022-08-28 DIAGNOSIS — M170 Bilateral primary osteoarthritis of knee: Secondary | ICD-10-CM | POA: Diagnosis not present

## 2022-08-28 DIAGNOSIS — Z09 Encounter for follow-up examination after completed treatment for conditions other than malignant neoplasm: Secondary | ICD-10-CM

## 2022-08-28 MED ORDER — ACETAMINOPHEN ER 650 MG PO TBCR: 650.0000 mg | EXTENDED_RELEASE_TABLET | Freq: Three times a day (TID) | ORAL | 3 refills | Status: AC | PRN

## 2022-08-28 NOTE — Assessment & Plan Note (Signed)
This is a very pleasant 76 year old male with history of gout, he had a several week history of increasing pain left knee medial joint line with swelling, moderate gelling. He took some Tylenol and symptoms are starting to improve. He is very happy with where things are now. On exam he does have tenderness medial joint line and effusion, otherwise exam is benign. He will continue arthritis strength Tylenol, I would like updated baseline x-rays, and I will add home physical therapy. He does have the option to add an NSAID if need be. I do not think this was a gout flare but he understands if he has rapid onset swelling, warmth and extreme pain in his left knee is likely a gout flare and we would treat it with an aspiration and injection.

## 2022-08-28 NOTE — Progress Notes (Signed)
    Procedures performed today:    None.  Independent interpretation of notes and tests performed by another provider:   None.  Brief History, Exam, Impression, and Recommendations:    Primary osteoarthritis of left knee This is a very pleasant 76 year old male with history of gout, he had a several week history of increasing pain left knee medial joint line with swelling, moderate gelling. He took some Tylenol and symptoms are starting to improve. He is very happy with where things are now. On exam he does have tenderness medial joint line and effusion, otherwise exam is benign. He will continue arthritis strength Tylenol, I would like updated baseline x-rays, and I will add home physical therapy. He does have the option to add an NSAID if need be. I do not think this was a gout flare but he understands if he has rapid onset swelling, warmth and extreme pain in his left knee is likely a gout flare and we would treat it with an aspiration and injection.    ____________________________________________ Gwen Her. Dianah Field, M.D., ABFM., CAQSM., AME. Primary Care and Sports Medicine [facility]  Adjunct Professor of [place] [employer] [facility] of Medicine  Risk manager

## 2022-10-10 LAB — HEMOGLOBIN A1C: Hemoglobin A1C: 6.6

## 2022-10-22 LAB — HM COLONOSCOPY

## 2022-10-31 ENCOUNTER — Telehealth: Payer: Self-pay

## 2022-10-31 DIAGNOSIS — M1712 Unilateral primary osteoarthritis, left knee: Secondary | ICD-10-CM

## 2022-10-31 NOTE — Telephone Encounter (Signed)
Pt lvm requesting MRI results next steps for treatment. Pt has not seen Ortho for injury. Message has been forwarded to Dr. Darene Lamer (Ortho) since Dr. Zigmund Daniel is unavailable.      [facility] Outside Information   MRI Knee Left  WO IV Contrast  Anatomical Region Laterality Modality  Thigh -- Magnetic Resonance  Knee -- --  Leg -- --   Impression  IMPRESSION: 1.  Complex tear of the medial meniscus. 2.  Mild acute on chronic sprain of the MCL. 3.  Tricompartmental degenerative changes with focal osteochondral abnormality in the lateral trochlea. 4.  Mild patellar tendinosis. 5.  Small knee joint effusion with synovitis.  Electronically Signed by: Salvadore Oxford, MD on 10/31/2022 9:25 AM Narrative  This result has an attachment that is not available. TECHNIQUE:MRI KNEE LEFT WO IV CONTRAST-- Multiplanar, multisequence MR imaging of the knee was performed without contrast.  INDICATION: Left medial knee pain.  Number pain. Medial knee pain.  COMPARISON:  None.    FINDINGS:  Bones: No acute fracture, avascular necrosis, or marrow replacement. Patellar enthesopathic changes.  Anterior cruciate ligament: Intact.  Posterior cruciate ligament: Intact.  Medial meniscus: Complex tear of the body. Horizontal tear of the posterior horn extending to the inferior articular surface.  Lateral meniscus: Intact.  Medial collateral ligament: Edema superficial and deep to the MCL. Thickening of the proximal MCL. Irregularity of the deep fibers.  Lateral collateral ligamentous structures (including fibular collateral ligament, biceps femoris tendon, popliteus tendon, and posterolateral corner structures): Intact.  Cartilage: - Medial compartment: Partial-thickness fissuring of the proximal third weightbearing medial femoral condyle. Partial-thickness fissuring of the notch third medial tibial plateau. Reactive edema in the peripheral medial tibial plateau. - Lateral compartment: Low-grade  chondral changes in the lateral tibial plateau. - Patellofemoral compartment: Full-thickness fissuring and osteochondral abnormality in the lateral trochlea measuring 6 x 10 mm. Mild cartilage thinning of the median ridge of the patella.  Extensor mechanism, including quadriceps tendon, medial and lateral patellar retinaculum, and patellar tendon: Mild patellar tendinosis.  IT band/pes anserine tendons: Intact.  Joint: Small effusion. Synovitis. No malalignment. Small suprapatellar plica.  Soft tissues: Nonspecific prepatellar and superficial infrapatellar subcutaneous edema. Procedure Note  Salvadore Oxford, MD - 10/31/2022 Formatting of this note might be different from the original. TECHNIQUE:MRI KNEE LEFT WO IV CONTRAST-- Multiplanar, multisequence MR imaging of the knee was performed without contrast.  INDICATION: Left medial knee pain.  Number pain. Medial knee pain.  COMPARISON:  None.    FINDINGS:  Bones: No acute fracture, avascular necrosis, or marrow replacement. Patellar enthesopathic changes.  Anterior cruciate ligament: Intact.  Posterior cruciate ligament: Intact.  Medial meniscus: Complex tear of the body. Horizontal tear of the posterior horn extending to the inferior articular surface.  Lateral meniscus: Intact.  Medial collateral ligament: Edema superficial and deep to the MCL. Thickening of the proximal MCL. Irregularity of the deep fibers.  Lateral collateral ligamentous structures (including fibular collateral ligament, biceps femoris tendon, popliteus tendon, and posterolateral corner structures): Intact.  Cartilage: - Medial compartment: Partial-thickness fissuring of the proximal third weightbearing medial femoral condyle. Partial-thickness fissuring of the notch third medial tibial plateau. Reactive edema in the peripheral medial tibial plateau. - Lateral compartment: Low-grade chondral changes in the lateral tibial plateau. - Patellofemoral compartment:  Full-thickness fissuring and osteochondral abnormality in the lateral trochlea measuring 6 x 10 mm. Mild cartilage thinning of the median ridge of the patella.  Extensor mechanism, including quadriceps tendon, medial and lateral patellar retinaculum,  and patellar tendon: Mild patellar tendinosis.  IT band/pes anserine tendons: Intact.  Joint: Small effusion. Synovitis. No malalignment. Small suprapatellar plica.  Soft tissues: Nonspecific prepatellar and superficial infrapatellar subcutaneous edema.   IMPRESSION: 1.  Complex tear of the medial meniscus. 2.  Mild acute on chronic sprain of the MCL. 3.  Tricompartmental degenerative changes with focal osteochondral abnormality in the lateral trochlea. 4.  Mild patellar tendinosis. 5.  Small knee joint effusion with synovitis.  Electronically Signed by: Salvadore Oxford, MD on 10/31/2022 9:25 AM [place]  [id number] Exam End: 10/30/22 10:19   Specimen Collected: 10/31/22 09:16 Last Resulted: 10/31/22 09:25  Received From: [facility]  Result Received: 10/31/22 15:16   View Encounter

## 2022-11-01 NOTE — Telephone Encounter (Signed)
Requesting a location in [place] or [place].  [facility] would be [facility] [place].

## 2022-11-01 NOTE — Telephone Encounter (Signed)
Looks like there is some meniscal tearing as well as osteoarthritis, if knee continues to hurt he would need an injection, and if an injection fails then we will consider referral for likely knee replacement.

## 2022-11-01 NOTE — Telephone Encounter (Signed)
Happy to but before I do that does he have an orthopedic surgeon in mind?

## 2022-11-02 NOTE — Telephone Encounter (Signed)
Referral placed to [facility]

## 2023-01-14 ENCOUNTER — Encounter: Payer: Self-pay | Admitting: Family Medicine

## 2023-01-14 ENCOUNTER — Ambulatory Visit (INDEPENDENT_AMBULATORY_CARE_PROVIDER_SITE_OTHER): Payer: Medicare HMO | Admitting: Family Medicine

## 2023-01-14 VITALS — BP 135/70 | HR 79 | Ht 72.0 in | Wt 207.0 lb

## 2023-01-14 DIAGNOSIS — R809 Proteinuria, unspecified: Secondary | ICD-10-CM | POA: Diagnosis not present

## 2023-01-14 DIAGNOSIS — M25562 Pain in left knee: Secondary | ICD-10-CM | POA: Diagnosis not present

## 2023-01-14 DIAGNOSIS — E1129 Type 2 diabetes mellitus with other diabetic kidney complication: Secondary | ICD-10-CM

## 2023-01-14 DIAGNOSIS — I10 Essential (primary) hypertension: Secondary | ICD-10-CM | POA: Diagnosis not present

## 2023-01-14 NOTE — Assessment & Plan Note (Signed)
Acute on chronic left knee pain.  Encouraged application of ice to the area and continue use of knee brace.  Allow few days for this to settle down if not improving we can consider additional imaging or have him see Dr. Dianah Field as well.
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
## 2023-01-14 NOTE — Assessment & Plan Note (Signed)
Diabetes is well-controlled at current strength.  Recommend continuation of metformin.

## 2023-01-14 NOTE — Assessment & Plan Note (Addendum)
Blood pressure remains well-controlled at this time.  Recommend continuation lisinopril/hydrochlorothiazide.

## 2023-03-22 ENCOUNTER — Ambulatory Visit
Admission: EM | Admit: 2023-03-22 | Discharge: 2023-03-22 | Disposition: A | Discharge status: Home / Self Care | Payer: Medicare HMO | Attending: Family Medicine | Admitting: Family Medicine

## 2023-03-22 ENCOUNTER — Ambulatory Visit: Payer: Medicare HMO | Admitting: Family Medicine

## 2023-03-22 DIAGNOSIS — R079 Chest pain, unspecified: Secondary | ICD-10-CM | POA: Diagnosis not present

## 2023-03-22 NOTE — ED Triage Notes (Addendum)
Pt presents to uc with co of Left sided pec pain that he describes as a shocking sensation that comes and goes since yesterday. Pt denies any sob. Weakness, ha, and dizziness or radiating pain.
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
## 2023-03-22 NOTE — Discharge Instructions (Addendum)
Advised patient EKG was within normal limits and revealed normal sinus rhythm and similar in appearance to previous EKG of 03/24/2019.  Advised if symptoms worsen and/or unresolved please follow-up with PCP or here for further evaluation.

## 2023-03-28 LAB — LAB REPORT - SCANNED: A1c: 6.5

## 2023-04-05 DIAGNOSIS — H5203 Hypermetropia, bilateral: Secondary | ICD-10-CM | POA: Diagnosis not present

## 2023-04-05 DIAGNOSIS — E119 Type 2 diabetes mellitus without complications: Secondary | ICD-10-CM | POA: Diagnosis not present

## 2023-04-05 DIAGNOSIS — H52223 Regular astigmatism, bilateral: Secondary | ICD-10-CM | POA: Diagnosis not present

## 2023-04-05 DIAGNOSIS — H2513 Age-related nuclear cataract, bilateral: Secondary | ICD-10-CM | POA: Diagnosis not present

## 2023-04-05 DIAGNOSIS — H524 Presbyopia: Secondary | ICD-10-CM | POA: Diagnosis not present

## 2023-04-30 ENCOUNTER — Ambulatory Visit (INDEPENDENT_AMBULATORY_CARE_PROVIDER_SITE_OTHER): Payer: Medicare HMO | Admitting: Sports Medicine

## 2023-04-30 DIAGNOSIS — M1712 Unilateral primary osteoarthritis, left knee: Secondary | ICD-10-CM

## 2023-04-30 NOTE — Assessment & Plan Note (Signed)
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 He did have an injection at an outside facility in January.  It worked well. MRI was done in December that did show degenerative meniscal tearing and osteoarthritis. He is overall doing okay, uses topical Voltaren, occasional analgesics. Exam shows mild effusion and minimal medial joint line tenderness but he does not needing additional intervention, he will continue exercising, wearing a sleeve, and he can return to see me as needed.

## 2023-04-30 NOTE — Progress Notes (Signed)
    Procedures performed today:    None.  Independent interpretation of notes and tests performed by another provider:   None.  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 He did have an injection at an outside facility in January.  It worked well. MRI was done in December that did show degenerative meniscal tearing and osteoarthritis. He is overall doing okay, uses topical Voltaren, occasional analgesics. Exam shows mild effusion and minimal medial joint line tenderness but he does not needing additional intervention, he will continue exercising, wearing a sleeve, and he can return to see me as needed.    ____________________________________________ Ihor Austin. Benjamin Stain, M.D., ABFM., CAQSM., AME. Primary Care and Sports Medicine [facility]  Adjunct Professor of Family Medicine  [employer]  Restaurant manager, fast food

## 2023-05-10 ENCOUNTER — Telehealth: Payer: Self-pay | Admitting: Family Medicine

## 2023-05-10 ENCOUNTER — Other Ambulatory Visit: Payer: Self-pay | Admitting: Family Medicine

## 2023-05-10 DIAGNOSIS — G8929 Other chronic pain: Secondary | ICD-10-CM

## 2023-05-10 NOTE — Telephone Encounter (Signed)
Patient is requesting a referral for Orthopedic Surgeon for left knee for torn mensicus

## 2023-05-10 NOTE — Telephone Encounter (Signed)
Referral and clinical notes have been sent to [facility] through [place]. Office will contact patient to schedule referral appointment.

## 2023-05-13 ENCOUNTER — Ambulatory Visit (INDEPENDENT_AMBULATORY_CARE_PROVIDER_SITE_OTHER): Payer: Medicare HMO | Admitting: Surgical

## 2023-05-13 DIAGNOSIS — M1712 Unilateral primary osteoarthritis, left knee: Secondary | ICD-10-CM | POA: Diagnosis not present

## 2023-05-17 ENCOUNTER — Encounter: Payer: Self-pay | Admitting: Surgical

## 2023-05-17 MED ORDER — LIDOCAINE HCL 1 % IJ SOLN
5.0000 mL | INTRAMUSCULAR | Status: AC | PRN
  Administered 2023-05-13: 5 mL

## 2023-05-17 MED ORDER — METHYLPREDNISOLONE ACETATE 40 MG/ML IJ SUSP
40.0000 mg | INTRAMUSCULAR | Status: AC | PRN
  Administered 2023-05-13: 40 mg via INTRA_ARTICULAR

## 2023-05-17 MED ORDER — BUPIVACAINE HCL 0.25 % IJ SOLN
4.0000 mL | INTRAMUSCULAR | Status: AC | PRN
  Administered 2023-05-13: 4 mL via INTRA_ARTICULAR
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
## 2023-05-27 NOTE — Telephone Encounter (Signed)
Can we get him approved for gel injection?

## 2023-05-28 NOTE — Telephone Encounter (Signed)
VOB submitted for Monovisc, left knee 

## 2023-06-12 ENCOUNTER — Other Ambulatory Visit: Payer: Self-pay

## 2023-06-12 ENCOUNTER — Telehealth: Payer: Self-pay | Admitting: Surgical

## 2023-06-12 DIAGNOSIS — M1712 Unilateral primary osteoarthritis, left knee: Secondary | ICD-10-CM

## 2023-06-12 NOTE — Telephone Encounter (Signed)
Talked with patient and advised him that he was approved for Monovisc, left knee and that this was submitted and that the process with gel injections usually takes about 2 weeks.  Advised him that this is done online through a portal, voiced that he understands, but prefers to have this done at the [facility] due to living close by.  See referrals tab

## 2023-06-12 NOTE — Telephone Encounter (Signed)
Pt would like her MRI disc back he has an appt at the [facility] tomorrow because the gel injection was taking too long to get approved, pt was under the impression that Franky Macho did not put in the order I advised him the day after they talked on Mychart the order for the Gel injection was placed, pt stated he talked to [facility] and they claim they never got the request and that they called our office and we told them we did not send it. Pt would like to know if we can send the referral in to the [facility] in [place] so he can go there

## 2023-06-13 ENCOUNTER — Telehealth: Payer: Self-pay | Admitting: Family Medicine

## 2023-06-13 NOTE — Telephone Encounter (Signed)
Pt called to make an appointment to get gel shot. He states his insurance approved it. He is approved to get shot with his Orthopedic surgeon but he said he was told he could get the shot anywhere.  I have scheduled him to see Dr T tomorrow, is this okay?

## 2023-06-13 NOTE — Telephone Encounter (Signed)
Noted. Talked with patient on 06/12/2023.  See previous message in chart concerning gel injection.

## 2023-06-13 NOTE — Telephone Encounter (Signed)
Viewed CD, pretty sure I gave it back to [facility] yesterday

## 2023-06-13 NOTE — Telephone Encounter (Signed)
Called patient yesterday. 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  If that does not help, he will call us and at that point I would have him see Dr August Saucer to meet him and discuss knee arthroscopy.  We both reviewed his MRI scan showing mild degenerative changes of medial compartement with medial meniscal tear that seems responsible for his symptoms

## 2023-06-14 ENCOUNTER — Ambulatory Visit (INDEPENDENT_AMBULATORY_CARE_PROVIDER_SITE_OTHER): Payer: Medicare HMO | Admitting: Sports Medicine

## 2023-06-14 ENCOUNTER — Other Ambulatory Visit (INDEPENDENT_AMBULATORY_CARE_PROVIDER_SITE_OTHER): Payer: Medicare HMO

## 2023-06-14 DIAGNOSIS — M1712 Unilateral primary osteoarthritis, left knee: Secondary | ICD-10-CM

## 2023-06-14 MED ORDER — HYALURONAN 88 MG/4ML IX SOSY
88.0000 mg | PREFILLED_SYRINGE | Freq: Once | INTRA_ARTICULAR | Status: AC
Start: 2023-06-14 — End: 2023-06-14
  Administered 2023-06-14: 88 mg via INTRA_ARTICULAR

## 2023-06-14 NOTE — Assessment & Plan Note (Signed)
Kevin Wood returns, he is a pleasant 77 year old male with known left knee osteoarthritis x-ray and MRI confirmed, he shares that he has been approved for viscosupplementation, though the approval was done at an outside facility.  He understands that he will be responsible for the cost of the injection if not covered by his insurance, today we performed a Monovisc injection with ultrasound guidance. He also understands that it can take several weeks for efficacy to build. I like to see him back in about 6 weeks. He will do some home remedies such as icing and compression in the meantime.

## 2023-06-14 NOTE — Progress Notes (Signed)
    Procedures performed today:    Procedure: Real-time Ultrasound Guided injection of the left knee Device: Samsung HS60  Verbal informed consent obtained.  Time-out conducted.  Noted no overlying erythema, induration, or other signs of local infection.  Skin prepped in a sterile fashion.  Local anesthesia: Topical Ethyl chloride.  With sterile technique and under real time ultrasound guidance: Mild effusion noted, 88 mg/4 mL of MonoVisc (sodium hyaluronate) in a prefilled syringe was injected easily into the knee through a 22-gauge needle.   Advised to call if fevers/chills, erythema, induration, drainage, or persistent bleeding.  Images permanently stored and available for review in PACS.  Impression: Technically successful ultrasound guided injection.  Independent interpretation of notes and tests performed by another provider:   None.  Brief History, Exam, Impression, and Recommendations:    Primary osteoarthritis of left knee Kevin Wood returns, he is a pleasant 77 year old male with known left knee osteoarthritis x-ray and MRI confirmed, he shares that he has been approved for viscosupplementation, though the approval was done at an outside facility.  He understands that he will be responsible for the cost of the injection if not covered by his insurance, today we performed a Monovisc injection with ultrasound guidance. He also understands that it can take several weeks for efficacy to build. I like to see him back in about 6 weeks. He will do some home remedies such as icing and compression in the meantime.    ____________________________________________ Kevin Wood. Kevin Wood, M.D., ABFM., CAQSM., AME. Primary Care and Sports Medicine [facility]  Adjunct Professor of Family Medicine  [employer] [facility] [employer]  Restaurant manager, fast food

## 2023-07-10 ENCOUNTER — Encounter (INDEPENDENT_AMBULATORY_CARE_PROVIDER_SITE_OTHER): Payer: Medicare HMO | Admitting: Sports Medicine

## 2023-07-10 DIAGNOSIS — M1712 Unilateral primary osteoarthritis, left knee: Secondary | ICD-10-CM

## 2023-07-15 ENCOUNTER — Ambulatory Visit (INDEPENDENT_AMBULATORY_CARE_PROVIDER_SITE_OTHER): Payer: Medicare HMO | Admitting: Sports Medicine

## 2023-07-15 ENCOUNTER — Other Ambulatory Visit (INDEPENDENT_AMBULATORY_CARE_PROVIDER_SITE_OTHER): Payer: Medicare HMO

## 2023-07-15 ENCOUNTER — Encounter: Payer: Self-pay | Admitting: Sports Medicine

## 2023-07-15 DIAGNOSIS — Z7984 Long term (current) use of oral hypoglycemic drugs: Secondary | ICD-10-CM

## 2023-07-15 DIAGNOSIS — M1712 Unilateral primary osteoarthritis, left knee: Secondary | ICD-10-CM

## 2023-07-15 NOTE — Progress Notes (Signed)
    Procedures performed today:    Procedure: Real-time Ultrasound Guided injection of the left knee Device: Samsung HS60  Verbal informed consent obtained.  Time-out conducted.  Noted no overlying erythema, induration, or other signs of local infection.  Skin prepped in a sterile fashion.  Local anesthesia: Topical Ethyl chloride.  With sterile technique and under real time ultrasound guidance: Mild effusion noted, 1 cc Kenalog 40, 2 cc lidocaine, 2 cc bupivacaine injected easily Completed without difficulty  Advised to call if fevers/chills, erythema, induration, drainage, or persistent bleeding.  Images permanently stored and available for review in PACS.  Impression: Technically successful ultrasound guided injection.  Independent interpretation of notes and tests performed by another provider:   None.  Brief History, Exam, Impression, and Recommendations:    Primary osteoarthritis of left knee Kevin Wood returns, he is a pleasant 77 year old male, known knee osteoarthritis on the left, he failed Monovisc, today we did a steroid injection, return to see me 6 weeks as needed. If insufficient improvement we will consider PRP versus referral for geniculate artery embolization.    ____________________________________________ Ihor Austin. Benjamin Stain, M.D., ABFM., CAQSM., AME. Primary Care and Sports Medicine [facility]  Adjunct Professor of Family Medicine  [place] [employer] [facility] [employer]  Restaurant manager, fast food

## 2023-07-15 NOTE — Assessment & Plan Note (Signed)
Kevin Wood returns, he is a pleasant 77 year old male, known knee osteoarthritis on the left, he failed Monovisc, today we did a steroid injection, return to see me 6 weeks as needed. If insufficient improvement we will consider PRP versus referral for geniculate artery embolization.

## 2023-07-16 DIAGNOSIS — Z7984 Long term (current) use of oral hypoglycemic drugs: Secondary | ICD-10-CM | POA: Diagnosis not present

## 2023-07-16 DIAGNOSIS — M1712 Unilateral primary osteoarthritis, left knee: Secondary | ICD-10-CM | POA: Diagnosis not present

## 2023-07-16 MED ORDER — TRIAMCINOLONE ACETONIDE 40 MG/ML IJ SUSP
40.0000 mg | Freq: Once | INTRAMUSCULAR | Status: AC
Start: 2023-07-16 — End: 2023-07-16
  Administered 2023-07-16: 40 mg via INTRAMUSCULAR

## 2023-07-16 NOTE — Addendum Note (Signed)
Addended by: Carren Rang A on: 07/16/2023 04:53 PM   Modules accepted: Orders

## 2023-07-17 ENCOUNTER — Encounter: Payer: Self-pay | Admitting: Family Medicine

## 2023-07-17 ENCOUNTER — Ambulatory Visit (INDEPENDENT_AMBULATORY_CARE_PROVIDER_SITE_OTHER): Payer: Medicare HMO | Admitting: Family Medicine

## 2023-07-17 VITALS — BP 119/70 | HR 66 | Ht 72.0 in | Wt 210.0 lb

## 2023-07-17 DIAGNOSIS — L918 Other hypertrophic disorders of the skin: Secondary | ICD-10-CM | POA: Diagnosis not present

## 2023-07-17 DIAGNOSIS — Z7984 Long term (current) use of oral hypoglycemic drugs: Secondary | ICD-10-CM

## 2023-07-17 DIAGNOSIS — E782 Mixed hyperlipidemia: Secondary | ICD-10-CM | POA: Diagnosis not present

## 2023-07-17 DIAGNOSIS — E1129 Type 2 diabetes mellitus with other diabetic kidney complication: Secondary | ICD-10-CM | POA: Diagnosis not present

## 2023-07-17 DIAGNOSIS — R809 Proteinuria, unspecified: Secondary | ICD-10-CM

## 2023-07-17 DIAGNOSIS — Z23 Encounter for immunization: Secondary | ICD-10-CM | POA: Diagnosis not present

## 2023-07-17 DIAGNOSIS — Z7729 Contact with and (suspected ) exposure to other hazardous substances: Secondary | ICD-10-CM | POA: Insufficient documentation

## 2023-07-17 LAB — POCT GLYCOSYLATED HEMOGLOBIN (HGB A1C): HbA1c, POC (controlled diabetic range): 6.3 % (ref 0.0–7.0)

## 2023-07-17 LAB — POCT UA - MICROALBUMIN
Albumin/Creatinine Ratio, Urine, POC: <30
Creatinine, POC: 100 mg/dL
Microalbumin Ur, POC: 30 mg/L

## 2023-07-17 NOTE — Assessment & Plan Note (Addendum)
Diabetes is well-controlled at current strength of metformin, recommend continuation.

## 2023-07-17 NOTE — Assessment & Plan Note (Addendum)
Lab Results  Component Value Date   LDLCALC 64 02/05/2022  Continue atorvastatin at current strength.  He will have updated labs completed through the [place] next month.

## 2023-07-17 NOTE — Patient Instructions (Signed)
Labs for [facility]: CMP, CBC, Lipid panel

## 2023-07-17 NOTE — Assessment & Plan Note (Signed)
Blood pressure remains well-controlled at this time.  Recommend continuation lisinopril/hydrochlorothiazide.
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
## 2023-07-17 NOTE — Assessment & Plan Note (Signed)
Inflamed skin tag treated with liquid nitrogen.  Tolerated this well.

## 2023-07-26 ENCOUNTER — Ambulatory Visit: Payer: Medicare HMO | Admitting: Sports Medicine

## 2023-07-29 MED ORDER — DICLOFENAC SODIUM 1 % EX GEL: 4.0000 g | Freq: Four times a day (QID) | CUTANEOUS | 11 refills | Status: DC

## 2023-07-29 NOTE — Telephone Encounter (Signed)

## 2023-08-22 ENCOUNTER — Telehealth: Payer: Self-pay

## 2023-08-22 NOTE — Telephone Encounter (Signed)
Patient came info office to inform that he will be having knee surgery tommorow, thanks.

## 2023-08-26 ENCOUNTER — Ambulatory Visit: Payer: Medicare HMO | Admitting: Sports Medicine

## 2023-10-15 ENCOUNTER — Ambulatory Visit (INDEPENDENT_AMBULATORY_CARE_PROVIDER_SITE_OTHER): Payer: Medicare HMO | Admitting: Family Medicine

## 2023-10-15 VITALS — BP 111/69 | HR 82 | Ht 72.0 in | Wt 210.0 lb

## 2023-10-15 DIAGNOSIS — M79674 Pain in right toe(s): Secondary | ICD-10-CM

## 2023-10-15 DIAGNOSIS — M79676 Pain in unspecified toe(s): Secondary | ICD-10-CM | POA: Insufficient documentation

## 2023-10-15 DIAGNOSIS — S83232D Complex tear of medial meniscus, current injury, left knee, subsequent encounter: Secondary | ICD-10-CM | POA: Insufficient documentation

## 2023-10-15 MED ORDER — CEPHALEXIN 500 MG PO CAPS: 500.0000 mg | ORAL_CAPSULE | Freq: Four times a day (QID) | ORAL | 0 refills | Status: DC

## 2023-10-15 MED ORDER — COLCHICINE 0.6 MG PO TABS: ORAL_TABLET | ORAL | 0 refills | Status: DC

## 2023-10-15 NOTE — Assessment & Plan Note (Signed)
Pain is more along the IP joint with erythema of skin raising concern for cellulitis.  He does have a history of gout as well but no significant ttp in the MTP joint.  I will cover for both and have sent in cephalexin 500mg  qid  x7 days for cellulitis and colchicine for management of possible gout.  Red flags and precautions reviewed.
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
## 2023-12-12 DIAGNOSIS — M1712 Unilateral primary osteoarthritis, left knee: Secondary | ICD-10-CM | POA: Diagnosis not present

## 2023-12-12 DIAGNOSIS — M25562 Pain in left knee: Secondary | ICD-10-CM | POA: Diagnosis not present

## 2023-12-12 DIAGNOSIS — G8929 Other chronic pain: Secondary | ICD-10-CM | POA: Diagnosis not present

## 2024-01-08 ENCOUNTER — Encounter: Payer: Self-pay | Admitting: Family Medicine

## 2024-01-08 ENCOUNTER — Ambulatory Visit (INDEPENDENT_AMBULATORY_CARE_PROVIDER_SITE_OTHER): Payer: Medicare HMO | Admitting: Family Medicine

## 2024-01-08 VITALS — BP 118/67 | HR 72 | Ht 72.0 in | Wt 204.0 lb

## 2024-01-08 DIAGNOSIS — I10 Essential (primary) hypertension: Secondary | ICD-10-CM | POA: Diagnosis not present

## 2024-01-08 DIAGNOSIS — E782 Mixed hyperlipidemia: Secondary | ICD-10-CM

## 2024-01-08 DIAGNOSIS — R0789 Other chest pain: Secondary | ICD-10-CM | POA: Insufficient documentation

## 2024-01-08 DIAGNOSIS — E1129 Type 2 diabetes mellitus with other diabetic kidney complication: Secondary | ICD-10-CM

## 2024-01-08 DIAGNOSIS — M1712 Unilateral primary osteoarthritis, left knee: Secondary | ICD-10-CM | POA: Diagnosis not present

## 2024-01-08 DIAGNOSIS — R809 Proteinuria, unspecified: Secondary | ICD-10-CM | POA: Diagnosis not present

## 2024-01-08 DIAGNOSIS — R253 Fasciculation: Secondary | ICD-10-CM | POA: Diagnosis not present

## 2024-01-08 LAB — POCT GLYCOSYLATED HEMOGLOBIN (HGB A1C): HbA1c, POC (controlled diabetic range): 6 % (ref 0.0–7.0)

## 2024-01-08 NOTE — Assessment & Plan Note (Signed)
 He has upcoming left knee arthroplasty.

## 2024-01-08 NOTE — Assessment & Plan Note (Addendum)
 Lab Results  Component Value Date   LDLCALC 64 02/05/2022  Continue atorvastatin at current strength.  He will get me a copy of his last set of labs through the [place].

## 2024-01-08 NOTE — Assessment & Plan Note (Addendum)
 Blood pressure remains well-controlled.  He will continue lisinopril with hydrochlorothiazide at current strength.  Checking electrolytes as well as TSH as he has had some twitching of his lower extremities although I think this is probably related to muscle fatigue from favoring this leg due to his left knee pain.
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
## 2024-01-08 NOTE — Assessment & Plan Note (Signed)
Diabetes is well-controlled at current strength of metformin, recommend continuation.

## 2024-01-09 DIAGNOSIS — M109 Gout, unspecified: Secondary | ICD-10-CM | POA: Diagnosis not present

## 2024-01-09 DIAGNOSIS — E782 Mixed hyperlipidemia: Secondary | ICD-10-CM | POA: Diagnosis not present

## 2024-01-09 DIAGNOSIS — Z01818 Encounter for other preprocedural examination: Secondary | ICD-10-CM | POA: Diagnosis not present

## 2024-01-09 DIAGNOSIS — I10 Essential (primary) hypertension: Secondary | ICD-10-CM | POA: Diagnosis not present

## 2024-01-09 DIAGNOSIS — M545 Low back pain, unspecified: Secondary | ICD-10-CM | POA: Diagnosis not present

## 2024-01-09 DIAGNOSIS — E119 Type 2 diabetes mellitus without complications: Secondary | ICD-10-CM | POA: Diagnosis not present

## 2024-01-09 DIAGNOSIS — G8929 Other chronic pain: Secondary | ICD-10-CM | POA: Diagnosis not present

## 2024-01-09 DIAGNOSIS — K5900 Constipation, unspecified: Secondary | ICD-10-CM | POA: Diagnosis not present

## 2024-01-09 LAB — BASIC METABOLIC PANEL
BUN/Creatinine Ratio: 19 (ref 10–24)
BUN: 21 mg/dL (ref 8–27)
CO2: 20 mmol/L (ref 20–29)
Calcium: 8.8 mg/dL (ref 8.6–10.2)
Chloride: 103 mmol/L (ref 96–106)
Creatinine, Ser: 1.09 mg/dL (ref 0.76–1.27)
Glucose: 115 mg/dL — ABNORMAL HIGH (ref 70–99)
Potassium: 4.2 mmol/L (ref 3.5–5.2)
Sodium: 142 mmol/L (ref 134–144)
eGFR: 71 mL/min/{1.73_m2} (ref >59)

## 2024-01-09 LAB — SPECIMEN STATUS REPORT

## 2024-01-09 LAB — TSH: TSH: 0.959 u[IU]/mL (ref 0.450–4.500)

## 2024-01-09 LAB — MAGNESIUM: Magnesium: 2.1 mg/dL (ref 1.6–2.3)

## 2024-01-10 ENCOUNTER — Ambulatory Visit: Payer: Medicare HMO | Admitting: Family Medicine

## 2024-01-12 ENCOUNTER — Encounter: Payer: Self-pay | Admitting: Family Medicine

## 2024-01-14 ENCOUNTER — Ambulatory Visit: Payer: Medicare HMO | Admitting: Family Medicine

## 2024-01-23 DIAGNOSIS — Z96652 Presence of left artificial knee joint: Secondary | ICD-10-CM | POA: Insufficient documentation

## 2024-02-05 DIAGNOSIS — G8918 Other acute postprocedural pain: Secondary | ICD-10-CM | POA: Diagnosis not present

## 2024-02-05 DIAGNOSIS — M25562 Pain in left knee: Secondary | ICD-10-CM | POA: Diagnosis not present

## 2024-02-10 DIAGNOSIS — G8918 Other acute postprocedural pain: Secondary | ICD-10-CM | POA: Diagnosis not present

## 2024-02-10 DIAGNOSIS — M25562 Pain in left knee: Secondary | ICD-10-CM | POA: Diagnosis not present

## 2024-02-13 DIAGNOSIS — M25562 Pain in left knee: Secondary | ICD-10-CM | POA: Diagnosis not present

## 2024-02-13 DIAGNOSIS — G8918 Other acute postprocedural pain: Secondary | ICD-10-CM | POA: Diagnosis not present

## 2024-02-17 DIAGNOSIS — G8918 Other acute postprocedural pain: Secondary | ICD-10-CM | POA: Diagnosis not present

## 2024-02-17 DIAGNOSIS — M25562 Pain in left knee: Secondary | ICD-10-CM | POA: Diagnosis not present

## 2024-02-19 DIAGNOSIS — M7989 Other specified soft tissue disorders: Secondary | ICD-10-CM | POA: Diagnosis not present

## 2024-02-19 DIAGNOSIS — G8918 Other acute postprocedural pain: Secondary | ICD-10-CM | POA: Diagnosis not present

## 2024-02-19 DIAGNOSIS — Z96652 Presence of left artificial knee joint: Secondary | ICD-10-CM | POA: Diagnosis not present

## 2024-02-19 DIAGNOSIS — M25562 Pain in left knee: Secondary | ICD-10-CM | POA: Diagnosis not present

## 2024-02-26 DIAGNOSIS — G8918 Other acute postprocedural pain: Secondary | ICD-10-CM | POA: Diagnosis not present

## 2024-02-26 DIAGNOSIS — M25562 Pain in left knee: Secondary | ICD-10-CM | POA: Diagnosis not present

## 2024-03-02 ENCOUNTER — Ambulatory Visit (INDEPENDENT_AMBULATORY_CARE_PROVIDER_SITE_OTHER): Admitting: Family Medicine

## 2024-03-02 ENCOUNTER — Encounter: Payer: Self-pay | Admitting: Family Medicine

## 2024-03-02 VITALS — BP 112/74 | HR 84 | Ht 72.0 in | Wt 189.0 lb

## 2024-03-02 DIAGNOSIS — I10 Essential (primary) hypertension: Secondary | ICD-10-CM | POA: Diagnosis not present

## 2024-03-02 DIAGNOSIS — R200 Anesthesia of skin: Secondary | ICD-10-CM

## 2024-03-02 DIAGNOSIS — M6240 Contracture of muscle, unspecified site: Secondary | ICD-10-CM | POA: Diagnosis not present
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
## 2024-03-02 NOTE — Assessment & Plan Note (Signed)
 Possibly due to increased use of right hand when ambulating with cane.  Negative Tinel at ulnar and Guyon's canal.  No significant weakness on exam.

## 2024-03-02 NOTE — Assessment & Plan Note (Signed)
 Check electrolytes as well as CBC and ferritin.  Likely due to increased use on the right side with rehabilitation for the left knee.

## 2024-03-03 LAB — CBC WITH DIFFERENTIAL/PLATELET
Basophils Absolute: 0 10*3/uL (ref 0.0–0.2)
Basos: 1 %
EOS (ABSOLUTE): 0.1 10*3/uL (ref 0.0–0.4)
Eos: 2 %
Hematocrit: 45 % (ref 37.5–51.0)
Hemoglobin: 14.9 g/dL (ref 13.0–17.7)
Immature Grans (Abs): 0 10*3/uL (ref 0.0–0.1)
Immature Granulocytes: 0 %
Lymphocytes Absolute: 0.8 10*3/uL (ref 0.7–3.1)
Lymphs: 23 %
MCH: 29.2 pg (ref 26.6–33.0)
MCHC: 33.1 g/dL (ref 31.5–35.7)
MCV: 88 fL (ref 79–97)
Monocytes Absolute: 0.4 10*3/uL (ref 0.1–0.9)
Monocytes: 11 %
Neutrophils Absolute: 2.3 10*3/uL (ref 1.4–7.0)
Neutrophils: 63 %
Platelets: 167 10*3/uL (ref 150–450)
RBC: 5.11 x10E6/uL (ref 4.14–5.80)
RDW: 13 % (ref 11.6–15.4)
WBC: 3.6 10*3/uL (ref 3.4–10.8)

## 2024-03-03 LAB — BASIC METABOLIC PANEL WITH GFR
BUN/Creatinine Ratio: 22 (ref 10–24)
BUN: 20 mg/dL (ref 8–27)
CO2: 27 mmol/L (ref 20–29)
Calcium: 9.1 mg/dL (ref 8.6–10.2)
Chloride: 99 mmol/L (ref 96–106)
Creatinine, Ser: 0.93 mg/dL (ref 0.76–1.27)
Glucose: 100 mg/dL — ABNORMAL HIGH (ref 70–99)
Potassium: 4 mmol/L (ref 3.5–5.2)
Sodium: 140 mmol/L (ref 134–144)
eGFR: 86 mL/min/{1.73_m2} (ref >59)

## 2024-03-03 LAB — IRON,TIBC AND FERRITIN PANEL
Ferritin: 180 ng/mL (ref 30–400)
Iron Saturation: 33 % (ref 15–55)
Iron: 85 ug/dL (ref 38–169)
Total Iron Binding Capacity: 258 ug/dL (ref 250–450)
UIBC: 173 ug/dL (ref 111–343)

## 2024-03-03 LAB — MAGNESIUM: Magnesium: 2.1 mg/dL (ref 1.6–2.3)

## 2024-03-11 ENCOUNTER — Encounter: Payer: Self-pay | Admitting: Family Medicine

## 2024-03-11 ENCOUNTER — Encounter (INDEPENDENT_AMBULATORY_CARE_PROVIDER_SITE_OTHER): Payer: Self-pay | Admitting: Family Medicine

## 2024-03-11 DIAGNOSIS — E790 Hyperuricemia without signs of inflammatory arthritis and tophaceous disease: Secondary | ICD-10-CM

## 2024-03-11 DIAGNOSIS — M109 Gout, unspecified: Secondary | ICD-10-CM | POA: Diagnosis not present

## 2024-03-12 MED ORDER — PREDNISONE 50 MG PO TABS: ORAL_TABLET | ORAL | 0 refills | Status: DC

## 2024-03-12 NOTE — Telephone Encounter (Signed)
Please see the MyChart message reply(ies) for my assessment and plan.    This patient gave consent for this Medical Advice Message and is aware that it may result in a bill to their insurance company, as well as the possibility of receiving a bill for a co-payment or deductible. They are an established patient, but are not seeking medical advice exclusively about a problem treated during an in person or video visit in the last seven days. I did not recommend an in person or video visit within seven days of my reply.    I spent a total of 5 minutes cumulative time within 7 days through MyChart messaging.   , DO   

## 2024-03-25 ENCOUNTER — Encounter

## 2024-04-09 ENCOUNTER — Ambulatory Visit

## 2024-04-09 VITALS — Ht 72.0 in | Wt 185.0 lb

## 2024-04-09 DIAGNOSIS — Z0000 Encounter for general adult medical examination without abnormal findings: Secondary | ICD-10-CM

## 2024-04-09 NOTE — Patient Instructions (Signed)
  Kevin Wood , Thank you for taking time to come for your Medicare Wellness Visit. I appreciate your ongoing commitment to your health goals. Please review the following plan we discussed and let me know if I can assist you in the future.   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      Patient Stated (pt-stated)      01/13/2021 AWV Goal: Diabetes Management  Patient will maintain an A1C level below 8.0 Patient will not develop any diabetic foot complications Patient will not experience any hypoglycemic episodes over the next 3 months Patient will notify our office of any CBG readings outside of the provider recommended range by calling [phone]. Patient will adhere to provider recommendations for diabetes management  Patient Self Management Activities take all medications as prescribed and report any negative side effects monitor and record blood sugar readings as directed adhere to a low carbohydrate diet that incorporates lean proteins, vegetables, whole grains, low glycemic fruits check feet daily noting any sores, cracks, injuries, or callous formations see PCP or podiatrist if he notices any changes in his legs, feet, or toenails Patient will visit PCP and have an A1C level checked every 3 to 6 months as directed  have a yearly eye exam to monitor for vascular changes associated with diabetes and will request that the report be sent to his pcp.  consult with his PCP regarding any changes in his health or new or worsening symptoms       Patient Stated (pt-stated)      Continue to stay healthy.       Patient Stated      Patient states he would like to rehab his left knee.         This is a list of the screening recommended for you and due dates:  Health Maintenance  Topic Date Due   Eye exam for diabetics  09/01/2021   COVID-19 Vaccine (6 - 2024-25 season) 07/07/2023   Flu Shot   06/05/2024   Hemoglobin A1C  07/10/2024   Yearly kidney health urinalysis for diabetes  07/16/2024   Complete foot exam   07/16/2024   Yearly kidney function blood test for diabetes  03/02/2025   Medicare Annual Wellness Visit  04/09/2025   Colon Cancer Screening  10/22/2025   DTaP/Tdap/Td vaccine (3 - Td or Tdap) 10/21/2029   Pneumonia Vaccine  Completed   Hepatitis C Screening  Completed   Zoster (Shingles) Vaccine  Completed   HPV Vaccine  Aged Out   Meningitis B Vaccine  Aged Out
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
## 2024-04-14 ENCOUNTER — Telehealth: Payer: Self-pay

## 2024-04-14 DIAGNOSIS — M109 Gout, unspecified: Secondary | ICD-10-CM

## 2024-04-14 MED ORDER — PREDNISONE 50 MG PO TABS: 50.0000 mg | ORAL_TABLET | Freq: Every day | ORAL | 0 refills | Status: DC

## 2024-04-14 NOTE — Telephone Encounter (Signed)
 Copied from CRM [id number]. Topic: Clinical - Medication Question >> Apr 14, 2024  9:46 AM Blair Bumpers wrote: Reason for CRM: Patient called in stating that Dr. Augustus Ledger previously wrote a prescription for him for gout. He states it has come back and wants to know if Dr. Augustus Ledger can send in a new prescription for Prednisone  to the [facility] on [address] in [place]? Please give patient a call back to advise. CB #: [id number].

## 2024-04-14 NOTE — Telephone Encounter (Signed)
 Patient informed of medication being sent as well as discussing with PCP about preventative medication and dietary changes to help reduce flare ups . [facility] and will discuss this with them .

## 2024-04-14 NOTE — Telephone Encounter (Signed)
 Prednisone  burst sent to pharmacy. Just had prednisone  a month ago so we need to be careful not to use this more than a few times a year. Would recommend he discuss preventative treatment with his PCP and work on dietary changes to limit flares.   ___________________________________________ Maryl Snook, DNP, APRN, FNP-BC Primary Care and Sports Medicine [facility]

## 2024-04-14 NOTE — Telephone Encounter (Signed)
 Forwarding message to Christen Butter covering Dr. Ashley Royalty

## 2024-07-07 ENCOUNTER — Encounter: Payer: Self-pay | Admitting: Sports Medicine

## 2024-08-04 ENCOUNTER — Ambulatory Visit: Admitting: Family Medicine

## 2024-08-05 ENCOUNTER — Ambulatory Visit

## 2024-08-05 ENCOUNTER — Ambulatory Visit: Admitting: Family Medicine

## 2024-08-05 ENCOUNTER — Encounter: Payer: Self-pay | Admitting: Family Medicine

## 2024-08-05 VITALS — BP 116/72 | HR 72 | Ht 72.0 in | Wt 201.0 lb

## 2024-08-05 DIAGNOSIS — M5412 Radiculopathy, cervical region: Secondary | ICD-10-CM

## 2024-08-05 DIAGNOSIS — I10 Essential (primary) hypertension: Secondary | ICD-10-CM

## 2024-08-05 DIAGNOSIS — E782 Mixed hyperlipidemia: Secondary | ICD-10-CM | POA: Diagnosis not present

## 2024-08-05 DIAGNOSIS — Z23 Encounter for immunization: Secondary | ICD-10-CM

## 2024-08-05 DIAGNOSIS — R809 Proteinuria, unspecified: Secondary | ICD-10-CM | POA: Diagnosis not present

## 2024-08-05 DIAGNOSIS — M4722 Other spondylosis with radiculopathy, cervical region: Secondary | ICD-10-CM | POA: Diagnosis not present

## 2024-08-05 DIAGNOSIS — E1129 Type 2 diabetes mellitus with other diabetic kidney complication: Secondary | ICD-10-CM

## 2024-08-05 DIAGNOSIS — M5011 Cervical disc disorder with radiculopathy,  high cervical region: Secondary | ICD-10-CM | POA: Diagnosis not present

## 2024-08-05 LAB — POCT GLYCOSYLATED HEMOGLOBIN (HGB A1C): HbA1c, POC (prediabetic range): 6.1 % (ref 5.7–6.4)
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
## 2024-08-05 NOTE — Assessment & Plan Note (Signed)
 Lab Results  Component Value Date   LDLCALC 64 02/05/2022  Continue atorvastatin at current strength.  He will get me a copy of his last set of labs through the [facility].

## 2024-08-05 NOTE — Assessment & Plan Note (Signed)
 Blood sugars remain well-controlled.  Now back into the prediabetic range.  Encouraged continued dietary changes.  Continue lisinopril  for management of microalbuminuria.

## 2024-08-05 NOTE — Assessment & Plan Note (Signed)
 Updating x-rays of cervical spine.  Referral placed to physical therapy.

## 2024-08-05 NOTE — Assessment & Plan Note (Signed)
 Blood pressure remains well-controlled.  He will continue lisinopril with hydrochlorothiazide at current strength.

## 2024-08-07 NOTE — Progress Notes (Signed)
 Kevin Wood                                          MRN: [id number]   08/07/2024   The [facility] Quality Team Specialist reviewed this patient medical record for the purposes of chart review for care gap closure. The following were reviewed: chart review for care gap closure-kidney health evaluation for diabetes:eGFR  and uACR.    [facility] Quality Team
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
## 2024-08-10 ENCOUNTER — Ambulatory Visit: Attending: Family Medicine | Admitting: Rehabilitative and Restorative Service Providers"

## 2024-08-10 ENCOUNTER — Encounter: Payer: Self-pay | Admitting: Rehabilitative and Restorative Service Providers"

## 2024-08-10 ENCOUNTER — Other Ambulatory Visit: Payer: Self-pay

## 2024-08-10 DIAGNOSIS — M539 Dorsopathy, unspecified: Secondary | ICD-10-CM | POA: Diagnosis not present

## 2024-08-10 DIAGNOSIS — R293 Abnormal posture: Secondary | ICD-10-CM | POA: Diagnosis not present

## 2024-08-10 DIAGNOSIS — R29898 Other symptoms and signs involving the musculoskeletal system: Secondary | ICD-10-CM | POA: Diagnosis not present

## 2024-08-10 DIAGNOSIS — M5412 Radiculopathy, cervical region: Secondary | ICD-10-CM | POA: Diagnosis not present

## 2024-08-10 DIAGNOSIS — M6281 Muscle weakness (generalized): Secondary | ICD-10-CM | POA: Diagnosis not present

## 2024-08-12 ENCOUNTER — Ambulatory Visit: Payer: Self-pay | Admitting: Family Medicine

## 2024-08-13 ENCOUNTER — Ambulatory Visit: Admitting: Rehabilitative and Restorative Service Providers"

## 2024-08-13 NOTE — Telephone Encounter (Signed)
 Kevin Wood failed to show for scheduled appointment. Spoke with him by phone and he reported that he did not have today's appointment on his schedule. Will be here for appointments next week.   Aaronmichael Brumbaugh P. Ina PT, MPH 08/13/24 4:24 PM

## 2024-08-17 ENCOUNTER — Ambulatory Visit

## 2024-08-17 DIAGNOSIS — M539 Dorsopathy, unspecified: Secondary | ICD-10-CM

## 2024-08-17 DIAGNOSIS — R29898 Other symptoms and signs involving the musculoskeletal system: Secondary | ICD-10-CM

## 2024-08-17 DIAGNOSIS — R293 Abnormal posture: Secondary | ICD-10-CM | POA: Diagnosis not present

## 2024-08-17 DIAGNOSIS — M6281 Muscle weakness (generalized): Secondary | ICD-10-CM

## 2024-08-17 DIAGNOSIS — M5412 Radiculopathy, cervical region: Secondary | ICD-10-CM | POA: Diagnosis not present
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
## 2024-08-19 ENCOUNTER — Ambulatory Visit

## 2024-08-19 DIAGNOSIS — M5412 Radiculopathy, cervical region: Secondary | ICD-10-CM | POA: Diagnosis not present

## 2024-08-19 DIAGNOSIS — R29898 Other symptoms and signs involving the musculoskeletal system: Secondary | ICD-10-CM | POA: Diagnosis not present

## 2024-08-19 DIAGNOSIS — R293 Abnormal posture: Secondary | ICD-10-CM

## 2024-08-19 DIAGNOSIS — M6281 Muscle weakness (generalized): Secondary | ICD-10-CM | POA: Diagnosis not present

## 2024-08-19 DIAGNOSIS — M539 Dorsopathy, unspecified: Secondary | ICD-10-CM
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
## 2024-08-20 DIAGNOSIS — I10 Essential (primary) hypertension: Secondary | ICD-10-CM | POA: Diagnosis not present

## 2024-08-20 DIAGNOSIS — M76892 Other specified enthesopathies of left lower limb, excluding foot: Secondary | ICD-10-CM | POA: Diagnosis not present

## 2024-08-20 DIAGNOSIS — Z96652 Presence of left artificial knee joint: Secondary | ICD-10-CM | POA: Diagnosis not present

## 2024-08-24 ENCOUNTER — Encounter: Payer: Self-pay | Admitting: Rehabilitative and Restorative Service Providers"

## 2024-08-24 ENCOUNTER — Ambulatory Visit: Admitting: Rehabilitative and Restorative Service Providers"

## 2024-08-24 DIAGNOSIS — M5412 Radiculopathy, cervical region: Secondary | ICD-10-CM | POA: Diagnosis not present

## 2024-08-24 DIAGNOSIS — M6281 Muscle weakness (generalized): Secondary | ICD-10-CM | POA: Diagnosis not present

## 2024-08-24 DIAGNOSIS — R293 Abnormal posture: Secondary | ICD-10-CM | POA: Diagnosis not present

## 2024-08-24 DIAGNOSIS — M539 Dorsopathy, unspecified: Secondary | ICD-10-CM

## 2024-08-24 DIAGNOSIS — R29898 Other symptoms and signs involving the musculoskeletal system: Secondary | ICD-10-CM | POA: Diagnosis not present
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
## 2024-08-26 ENCOUNTER — Ambulatory Visit

## 2024-08-26 DIAGNOSIS — M5412 Radiculopathy, cervical region: Secondary | ICD-10-CM | POA: Diagnosis not present

## 2024-08-26 DIAGNOSIS — R293 Abnormal posture: Secondary | ICD-10-CM | POA: Diagnosis not present

## 2024-08-26 DIAGNOSIS — M539 Dorsopathy, unspecified: Secondary | ICD-10-CM

## 2024-08-26 DIAGNOSIS — R29898 Other symptoms and signs involving the musculoskeletal system: Secondary | ICD-10-CM | POA: Diagnosis not present

## 2024-08-26 DIAGNOSIS — M6281 Muscle weakness (generalized): Secondary | ICD-10-CM
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
## 2024-08-28 DIAGNOSIS — Z833 Family history of diabetes mellitus: Secondary | ICD-10-CM | POA: Diagnosis not present

## 2024-08-28 DIAGNOSIS — I10 Essential (primary) hypertension: Secondary | ICD-10-CM | POA: Diagnosis not present

## 2024-08-28 DIAGNOSIS — E1142 Type 2 diabetes mellitus with diabetic polyneuropathy: Secondary | ICD-10-CM | POA: Diagnosis not present

## 2024-08-28 DIAGNOSIS — E785 Hyperlipidemia, unspecified: Secondary | ICD-10-CM | POA: Diagnosis not present

## 2024-08-28 DIAGNOSIS — M1990 Unspecified osteoarthritis, unspecified site: Secondary | ICD-10-CM | POA: Diagnosis not present

## 2024-08-28 DIAGNOSIS — Z809 Family history of malignant neoplasm, unspecified: Secondary | ICD-10-CM | POA: Diagnosis not present

## 2024-08-28 DIAGNOSIS — M4800 Spinal stenosis, site unspecified: Secondary | ICD-10-CM | POA: Diagnosis not present

## 2024-08-28 DIAGNOSIS — K5900 Constipation, unspecified: Secondary | ICD-10-CM | POA: Diagnosis not present

## 2024-08-28 DIAGNOSIS — E1136 Type 2 diabetes mellitus with diabetic cataract: Secondary | ICD-10-CM | POA: Diagnosis not present

## 2024-09-01 ENCOUNTER — Ambulatory Visit: Admitting: Rehabilitative and Restorative Service Providers"

## 2024-09-01 ENCOUNTER — Encounter: Payer: Self-pay | Admitting: Rehabilitative and Restorative Service Providers"

## 2024-09-01 DIAGNOSIS — M6281 Muscle weakness (generalized): Secondary | ICD-10-CM

## 2024-09-01 DIAGNOSIS — R29898 Other symptoms and signs involving the musculoskeletal system: Secondary | ICD-10-CM | POA: Diagnosis not present

## 2024-09-01 DIAGNOSIS — M539 Dorsopathy, unspecified: Secondary | ICD-10-CM | POA: Diagnosis not present

## 2024-09-01 DIAGNOSIS — M5412 Radiculopathy, cervical region: Secondary | ICD-10-CM | POA: Diagnosis not present

## 2024-09-01 DIAGNOSIS — R293 Abnormal posture: Secondary | ICD-10-CM
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
## 2024-09-03 ENCOUNTER — Encounter: Payer: Self-pay | Admitting: Rehabilitative and Restorative Service Providers"

## 2024-09-03 ENCOUNTER — Ambulatory Visit: Admitting: Rehabilitative and Restorative Service Providers"

## 2024-09-03 DIAGNOSIS — M539 Dorsopathy, unspecified: Secondary | ICD-10-CM

## 2024-09-03 DIAGNOSIS — R29898 Other symptoms and signs involving the musculoskeletal system: Secondary | ICD-10-CM | POA: Diagnosis not present

## 2024-09-03 DIAGNOSIS — M6281 Muscle weakness (generalized): Secondary | ICD-10-CM | POA: Diagnosis not present

## 2024-09-03 DIAGNOSIS — R293 Abnormal posture: Secondary | ICD-10-CM | POA: Diagnosis not present

## 2024-09-03 DIAGNOSIS — M5412 Radiculopathy, cervical region: Secondary | ICD-10-CM | POA: Diagnosis not present
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
## 2024-09-14 ENCOUNTER — Ambulatory Visit: Admitting: Rehabilitative and Restorative Service Providers"

## 2024-09-17 LAB — LIPID PANEL
Cholesterol: 140 (ref 0–200)
HDL: 45 (ref 35–70)
LDL Cholesterol: 86
Triglycerides: 112 (ref 40–160)

## 2024-09-17 LAB — BASIC METABOLIC PANEL WITH GFR
BUN: 17 (ref 4–21)
CO2: 32 — AB (ref 13–22)
Chloride: 102 (ref 99–108)
Creatinine: 1.1 (ref 0.6–1.3)
Glucose: 128
Potassium: 4.1 meq/L (ref 3.5–5.1)
Sodium: 140 (ref 137–147)

## 2024-09-17 LAB — CBC AND DIFFERENTIAL
Platelets: 166 K/uL (ref 150–400)
WBC: 4.4

## 2024-09-17 LAB — HEPATIC FUNCTION PANEL
Alkaline Phosphatase: 95 (ref 25–125)
Bilirubin, Total: 0.6

## 2024-09-17 LAB — COMPREHENSIVE METABOLIC PANEL WITH GFR
Albumin: 4.8 (ref 3.5–5.0)
eGFR: 69

## 2024-09-17 LAB — TSH: TSH: 0.75 (ref 0.41–5.90)

## 2024-09-17 LAB — HEMOGLOBIN A1C: Hemoglobin A1C: 6.3

## 2024-09-17 LAB — VITAMIN D 25 HYDROXY (VIT D DEFICIENCY, FRACTURES): Vit D, 25-Hydroxy: 57.5

## 2024-09-17 LAB — PROTEIN / CREATININE RATIO, URINE
Albumin, U: 0.7
Creatinine, Urine: 119.4

## 2024-09-17 LAB — MICROALBUMIN / CREATININE URINE RATIO: Microalb Creat Ratio: <30

## 2024-10-12 LAB — HM DIABETES EYE EXAM

## 2024-11-06 ENCOUNTER — Ambulatory Visit

## 2024-11-06 ENCOUNTER — Ambulatory Visit: Admitting: Family Medicine

## 2024-11-06 ENCOUNTER — Encounter: Payer: Self-pay | Admitting: Family Medicine

## 2024-11-06 VITALS — BP 118/67 | HR 71 | Ht 72.0 in | Wt 205.0 lb

## 2024-11-06 DIAGNOSIS — E1129 Type 2 diabetes mellitus with other diabetic kidney complication: Secondary | ICD-10-CM

## 2024-11-06 DIAGNOSIS — E790 Hyperuricemia without signs of inflammatory arthritis and tophaceous disease: Secondary | ICD-10-CM | POA: Diagnosis not present

## 2024-11-06 DIAGNOSIS — M25511 Pain in right shoulder: Secondary | ICD-10-CM

## 2024-11-06 DIAGNOSIS — R809 Proteinuria, unspecified: Secondary | ICD-10-CM

## 2024-11-06 DIAGNOSIS — H6123 Impacted cerumen, bilateral: Secondary | ICD-10-CM

## 2024-11-06 DIAGNOSIS — K648 Other hemorrhoids: Secondary | ICD-10-CM | POA: Diagnosis not present

## 2024-11-06 DIAGNOSIS — M25819 Other specified joint disorders, unspecified shoulder: Secondary | ICD-10-CM | POA: Diagnosis not present

## 2024-11-06 MED ORDER — MELOXICAM 15 MG PO TABS: ORAL_TABLET | ORAL | 0 refills | Status: AC

## 2024-11-06 NOTE — Assessment & Plan Note (Signed)
 Continue high fiber and fluids.  F/u with GI if not improving.
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
## 2024-11-06 NOTE — Assessment & Plan Note (Signed)
 Xrays of right shoulder ordered.  Adding meloxicam 15mg  daily x2 weeks then as needed.  Handout for home exercises provided. F/u in 6 weeks.

## 2024-11-06 NOTE — Assessment & Plan Note (Signed)
 Not currently taking allopurinol .  Update uric acid levels.

## 2024-11-06 NOTE — Patient Instructions (Addendum)
 Try voltaren  gel on toe and knees.   Work on home exercises for shoulder Take meloxicam x2 weeks then daily as needed.  Follow up in 6 weeks or sooner if worsening.

## 2024-11-06 NOTE — Assessment & Plan Note (Signed)
 Blood sugars remain well-controlled.  Now back into the prediabetic range.  Encouraged continued dietary changes.  Continue lisinopril  for management of microalbuminuria.

## 2024-11-06 NOTE — Assessment & Plan Note (Signed)
 Ear lavage completed today, tolerated well.

## 2024-11-07 LAB — URIC ACID: Uric Acid: 7.6 mg/dL (ref 3.8–8.4)

## 2024-11-20 ENCOUNTER — Ambulatory Visit: Payer: Self-pay | Admitting: Family Medicine

## 2025-04-13 ENCOUNTER — Ambulatory Visit
